# Patient Record
Sex: Female | Born: 1970
Health system: Southern US, Community
[De-identification: ages and names within clinical notes are randomized; demographics above are authoritative.]

## PROBLEM LIST (undated history)

## (undated) DIAGNOSIS — I2111 ST elevation (STEMI) myocardial infarction involving right coronary artery: Secondary | ICD-10-CM

## (undated) DIAGNOSIS — J302 Other seasonal allergic rhinitis: Secondary | ICD-10-CM

## (undated) DIAGNOSIS — M1990 Unspecified osteoarthritis, unspecified site: Secondary | ICD-10-CM

## (undated) DIAGNOSIS — I2510 Atherosclerotic heart disease of native coronary artery without angina pectoris: Secondary | ICD-10-CM

## (undated) DIAGNOSIS — I519 Heart disease, unspecified: Secondary | ICD-10-CM

## (undated) DIAGNOSIS — M51369 Other intervertebral disc degeneration, lumbar region without mention of lumbar back pain or lower extremity pain: Secondary | ICD-10-CM

## (undated) DIAGNOSIS — I10 Essential (primary) hypertension: Secondary | ICD-10-CM

## (undated) DIAGNOSIS — A4902 Methicillin resistant Staphylococcus aureus infection, unspecified site: Secondary | ICD-10-CM

## (undated) DIAGNOSIS — M5136 Other intervertebral disc degeneration, lumbar region: Secondary | ICD-10-CM

## (undated) DIAGNOSIS — J42 Unspecified chronic bronchitis: Secondary | ICD-10-CM

## (undated) DIAGNOSIS — E119 Type 2 diabetes mellitus without complications: Secondary | ICD-10-CM

## (undated) DIAGNOSIS — M352 Behcet's disease: Secondary | ICD-10-CM

## (undated) DIAGNOSIS — Z22322 Carrier or suspected carrier of Methicillin resistant Staphylococcus aureus: Secondary | ICD-10-CM

## (undated) DIAGNOSIS — K219 Gastro-esophageal reflux disease without esophagitis: Secondary | ICD-10-CM

## (undated) DIAGNOSIS — J189 Pneumonia, unspecified organism: Secondary | ICD-10-CM

## (undated) DIAGNOSIS — G43909 Migraine, unspecified, not intractable, without status migrainosus: Secondary | ICD-10-CM

## (undated) DIAGNOSIS — G8929 Other chronic pain: Secondary | ICD-10-CM

## (undated) DIAGNOSIS — I219 Acute myocardial infarction, unspecified: Secondary | ICD-10-CM

## (undated) DIAGNOSIS — E785 Hyperlipidemia, unspecified: Secondary | ICD-10-CM

## (undated) DIAGNOSIS — Z9981 Dependence on supplemental oxygen: Secondary | ICD-10-CM

## (undated) DIAGNOSIS — L0390 Cellulitis, unspecified: Secondary | ICD-10-CM

## (undated) DIAGNOSIS — M549 Dorsalgia, unspecified: Secondary | ICD-10-CM

## (undated) DIAGNOSIS — L88 Pyoderma gangrenosum: Secondary | ICD-10-CM

## (undated) HISTORY — PX: JOINT REPLACEMENT: SHX530

## (undated) HISTORY — PX: TONSILLECTOMY: SUR1361

## (undated) HISTORY — DX: Essential (primary) hypertension: I10

## (undated) HISTORY — PX: ECTOPIC PREGNANCY SURGERY: SHX613

## (undated) HISTORY — PX: TOTAL KNEE ARTHROPLASTY: SHX125

## (undated) HISTORY — DX: Methicillin resistant Staphylococcus aureus infection, unspecified site: A49.02

## (undated) HISTORY — DX: ST elevation (STEMI) myocardial infarction involving right coronary artery: I21.11

## (undated) HISTORY — DX: Hyperlipidemia, unspecified: E78.5

## (undated) HISTORY — DX: Atherosclerotic heart disease of native coronary artery without angina pectoris: I25.10

## (undated) HISTORY — DX: Carrier or suspected carrier of methicillin resistant Staphylococcus aureus: Z22.322

## (undated) HISTORY — DX: Heart disease, unspecified: I51.9

## (undated) HISTORY — PX: KNEE ARTHROSCOPY: SHX127

## (undated) HISTORY — PX: INCISION AND DRAINAGE: SHX5863

---

## 1999-11-12 ENCOUNTER — Encounter: Payer: Self-pay | Admitting: Pain Medicine

## 1999-11-12 ENCOUNTER — Ambulatory Visit (HOSPITAL_COMMUNITY): Admission: RE | Admit: 1999-11-12 | Discharge: 1999-11-12 | Payer: Self-pay | Admitting: Pain Medicine

## 2004-01-27 ENCOUNTER — Ambulatory Visit: Payer: Self-pay | Admitting: Pain Medicine

## 2004-02-24 ENCOUNTER — Ambulatory Visit: Payer: Self-pay | Admitting: Pain Medicine

## 2004-03-25 ENCOUNTER — Ambulatory Visit: Payer: Self-pay | Admitting: Pain Medicine

## 2004-04-27 ENCOUNTER — Ambulatory Visit: Payer: Self-pay | Admitting: Pain Medicine

## 2004-05-25 ENCOUNTER — Ambulatory Visit: Payer: Self-pay | Admitting: Pain Medicine

## 2004-06-24 ENCOUNTER — Ambulatory Visit: Payer: Self-pay | Admitting: Pain Medicine

## 2004-07-22 ENCOUNTER — Ambulatory Visit: Payer: Self-pay | Admitting: Pain Medicine

## 2004-08-24 ENCOUNTER — Ambulatory Visit: Payer: Self-pay | Admitting: Pain Medicine

## 2004-09-16 ENCOUNTER — Ambulatory Visit: Payer: Self-pay | Admitting: Pain Medicine

## 2004-10-21 ENCOUNTER — Ambulatory Visit: Payer: Self-pay | Admitting: Pain Medicine

## 2004-11-16 ENCOUNTER — Ambulatory Visit: Payer: Self-pay | Admitting: Pain Medicine

## 2004-12-07 ENCOUNTER — Ambulatory Visit: Payer: Self-pay | Admitting: Pain Medicine

## 2005-01-11 ENCOUNTER — Ambulatory Visit: Payer: Self-pay | Admitting: Pain Medicine

## 2005-02-08 ENCOUNTER — Ambulatory Visit: Payer: Self-pay | Admitting: Pain Medicine

## 2005-03-08 ENCOUNTER — Ambulatory Visit: Payer: Self-pay | Admitting: Pain Medicine

## 2005-03-16 ENCOUNTER — Ambulatory Visit: Payer: Self-pay | Admitting: Pain Medicine

## 2005-04-05 ENCOUNTER — Ambulatory Visit: Payer: Self-pay | Admitting: Pain Medicine

## 2005-04-18 HISTORY — PX: BILATERAL CARPAL TUNNEL RELEASE: SHX6508

## 2005-04-20 ENCOUNTER — Ambulatory Visit: Payer: Self-pay | Admitting: Pain Medicine

## 2005-05-26 ENCOUNTER — Ambulatory Visit: Payer: Self-pay | Admitting: Pain Medicine

## 2005-06-06 ENCOUNTER — Inpatient Hospital Stay: Payer: Self-pay | Admitting: Obstetrics and Gynecology

## 2005-06-28 ENCOUNTER — Ambulatory Visit: Payer: Self-pay | Admitting: Pain Medicine

## 2005-07-06 ENCOUNTER — Ambulatory Visit: Payer: Self-pay | Admitting: Pain Medicine

## 2005-08-02 ENCOUNTER — Ambulatory Visit: Payer: Self-pay | Admitting: Pain Medicine

## 2005-08-08 ENCOUNTER — Ambulatory Visit: Payer: Self-pay | Admitting: Pain Medicine

## 2005-08-30 ENCOUNTER — Ambulatory Visit: Payer: Self-pay | Admitting: Pain Medicine

## 2005-09-28 ENCOUNTER — Ambulatory Visit: Payer: Self-pay | Admitting: Neurosurgery

## 2005-09-29 ENCOUNTER — Ambulatory Visit: Payer: Self-pay | Admitting: Pain Medicine

## 2005-10-27 ENCOUNTER — Ambulatory Visit: Payer: Self-pay | Admitting: Pain Medicine

## 2005-11-11 ENCOUNTER — Inpatient Hospital Stay (HOSPITAL_COMMUNITY): Admission: RE | Admit: 2005-11-11 | Discharge: 2005-11-12 | Payer: Self-pay | Admitting: Neurosurgery

## 2005-12-07 ENCOUNTER — Encounter: Admission: RE | Admit: 2005-12-07 | Discharge: 2005-12-07 | Payer: Self-pay | Admitting: Neurosurgery

## 2006-01-31 ENCOUNTER — Ambulatory Visit: Payer: Self-pay | Admitting: Pain Medicine

## 2006-03-07 ENCOUNTER — Ambulatory Visit: Payer: Self-pay | Admitting: Pain Medicine

## 2006-03-28 ENCOUNTER — Ambulatory Visit: Payer: Self-pay | Admitting: Pain Medicine

## 2006-04-18 DIAGNOSIS — A4902 Methicillin resistant Staphylococcus aureus infection, unspecified site: Secondary | ICD-10-CM

## 2006-04-18 DIAGNOSIS — Z22322 Carrier or suspected carrier of Methicillin resistant Staphylococcus aureus: Secondary | ICD-10-CM

## 2006-04-18 HISTORY — PX: ANTERIOR CERVICAL DECOMP/DISCECTOMY FUSION: SHX1161

## 2006-04-18 HISTORY — DX: Methicillin resistant Staphylococcus aureus infection, unspecified site: A49.02

## 2006-04-18 HISTORY — DX: Carrier or suspected carrier of methicillin resistant Staphylococcus aureus: Z22.322

## 2006-05-01 ENCOUNTER — Ambulatory Visit: Payer: Self-pay | Admitting: Pain Medicine

## 2006-05-30 ENCOUNTER — Ambulatory Visit: Payer: Self-pay | Admitting: Pain Medicine

## 2006-06-29 ENCOUNTER — Ambulatory Visit: Payer: Self-pay | Admitting: Pain Medicine

## 2006-07-25 ENCOUNTER — Ambulatory Visit: Payer: Self-pay | Admitting: Pain Medicine

## 2006-08-04 IMAGING — RF DG CERVICAL SPINE 1V
1 series · 1 of 1 positions shown · non-contrast
Comparison: None

CLINICAL DATA: C5-C6, C6-C7 plate

CERVICAL SPINE - 1 VIEW

[Series 1: run · 1 of 1 slices shown]
[im 1/1]
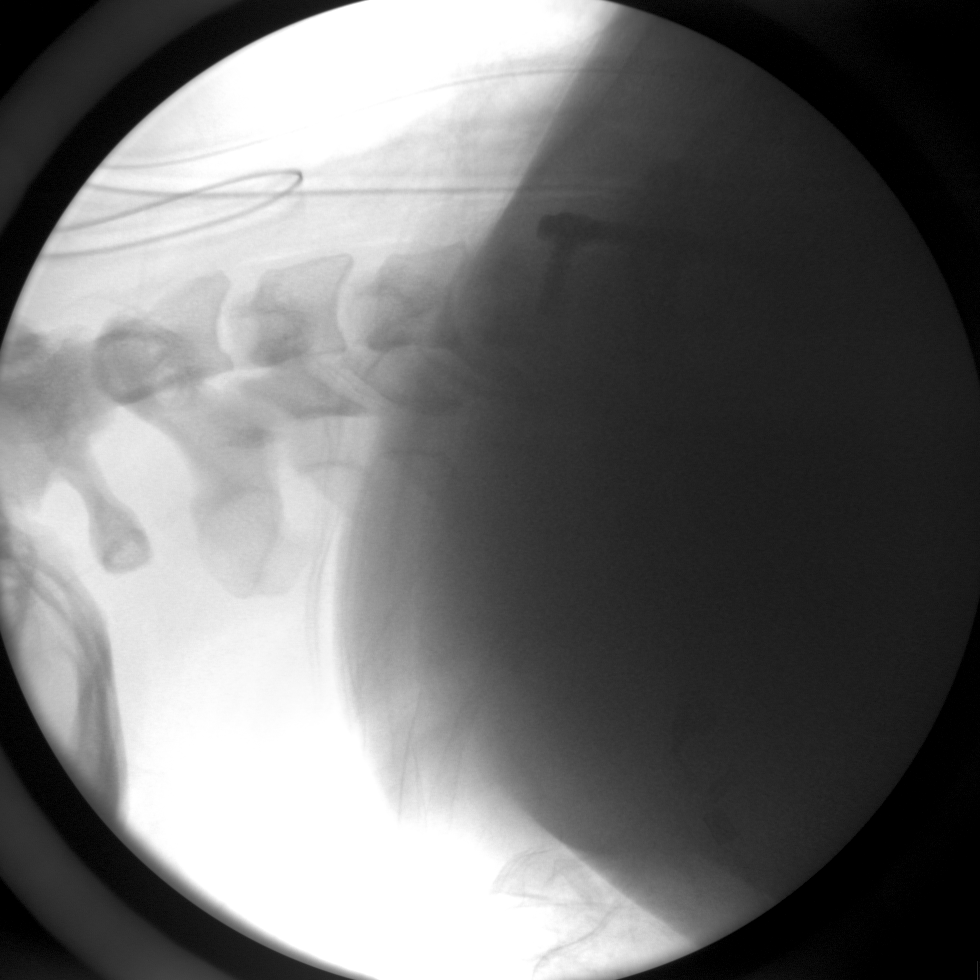

[1 of 1 positions shown; findings below may reference images not displayed]

FINDINGS: Patient status post anterior fusion. Plate is seen at C5 level. The
C6 and C7 level are obscured.

IMPRESSION

ACDF, presumably C5-C7. Below the C5 level is obscured by overlying shoulders.

## 2006-08-22 ENCOUNTER — Ambulatory Visit: Payer: Self-pay | Admitting: Pain Medicine

## 2006-08-29 ENCOUNTER — Ambulatory Visit (HOSPITAL_COMMUNITY): Admission: RE | Admit: 2006-08-29 | Discharge: 2006-08-29 | Payer: Self-pay | Admitting: Neurosurgery

## 2006-09-28 ENCOUNTER — Ambulatory Visit: Payer: Self-pay | Admitting: Pain Medicine

## 2006-10-31 ENCOUNTER — Ambulatory Visit: Payer: Self-pay | Admitting: Pain Medicine

## 2006-11-30 ENCOUNTER — Ambulatory Visit: Payer: Self-pay | Admitting: Pain Medicine

## 2006-12-19 ENCOUNTER — Ambulatory Visit: Payer: Self-pay | Admitting: Pain Medicine

## 2007-01-30 ENCOUNTER — Ambulatory Visit: Payer: Self-pay | Admitting: Pain Medicine

## 2007-03-01 ENCOUNTER — Ambulatory Visit: Payer: Self-pay | Admitting: Pain Medicine

## 2007-04-30 ENCOUNTER — Ambulatory Visit: Payer: Self-pay | Admitting: Pain Medicine

## 2007-05-29 ENCOUNTER — Ambulatory Visit: Payer: Self-pay | Admitting: Pain Medicine

## 2007-06-26 ENCOUNTER — Ambulatory Visit: Payer: Self-pay | Admitting: Pain Medicine

## 2007-07-24 ENCOUNTER — Ambulatory Visit: Payer: Self-pay | Admitting: Pain Medicine

## 2007-08-28 ENCOUNTER — Ambulatory Visit: Payer: Self-pay | Admitting: Pain Medicine

## 2007-09-27 ENCOUNTER — Ambulatory Visit: Payer: Self-pay | Admitting: Pain Medicine

## 2007-10-25 ENCOUNTER — Ambulatory Visit: Payer: Self-pay | Admitting: Pain Medicine

## 2007-11-27 ENCOUNTER — Ambulatory Visit: Payer: Self-pay | Admitting: Pain Medicine

## 2007-12-25 ENCOUNTER — Ambulatory Visit: Payer: Self-pay | Admitting: Pain Medicine

## 2008-01-22 ENCOUNTER — Ambulatory Visit: Payer: Self-pay | Admitting: Pain Medicine

## 2008-02-21 ENCOUNTER — Ambulatory Visit: Payer: Self-pay | Admitting: Pain Medicine

## 2008-03-20 ENCOUNTER — Ambulatory Visit: Payer: Self-pay | Admitting: Pain Medicine

## 2008-04-22 ENCOUNTER — Ambulatory Visit: Payer: Self-pay | Admitting: Pain Medicine

## 2008-05-22 ENCOUNTER — Ambulatory Visit: Payer: Self-pay | Admitting: Pain Medicine

## 2008-06-17 ENCOUNTER — Ambulatory Visit: Payer: Self-pay | Admitting: Pain Medicine

## 2008-07-17 ENCOUNTER — Ambulatory Visit: Payer: Self-pay | Admitting: Pain Medicine

## 2008-08-14 ENCOUNTER — Ambulatory Visit: Payer: Self-pay | Admitting: Pain Medicine

## 2008-09-11 ENCOUNTER — Ambulatory Visit: Payer: Self-pay | Admitting: Pain Medicine

## 2008-11-11 ENCOUNTER — Ambulatory Visit: Payer: Self-pay | Admitting: Pain Medicine

## 2008-12-11 ENCOUNTER — Ambulatory Visit: Payer: Self-pay | Admitting: Pain Medicine

## 2009-01-13 ENCOUNTER — Ambulatory Visit: Payer: Self-pay | Admitting: Pain Medicine

## 2009-02-12 ENCOUNTER — Ambulatory Visit: Payer: Self-pay | Admitting: Pain Medicine

## 2009-04-06 ENCOUNTER — Ambulatory Visit: Payer: Self-pay | Admitting: Pain Medicine

## 2009-06-04 ENCOUNTER — Ambulatory Visit: Payer: Self-pay | Admitting: Pain Medicine

## 2009-09-01 ENCOUNTER — Ambulatory Visit: Payer: Self-pay | Admitting: Pain Medicine

## 2009-10-01 ENCOUNTER — Ambulatory Visit: Payer: Self-pay | Admitting: Pain Medicine

## 2009-10-29 ENCOUNTER — Ambulatory Visit: Payer: Self-pay | Admitting: Pain Medicine

## 2009-12-28 ENCOUNTER — Ambulatory Visit: Payer: Self-pay | Admitting: Pain Medicine

## 2010-01-28 ENCOUNTER — Ambulatory Visit: Payer: Self-pay | Admitting: Pain Medicine

## 2010-04-27 ENCOUNTER — Ambulatory Visit: Payer: Self-pay | Admitting: Pain Medicine

## 2010-05-27 ENCOUNTER — Ambulatory Visit: Payer: Self-pay | Admitting: Pain Medicine

## 2010-06-24 ENCOUNTER — Ambulatory Visit: Payer: Self-pay | Admitting: Pain Medicine

## 2010-07-21 ENCOUNTER — Ambulatory Visit: Payer: Self-pay | Admitting: Pain Medicine

## 2010-09-21 ENCOUNTER — Ambulatory Visit: Payer: Self-pay | Admitting: Pain Medicine

## 2010-10-19 ENCOUNTER — Ambulatory Visit: Payer: Self-pay | Admitting: Pain Medicine

## 2010-11-18 ENCOUNTER — Ambulatory Visit: Payer: Self-pay | Admitting: Pain Medicine

## 2010-12-21 ENCOUNTER — Ambulatory Visit: Payer: Self-pay | Admitting: Pain Medicine

## 2011-01-18 ENCOUNTER — Ambulatory Visit: Payer: Self-pay | Admitting: Pain Medicine

## 2011-02-17 ENCOUNTER — Ambulatory Visit: Payer: Self-pay | Admitting: Pain Medicine

## 2011-03-17 ENCOUNTER — Ambulatory Visit: Payer: Self-pay | Admitting: Pain Medicine

## 2011-04-21 ENCOUNTER — Ambulatory Visit: Payer: Self-pay | Admitting: Pain Medicine

## 2011-05-19 ENCOUNTER — Ambulatory Visit: Payer: Self-pay | Admitting: Pain Medicine

## 2011-06-16 ENCOUNTER — Ambulatory Visit: Payer: Self-pay | Admitting: Pain Medicine

## 2011-07-14 ENCOUNTER — Ambulatory Visit: Payer: Self-pay | Admitting: Pain Medicine

## 2011-08-16 ENCOUNTER — Ambulatory Visit: Payer: Self-pay | Admitting: Pain Medicine

## 2011-09-08 ENCOUNTER — Ambulatory Visit: Payer: Self-pay | Admitting: Pain Medicine

## 2011-10-13 ENCOUNTER — Ambulatory Visit: Payer: Self-pay | Admitting: Pain Medicine

## 2011-11-10 ENCOUNTER — Ambulatory Visit: Payer: Self-pay | Admitting: Pain Medicine

## 2011-12-15 ENCOUNTER — Ambulatory Visit: Payer: Self-pay | Admitting: Pain Medicine

## 2012-01-17 ENCOUNTER — Ambulatory Visit: Payer: Self-pay | Admitting: Pain Medicine

## 2012-02-14 ENCOUNTER — Ambulatory Visit: Payer: Self-pay | Admitting: Pain Medicine

## 2012-03-19 ENCOUNTER — Ambulatory Visit: Payer: Self-pay | Admitting: Pain Medicine

## 2012-04-16 ENCOUNTER — Ambulatory Visit: Payer: Self-pay | Admitting: Pain Medicine

## 2012-05-17 ENCOUNTER — Ambulatory Visit: Payer: Self-pay | Admitting: Pain Medicine

## 2012-06-14 ENCOUNTER — Ambulatory Visit: Payer: Self-pay | Admitting: Pain Medicine

## 2012-07-12 ENCOUNTER — Ambulatory Visit: Payer: Self-pay | Admitting: Pain Medicine

## 2012-08-09 ENCOUNTER — Ambulatory Visit: Payer: Self-pay | Admitting: Pain Medicine

## 2012-09-11 ENCOUNTER — Ambulatory Visit: Payer: Self-pay | Admitting: Pain Medicine

## 2012-10-11 ENCOUNTER — Ambulatory Visit: Payer: Self-pay | Admitting: Pain Medicine

## 2012-11-06 ENCOUNTER — Ambulatory Visit: Payer: Self-pay | Admitting: Pain Medicine

## 2013-01-08 ENCOUNTER — Ambulatory Visit: Payer: Self-pay | Admitting: Pain Medicine

## 2013-02-07 ENCOUNTER — Ambulatory Visit: Payer: Self-pay | Admitting: Pain Medicine

## 2013-03-07 ENCOUNTER — Ambulatory Visit: Payer: Self-pay | Admitting: Pain Medicine

## 2013-04-04 ENCOUNTER — Ambulatory Visit: Payer: Self-pay | Admitting: Pain Medicine

## 2013-05-06 ENCOUNTER — Ambulatory Visit: Payer: Self-pay | Admitting: Pain Medicine

## 2013-06-06 ENCOUNTER — Ambulatory Visit: Payer: Self-pay | Admitting: Pain Medicine

## 2013-07-04 ENCOUNTER — Ambulatory Visit: Payer: Self-pay | Admitting: Pain Medicine

## 2013-08-06 ENCOUNTER — Ambulatory Visit: Payer: Self-pay | Admitting: Pain Medicine

## 2013-09-05 ENCOUNTER — Ambulatory Visit: Payer: Self-pay | Admitting: Pain Medicine

## 2013-10-03 ENCOUNTER — Ambulatory Visit: Payer: Self-pay | Admitting: Pain Medicine

## 2013-10-31 ENCOUNTER — Ambulatory Visit: Payer: Self-pay | Admitting: Pain Medicine

## 2013-12-05 ENCOUNTER — Ambulatory Visit: Payer: Self-pay | Admitting: Pain Medicine

## 2014-01-01 ENCOUNTER — Ambulatory Visit: Payer: Self-pay | Admitting: Pain Medicine

## 2014-02-03 ENCOUNTER — Ambulatory Visit: Payer: Self-pay | Admitting: Pain Medicine

## 2014-03-06 ENCOUNTER — Ambulatory Visit: Payer: Self-pay | Admitting: Pain Medicine

## 2014-04-01 ENCOUNTER — Ambulatory Visit: Payer: Self-pay | Admitting: Pain Medicine

## 2014-05-06 ENCOUNTER — Ambulatory Visit: Payer: Self-pay | Admitting: Pain Medicine

## 2014-05-06 DIAGNOSIS — M47812 Spondylosis without myelopathy or radiculopathy, cervical region: Secondary | ICD-10-CM | POA: Diagnosis not present

## 2014-05-06 DIAGNOSIS — M5416 Radiculopathy, lumbar region: Secondary | ICD-10-CM | POA: Diagnosis not present

## 2014-05-06 DIAGNOSIS — M47817 Spondylosis without myelopathy or radiculopathy, lumbosacral region: Secondary | ICD-10-CM | POA: Diagnosis not present

## 2014-05-06 DIAGNOSIS — M533 Sacrococcygeal disorders, not elsewhere classified: Secondary | ICD-10-CM | POA: Diagnosis not present

## 2014-07-03 ENCOUNTER — Ambulatory Visit: Payer: Self-pay | Admitting: Pain Medicine

## 2014-07-03 DIAGNOSIS — M47812 Spondylosis without myelopathy or radiculopathy, cervical region: Secondary | ICD-10-CM | POA: Diagnosis not present

## 2014-07-03 DIAGNOSIS — M47817 Spondylosis without myelopathy or radiculopathy, lumbosacral region: Secondary | ICD-10-CM | POA: Diagnosis not present

## 2014-07-03 DIAGNOSIS — M533 Sacrococcygeal disorders, not elsewhere classified: Secondary | ICD-10-CM | POA: Diagnosis not present

## 2014-07-03 DIAGNOSIS — M5416 Radiculopathy, lumbar region: Secondary | ICD-10-CM | POA: Diagnosis not present

## 2014-08-31 ENCOUNTER — Encounter: Payer: Self-pay | Admitting: Pain Medicine

## 2014-08-31 DIAGNOSIS — M5134 Other intervertebral disc degeneration, thoracic region: Secondary | ICD-10-CM | POA: Insufficient documentation

## 2014-08-31 DIAGNOSIS — M48062 Spinal stenosis, lumbar region with neurogenic claudication: Secondary | ICD-10-CM

## 2014-08-31 DIAGNOSIS — M961 Postlaminectomy syndrome, not elsewhere classified: Secondary | ICD-10-CM

## 2014-08-31 DIAGNOSIS — M5030 Other cervical disc degeneration, unspecified cervical region: Secondary | ICD-10-CM

## 2014-08-31 DIAGNOSIS — M51369 Other intervertebral disc degeneration, lumbar region without mention of lumbar back pain or lower extremity pain: Secondary | ICD-10-CM

## 2014-08-31 DIAGNOSIS — M5136 Other intervertebral disc degeneration, lumbar region: Secondary | ICD-10-CM | POA: Insufficient documentation

## 2014-09-01 ENCOUNTER — Ambulatory Visit: Payer: Medicaid Other | Attending: Pain Medicine | Admitting: Pain Medicine

## 2014-09-01 ENCOUNTER — Encounter: Payer: Self-pay | Admitting: Pain Medicine

## 2014-09-01 VITALS — HR 96 | Temp 98.7°F | Resp 17 | Ht 61.0 in | Wt 215.0 lb

## 2014-09-01 DIAGNOSIS — M5022 Other cervical disc displacement, mid-cervical region: Secondary | ICD-10-CM | POA: Diagnosis not present

## 2014-09-01 DIAGNOSIS — Z8614 Personal history of Methicillin resistant Staphylococcus aureus infection: Secondary | ICD-10-CM | POA: Insufficient documentation

## 2014-09-01 DIAGNOSIS — M5136 Other intervertebral disc degeneration, lumbar region: Secondary | ICD-10-CM | POA: Diagnosis not present

## 2014-09-01 DIAGNOSIS — M961 Postlaminectomy syndrome, not elsewhere classified: Secondary | ICD-10-CM

## 2014-09-01 DIAGNOSIS — M47817 Spondylosis without myelopathy or radiculopathy, lumbosacral region: Secondary | ICD-10-CM | POA: Diagnosis not present

## 2014-09-01 DIAGNOSIS — R52 Pain, unspecified: Secondary | ICD-10-CM | POA: Diagnosis present

## 2014-09-01 DIAGNOSIS — M48061 Spinal stenosis, lumbar region without neurogenic claudication: Secondary | ICD-10-CM

## 2014-09-01 DIAGNOSIS — M5030 Other cervical disc degeneration, unspecified cervical region: Secondary | ICD-10-CM

## 2014-09-01 DIAGNOSIS — M533 Sacrococcygeal disorders, not elsewhere classified: Secondary | ICD-10-CM | POA: Diagnosis not present

## 2014-09-01 DIAGNOSIS — A4902 Methicillin resistant Staphylococcus aureus infection, unspecified site: Secondary | ICD-10-CM | POA: Insufficient documentation

## 2014-09-01 DIAGNOSIS — M47812 Spondylosis without myelopathy or radiculopathy, cervical region: Secondary | ICD-10-CM | POA: Diagnosis not present

## 2014-09-01 DIAGNOSIS — M47816 Spondylosis without myelopathy or radiculopathy, lumbar region: Secondary | ICD-10-CM

## 2014-09-01 DIAGNOSIS — M5416 Radiculopathy, lumbar region: Secondary | ICD-10-CM | POA: Diagnosis not present

## 2014-09-01 MED ORDER — MORPHINE SULFATE ER 60 MG PO TBCR: EXTENDED_RELEASE_TABLET | ORAL | Status: DC

## 2014-09-01 MED ORDER — OXYCODONE HCL 20 MG PO TABS: ORAL_TABLET | ORAL | Status: DC

## 2014-09-01 NOTE — Progress Notes (Signed)
   Subjective:    Patient ID: Brittney Banks, female    DOB: 1970/04/25, 44 y.o.   MRN: [id number]  HPI  Patient is 44 year old female [facility] and treatment of pain involving multiple regions with recent MRSA infection. Patient with history of MRSA infection and recent exacerbation of her MRSA. We advised patient follow-up with Dr. Manson Passey at this time and to reconsider infectious disease consultation as well the patient was understanding and will follow-up Dr. Manson Passey this week as discussed. We will avoid interventional treatment and patient also will is unable to undergo surgery of the lumbar region with Dr. Dutch Quint due to her MRSA infection. Patient with prior surgery of the cervical region. Patient will follow up with Dr. Manson Passey as discussed and will call [facility] should there be any change in plan. Understanding and in agreement to treatment plan  Review of Systems     Objective:   Physical Exam   There was severe tenderness of the paraspinal musculature region cervical region with tenderness over the trapezius levator scapula and rhomboid musculature regions and there was limited range of motion of the cervical spine. A solution of the acromioclavicular glenohumeral joint regions with tenderness to palpation with decreased grip strength noted of the upper extremities palpation of the thoracic facet thoracic paraspinal musculature region reproduced pain of moderate degree with moderate tenderness over the lumbar paraspinal musculature region lumbar facet region as well as the gluteal and piriformis musculature regions. Straight leg raising limited to approximately 20 evidence of lesions of the extremities noted EHL strength decreased with tenderness of the greater trochanteric region and iliotibial band region noted as well. Abdomen tends to palpation rebound tenderness noted and no costovertebral angle tenderness noted     Assessment &  Plan:    Degenerative disc disease cervical spine C5-6 C6-7 degenerative changes with annular disc bulging and asymmetric bulging toward the right and associated endplate bar formation resulting in narrowing of the neural foramen at several levels   Degenerative changes lumbar spine Facet hypertrophy, ligamentum flavum hypertrophy spinal stenosis  MRSA    Plan  Continue present medications oxycodone OxyContin.  F/U PCP for evaliation of  BP and general medical  condition.. Patient to see Dr. Manson Passey this week for evaluation of recent MRSA infection and consider infectious disease consultation   F/U surgical evaluation.  F/U nrurological evaluation.  May consider radiofrequency rhizolysis or intraspinal procedures pending response to present treatment and F/U evaluation.  [facility] to call [facility] should patient have concerns prior to scheduled return appointment.

## 2014-09-01 NOTE — Patient Instructions (Addendum)
Continue present medications.  F/U PCP for evaliation of  BP and general medical  condition and MRSA. Please see Dr. Manson Passey this week  F/U surgical evaluation.  F/U nrurological evaluation.  May consider radiofrequency rhizolysis or intraspinal procedures pending response to present treatment and F/U evaluation.  [facility] to call [facility] should patient have concerns prior to scheduled return appointment.

## 2014-09-01 NOTE — Progress Notes (Signed)
   Subjective:    Patient ID: Brittney Banks, female    DOB: [birth year], 44 y.o.   MRN: [id number]  HPI    Review of Systems     Objective:   Physical Exam        Assessment & Plan:

## 2014-09-01 NOTE — Progress Notes (Signed)
DISCHARGE HOME AT 1506 HRS PT AMBULATORY TEACH BACK 3 DONE SCRIPTS OF MS CONTIN 60MG  AND OXYCODONE 20MG  GIVEN RETURN IN ONE MONTH FOR EVAL

## 2014-09-09 ENCOUNTER — Other Ambulatory Visit: Payer: Self-pay | Admitting: Pain Medicine

## 2014-10-02 ENCOUNTER — Encounter: Payer: Medicare Other | Admitting: Pain Medicine

## 2014-10-02 ENCOUNTER — Other Ambulatory Visit: Payer: Self-pay | Admitting: Pain Medicine

## 2014-10-02 ENCOUNTER — Telehealth: Payer: Self-pay | Admitting: Pain Medicine

## 2014-10-02 MED ORDER — MORPHINE SULFATE ER 60 MG PO TBCR: EXTENDED_RELEASE_TABLET | ORAL | Status: DC

## 2014-10-02 MED ORDER — OXYCODONE HCL 20 MG PO TABS: ORAL_TABLET | ORAL | Status: DC

## 2014-10-02 NOTE — Telephone Encounter (Signed)
Pt has been notified.

## 2014-10-02 NOTE — Telephone Encounter (Signed)
Tea is running a fever and has new breakouts do you still want her to come in or can her mother pick up scripts this afternoon ?

## 2014-10-02 NOTE — Telephone Encounter (Signed)
Brittney Banks, and Nurses  Patient may have a responsible adult, and pick up her prescriptions today. Also instructed patient to see her primary care physician today for evaluation of her medical condition if patient has not already seen her primary care physician.

## 2014-10-09 ENCOUNTER — Other Ambulatory Visit: Payer: Self-pay | Admitting: Pain Medicine

## 2014-10-29 ENCOUNTER — Encounter: Payer: Self-pay | Admitting: Pain Medicine

## 2014-10-29 ENCOUNTER — Ambulatory Visit: Payer: Medicaid Other | Attending: Pain Medicine | Admitting: Pain Medicine

## 2014-10-29 VITALS — BP 156/91 | HR 91 | Temp 98.3°F | Resp 16 | Ht 69.0 in | Wt 212.0 lb

## 2014-10-29 DIAGNOSIS — M5481 Occipital neuralgia: Secondary | ICD-10-CM | POA: Insufficient documentation

## 2014-10-29 DIAGNOSIS — M79602 Pain in left arm: Secondary | ICD-10-CM | POA: Diagnosis present

## 2014-10-29 DIAGNOSIS — Z9889 Other specified postprocedural states: Secondary | ICD-10-CM | POA: Diagnosis not present

## 2014-10-29 DIAGNOSIS — M47812 Spondylosis without myelopathy or radiculopathy, cervical region: Secondary | ICD-10-CM | POA: Diagnosis not present

## 2014-10-29 DIAGNOSIS — M533 Sacrococcygeal disorders, not elsewhere classified: Secondary | ICD-10-CM | POA: Diagnosis not present

## 2014-10-29 DIAGNOSIS — M79601 Pain in right arm: Secondary | ICD-10-CM | POA: Diagnosis present

## 2014-10-29 DIAGNOSIS — M5134 Other intervertebral disc degeneration, thoracic region: Secondary | ICD-10-CM

## 2014-10-29 DIAGNOSIS — M47817 Spondylosis without myelopathy or radiculopathy, lumbosacral region: Secondary | ICD-10-CM | POA: Diagnosis not present

## 2014-10-29 DIAGNOSIS — M5416 Radiculopathy, lumbar region: Secondary | ICD-10-CM | POA: Diagnosis not present

## 2014-10-29 DIAGNOSIS — M961 Postlaminectomy syndrome, not elsewhere classified: Secondary | ICD-10-CM

## 2014-10-29 DIAGNOSIS — M542 Cervicalgia: Secondary | ICD-10-CM | POA: Diagnosis present

## 2014-10-29 DIAGNOSIS — A4902 Methicillin resistant Staphylococcus aureus infection, unspecified site: Secondary | ICD-10-CM

## 2014-10-29 DIAGNOSIS — M4806 Spinal stenosis, lumbar region: Secondary | ICD-10-CM | POA: Diagnosis not present

## 2014-10-29 DIAGNOSIS — M5136 Other intervertebral disc degeneration, lumbar region: Secondary | ICD-10-CM | POA: Diagnosis not present

## 2014-10-29 DIAGNOSIS — M51369 Other intervertebral disc degeneration, lumbar region without mention of lumbar back pain or lower extremity pain: Secondary | ICD-10-CM

## 2014-10-29 DIAGNOSIS — M48062 Spinal stenosis, lumbar region with neurogenic claudication: Secondary | ICD-10-CM

## 2014-10-29 DIAGNOSIS — M5030 Other cervical disc degeneration, unspecified cervical region: Secondary | ICD-10-CM | POA: Diagnosis not present

## 2014-10-29 MED ORDER — MORPHINE SULFATE ER 60 MG PO TBCR: EXTENDED_RELEASE_TABLET | ORAL | Status: DC

## 2014-10-29 MED ORDER — OXYCODONE HCL 20 MG PO TABS: ORAL_TABLET | ORAL | Status: DC

## 2014-10-29 NOTE — Progress Notes (Signed)
   Subjective:    Patient ID: Brittney Banks, female    DOB: 1971/03/05, 44 y.o.   MRN: [id number]  HPI Patient is a 44 year old female [facility] further evaluation and treatment of pain involving the neck upper extremity regions mid back lower back and lower extremity regions. Patient denies any recent trauma. Patient states that she has had reoccurrence of MRSA outbreak. Patient is being evaluated and treated by Dr. Manson Passey and is being seen by home healthcare nurse at her residence. Patient will continue antibiotic treatment and other treatment at this time. We will also discussed patient proceeding with infectious disease consult. Patient will address this with Dr. Manson Passey and consider infectious disease consult as we discussed. Patient denies any other changes in condition. We will continue MS Contin and oxycodone at the present time. Patient states that the present medication regimen is adequate for controlling her pain. Patient is understanding and agreement status treatment plan.      Review of Systems     Objective:   Physical Exam  There was tenderness of the splenius capitis and occipitalis musculature region of mild to moderate degree. Patient appeared to be with unremarkable Spurling's maneuver. There was well-healed surgical scar of the cervical region without increased warmth or erythema in the region of the scar. Patient was with unremarkable Spurling's maneuver. Palpation over the thoracic facet thoracic paraspinal musculature region was a tends to palpation. No crepitus of the thoracic region was noted. Tinel and Phalen's maneuver were without increase of pain of significant degree. No definite sensory deficit of dermatomal distribution of the upper extremities noted. Palpation over the lumbar paraspinal muscle lumbar facet region associated with moderate moderately severe discomfort. There was tenderness over the greater trochanteric region iliotibial band  region. Straight leg raising tolerated to  approximately 10.  Negative clonus negative Homans. Patient with evidence of lesions of the extremities and trunk on today's visit. Abdomen with without excessive tends to palpation. No costovertebral angle tenderness noted.      Assessment & Plan:   Degenerative disc disease cervical spine Status post surgery cervical region  Degenerative disc disease lumbar spine Lumbar facet syndrome Lumbar stenosis  Sacroiliac joint dysfunction  Bilateral occipital neuralgia  MRSA   Plan   Continue present medications MS Contin and oxycodone  F/U PCP Dr. Manson Passey for evaliation of  BP MRSA and general medical  condition and to discuss infectious disease consultation  F/U surgical evaluation Dr. Dutch Quint for neurosurgical reevaluation as needed  F/U neurological evaluation  May consider radiofrequency rhizolysis or intraspinal procedures pending response to present treatment and F/U evaluation.  [facility] to [facility] should patient have concerns prior to scheduled return appointment.

## 2014-10-29 NOTE — Patient Instructions (Addendum)
Continue present medications MS Contin and oxycodone   F/U PCP Dr. Manson Passey for evaliation of  BP, MRSA, and general medical  condition as discussed and consider infectious disease consultation as discussed. Continue home health care nursing for treatment of MRSA as presently doing  F/U surgical evaluation  F/U neurological evaluation  May consider radiofrequency rhizolysis or intraspinal procedures pending response to present treatment and F/U evaluation.  [facility] to call [facility] should patient have concerns prior to scheduled return appointment.

## 2014-10-29 NOTE — Progress Notes (Signed)
Safety precautions to be maintained throughout the outpatient stay will include: orient to surroundings, keep bed in low position, maintain call bell within reach at all times, provide assistance with transfer out of bed and ambulation.  

## 2014-10-29 NOTE — Progress Notes (Signed)
Discharged to home ambulatory with script in hand for oxycodone and MS Contin.  Follow up in 1 month.

## 2014-11-13 ENCOUNTER — Other Ambulatory Visit: Payer: Self-pay | Admitting: Pain Medicine

## 2014-12-02 ENCOUNTER — Encounter: Payer: Medicare Other | Admitting: Pain Medicine

## 2014-12-02 ENCOUNTER — Other Ambulatory Visit: Payer: Self-pay | Admitting: Pain Medicine

## 2014-12-02 ENCOUNTER — Telehealth: Payer: Self-pay | Admitting: Pain Medicine

## 2014-12-02 MED ORDER — MORPHINE SULFATE ER 60 MG PO TBCR: EXTENDED_RELEASE_TABLET | ORAL | Status: DC

## 2014-12-02 MED ORDER — OXYCODONE HCL 20 MG PO TABS: ORAL_TABLET | ORAL | Status: DC

## 2014-12-02 NOTE — Telephone Encounter (Signed)
patient states that she is having some issues with the new antibiotic she has been prescribed. It has caused nausea and some vommittng. She has already contacted her primary care and they have prescribed her some phenergan. She states there are no problems with the medications Dr. Metta Clines prescribes. Can patient's family member pick up scripts today?

## 2014-12-02 NOTE — Telephone Encounter (Signed)
Pt has an apt today , but has a bad outbreak out now and having medication complications. Would like to know if her mother can pick up her scripts today.

## 2014-12-02 NOTE — Telephone Encounter (Signed)
Nurses and [name] Please call patient and explained to me what patient means by " medication complications" before we prescribed medications today. Yes the patient's mother may pick up the morphine sulfate extended-release and oxycodone prescriptions if there are no medication complications related to these medications. Patient should see Dr. Manson Passey her primary care physician immediately for evaluation of her condition of medication complications

## 2014-12-02 NOTE — Telephone Encounter (Signed)
Patient called and informed that scripts are ready to be picked up.

## 2015-01-01 ENCOUNTER — Encounter: Payer: Self-pay | Admitting: Pain Medicine

## 2015-01-01 ENCOUNTER — Ambulatory Visit: Payer: Medicaid Other | Attending: Pain Medicine | Admitting: Pain Medicine

## 2015-01-01 VITALS — BP 141/88 | HR 94 | Temp 96.6°F | Resp 16 | Ht 69.0 in | Wt 212.0 lb

## 2015-01-01 DIAGNOSIS — M79602 Pain in left arm: Secondary | ICD-10-CM | POA: Diagnosis present

## 2015-01-01 DIAGNOSIS — M542 Cervicalgia: Secondary | ICD-10-CM | POA: Diagnosis present

## 2015-01-01 DIAGNOSIS — M47812 Spondylosis without myelopathy or radiculopathy, cervical region: Secondary | ICD-10-CM | POA: Diagnosis not present

## 2015-01-01 DIAGNOSIS — M5416 Radiculopathy, lumbar region: Secondary | ICD-10-CM | POA: Diagnosis not present

## 2015-01-01 DIAGNOSIS — M5481 Occipital neuralgia: Secondary | ICD-10-CM | POA: Insufficient documentation

## 2015-01-01 DIAGNOSIS — A4902 Methicillin resistant Staphylococcus aureus infection, unspecified site: Secondary | ICD-10-CM

## 2015-01-01 DIAGNOSIS — M5136 Other intervertebral disc degeneration, lumbar region: Secondary | ICD-10-CM | POA: Diagnosis not present

## 2015-01-01 DIAGNOSIS — M48062 Spinal stenosis, lumbar region with neurogenic claudication: Secondary | ICD-10-CM

## 2015-01-01 DIAGNOSIS — Z8614 Personal history of Methicillin resistant Staphylococcus aureus infection: Secondary | ICD-10-CM | POA: Diagnosis not present

## 2015-01-01 DIAGNOSIS — M533 Sacrococcygeal disorders, not elsewhere classified: Secondary | ICD-10-CM | POA: Diagnosis not present

## 2015-01-01 DIAGNOSIS — M5030 Other cervical disc degeneration, unspecified cervical region: Secondary | ICD-10-CM | POA: Diagnosis not present

## 2015-01-01 DIAGNOSIS — Z9889 Other specified postprocedural states: Secondary | ICD-10-CM | POA: Insufficient documentation

## 2015-01-01 DIAGNOSIS — M961 Postlaminectomy syndrome, not elsewhere classified: Secondary | ICD-10-CM

## 2015-01-01 DIAGNOSIS — M48061 Spinal stenosis, lumbar region without neurogenic claudication: Secondary | ICD-10-CM

## 2015-01-01 DIAGNOSIS — M47817 Spondylosis without myelopathy or radiculopathy, lumbosacral region: Secondary | ICD-10-CM | POA: Diagnosis not present

## 2015-01-01 DIAGNOSIS — M4806 Spinal stenosis, lumbar region: Secondary | ICD-10-CM | POA: Insufficient documentation

## 2015-01-01 DIAGNOSIS — M79601 Pain in right arm: Secondary | ICD-10-CM | POA: Diagnosis present

## 2015-01-01 DIAGNOSIS — M5134 Other intervertebral disc degeneration, thoracic region: Secondary | ICD-10-CM

## 2015-01-01 DIAGNOSIS — M47816 Spondylosis without myelopathy or radiculopathy, lumbar region: Secondary | ICD-10-CM

## 2015-01-01 DIAGNOSIS — M51369 Other intervertebral disc degeneration, lumbar region without mention of lumbar back pain or lower extremity pain: Secondary | ICD-10-CM

## 2015-01-01 MED ORDER — OXYCODONE HCL 20 MG PO TABS: ORAL_TABLET | ORAL | Status: DC

## 2015-01-01 MED ORDER — MORPHINE SULFATE ER 60 MG PO TBCR: EXTENDED_RELEASE_TABLET | ORAL | Status: DC

## 2015-01-01 NOTE — Patient Instructions (Signed)
PLAN   Continue present medication oxycodone and MS Contin   F/U PCP Dr. Manson Passey  for evaliation of MRSA, infectious disease consultation, BP, and general medical  condition  F/U surgical evaluation. Follow-up with Dr. Dutch Quint as discussed  F/U neurological evaluation. May consider pending follow-up evaluations  May consider radiofrequency rhizolysis or intraspinal procedures pending response to present treatment and F/U evaluation   [facility] to call [facility] should patient have concerns prior to scheduled return appointment.

## 2015-01-01 NOTE — Progress Notes (Signed)
Safety precautions to be maintained throughout the outpatient stay will include: orient to surroundings, keep bed in low position, maintain call bell within reach at all times, provide assistance with transfer out of bed and ambulation.  

## 2015-01-01 NOTE — Progress Notes (Signed)
   Subjective:    Patient ID: Brittney Banks, female    DOB: 01-06-1971, 44 y.o.   MRN: [id number]  HPI  Patient 44 year old female [facility] further evaluation and treatment of pain involving neck upper extremity regions lower back and lower extremity regions. Patient is with prior surgical intervention of the cervical region. Patient is unable to undergo surgery of the lumbar region due to reoccurring MRSA infections. Patient will follow-up with Dr. Dutch Quint as discussed and will follow-up with Dr. Manson Passey primary care physician to consider infectious disease consultation as discussed with patient and patient's mother on today's visit. We will continue oxycodone and MS Contin as prescribed at this time. Patient was understanding and agreed suggested treatment plan.     Review of Systems     Objective:   Physical Exam  There was tends to palpation of the splenius capitis and occipitalis musculature region with well-healed surgical scar the cervical region without increased warmth or erythema the scar. There was decreased grip strength with unremarkable Spurling's maneuver. Palpation of the cervical facet cervical paraspinal musculature and thoracic facet thoracic paraspinal musculature region reproduced moderate discomfort. There was limited range of motion of the cervical spine noted. Palpation of the acromial clavicular and glenohumeral joint regions reproduced mild discomfort. Palpation over the thoracic facet thoracic paraspinal musculature region was without crepitus of the thoracic region noted. Palpation over the lumbar paraspinal muscle lumbar facet region associated with moderate to marked severe discomfort. There was increased pain with lateral bending rotation and extension and palpation of the lumbar facets. Straight leg raising was tolerates approximately 20 without increased pain with dorsiflexion noted. There was negative clonus negative Homans. No definite  sensory deficit of dermatomal distribution was detected. EHL strength was decreased. There was tenderness to palpation of the lower extremities. With lesion of the lower extremity on the right noted. There was negative clonus negative Homans. There was tends on the greater trochanteric region iliotibial band region of moderate degree as well as the PSIS and PII S region right side greater than the left side. There was negative clonus negative Homans abdomen was without significant tends to palpation and no costovertebral tenderness was noted.     Assessment & Plan:    Degenerative disc disease cervical spine Status post surgery cervical region  Degenerative disc disease lumbar spine Lumbar facet syndrome Lumbar stenosis  Sacroiliac joint dysfunction  Bilateral occipital neuralgia  MRSA    PLAN   Continue present medication oxycodone and MS Contin Dr. Manson Passey  F/U PCP for evaliation of MRSA, BP, general medical  Condition, and to discuss infectious disease consultation  Infectious disease consultation. Patient will address with Dr. Manson Passey as we discussed on today's visit  F/U surgical evaluation.. Patient will follow-up with Dr. Dutch Quint and this regard as needed  F/U neurological evaluation. May consider pending follow-up evaluations  May consider radiofrequency rhizolysis or intraspinal procedures pending response to present treatment and F/U evaluation   [facility] to call [facility] should patient have concerns prior to scheduled return appointment.

## 2015-01-29 ENCOUNTER — Ambulatory Visit: Payer: Medicaid Other | Attending: Pain Medicine | Admitting: Pain Medicine

## 2015-01-29 ENCOUNTER — Encounter: Payer: Self-pay | Admitting: Pain Medicine

## 2015-01-29 VITALS — BP 151/104 | HR 105 | Temp 98.8°F | Resp 16 | Ht 69.5 in | Wt 210.0 lb

## 2015-01-29 DIAGNOSIS — M4806 Spinal stenosis, lumbar region: Secondary | ICD-10-CM | POA: Insufficient documentation

## 2015-01-29 DIAGNOSIS — M533 Sacrococcygeal disorders, not elsewhere classified: Secondary | ICD-10-CM | POA: Insufficient documentation

## 2015-01-29 DIAGNOSIS — B9562 Methicillin resistant Staphylococcus aureus infection as the cause of diseases classified elsewhere: Secondary | ICD-10-CM | POA: Diagnosis not present

## 2015-01-29 DIAGNOSIS — Z9889 Other specified postprocedural states: Secondary | ICD-10-CM | POA: Insufficient documentation

## 2015-01-29 DIAGNOSIS — M5416 Radiculopathy, lumbar region: Secondary | ICD-10-CM | POA: Diagnosis not present

## 2015-01-29 DIAGNOSIS — M5481 Occipital neuralgia: Secondary | ICD-10-CM | POA: Insufficient documentation

## 2015-01-29 DIAGNOSIS — M5136 Other intervertebral disc degeneration, lumbar region: Secondary | ICD-10-CM | POA: Diagnosis not present

## 2015-01-29 DIAGNOSIS — M545 Low back pain: Secondary | ICD-10-CM | POA: Diagnosis present

## 2015-01-29 DIAGNOSIS — M51369 Other intervertebral disc degeneration, lumbar region without mention of lumbar back pain or lower extremity pain: Secondary | ICD-10-CM

## 2015-01-29 DIAGNOSIS — A4902 Methicillin resistant Staphylococcus aureus infection, unspecified site: Secondary | ICD-10-CM

## 2015-01-29 DIAGNOSIS — M48062 Spinal stenosis, lumbar region with neurogenic claudication: Secondary | ICD-10-CM

## 2015-01-29 DIAGNOSIS — M5030 Other cervical disc degeneration, unspecified cervical region: Secondary | ICD-10-CM

## 2015-01-29 DIAGNOSIS — M47816 Spondylosis without myelopathy or radiculopathy, lumbar region: Secondary | ICD-10-CM

## 2015-01-29 DIAGNOSIS — M961 Postlaminectomy syndrome, not elsewhere classified: Secondary | ICD-10-CM

## 2015-01-29 DIAGNOSIS — M542 Cervicalgia: Secondary | ICD-10-CM | POA: Diagnosis present

## 2015-01-29 DIAGNOSIS — M48061 Spinal stenosis, lumbar region without neurogenic claudication: Secondary | ICD-10-CM

## 2015-01-29 DIAGNOSIS — M47817 Spondylosis without myelopathy or radiculopathy, lumbosacral region: Secondary | ICD-10-CM | POA: Diagnosis not present

## 2015-01-29 DIAGNOSIS — M5134 Other intervertebral disc degeneration, thoracic region: Secondary | ICD-10-CM

## 2015-01-29 DIAGNOSIS — M47812 Spondylosis without myelopathy or radiculopathy, cervical region: Secondary | ICD-10-CM | POA: Diagnosis not present

## 2015-01-29 MED ORDER — MORPHINE SULFATE ER 60 MG PO TBCR: EXTENDED_RELEASE_TABLET | ORAL | Status: DC

## 2015-01-29 MED ORDER — OXYCODONE HCL 20 MG PO TABS: ORAL_TABLET | ORAL | Status: DC

## 2015-01-29 NOTE — Patient Instructions (Addendum)
PLAN   Continue present medication oxycodone and MS Contin  F/U PCP Dr. Manson Passey for evaliation of  BP and general medical  condition. Blood pressure is elevated please follow-up with Dr. Manson Passey as discussed  F/U surgical evaluation. May consider pending follow-up evaluations  F/U neurological evaluation. May consider pending follow-up evaluations  Infectious disease evaluation as discussed  May consider radiofrequency rhizolysis or intraspinal procedures pending response to present treatment and F/U evaluation   Patient to call [facility] should patient have concerns prior to scheduled return appointment.  A prescription for OXYCODONE, MS CONTIN was given to you today.

## 2015-01-29 NOTE — Progress Notes (Signed)
Safety precautions to be maintained throughout the outpatient stay will include: orient to surroundings, keep bed in low position, maintain call bell within reach at all times, provide assistance with transfer out of bed and ambulation.  

## 2015-01-29 NOTE — Progress Notes (Signed)
Subjective:    Patient ID: Brittney Banks, female    DOB: 03-21-1971, 44 y.o.   MRN: [id number]  HPI  Patient is 44 year old female [facility] further evaluation and treatment of pain involving the neck upper extremity regions lower back and lower extremity regions. The patient is status post surgical intervention of the cervical region and was undergo surgical intervention of the lumbar region by Dr. Dutch Quint. The patient has been unable to undergo surgery of the lumbar region due to recurring MRSA infections. We discussed patient's condition on today's visit and decision was made to have patient follow-up with Dr. Manson Passey and to proceed with infectious disease evaluation. The patient will undergo follow-up evaluation with infectious disease physician in [place] as planned pending consent of Dr. Manson Passey. We will continue present medications of oxycodone and MS Contin which patient states controls pain rather well. We will remain available to consider modification of treatment pending follow-up evaluation. The patient is with another outbreak of MRSA at this time involving the lower extremity on the left predominantly. All understanding and in agreement status treatment plan     Review of Systems     Objective:   Physical Exam  There was tends to palpation of the splenius capitis Cipro talus musculature region of mild degree. There was well-healed surgical scar the cervical region without increased warmth or erythema in the region of the scar. There was limited range of motion of the cervical spine with unremarkable Spurling's maneuver. Palpation of the acromioclavicular and glenohumeral joint regions reproduced mild discomfort. Patient was with decreased grip strength noted. Tinel and Phalen's maneuver without increased pain of significant degree. Palpation over the thoracic facet thoracic paraspinal musculature associated with moderate discomfort. No crepitus of the thoracic  region was noted. Palpation the lumbar paraspinal muscles lumbar facet region associated with moderate moderate severe discomfort. There was tenderness of the PSIS PII S region greater trochanteric region and iliotibial band region palpation of the gluteal and piriformis musculature regions reproduced moderate discomfort as well. Leg raising limited to 20 without increased pain with dorsiflexion noted. Region was with evidence of lesions of the left lower extremity. Lesions were noted below the level of the knee. There was decreased EHL strength. No definite sensory deficit of dermatomal description was detected. There was negative clonus negative Homans. Abdomen was with mild tends to palpation and no costovertebral angle tenderness was noted.      Assessment & Plan:     Degenerative disc disease cervical spine Status post surgery cervical region  Degenerative disc disease lumbar spine Lumbar facet syndrome Lumbar stenosis  Sacroiliac joint dysfunction  Bilateral occipital neuralgia  MRSA (Patient with reoccurrence of MRSA infection at this time)    PLAN   Continue present medication oxycodone and MS Contin  F/U PCP Dr. Manson Passey to discuss referral to infectious disease physician in [facility] which we located today and provided name and telephone number of the infectious disease physician. The patient will follow-up with Dr. Manson Passey for evaluation of for the patient for evaliation of  BP and general medical  condition as discussed. Patient's blood pressure was noted to be elevated. Patient was aware of elevated blood pressure and will follow-up with Dr. Manson Passey as discussed   F/U surgical evaluation. Patient will follow-up with Dr. Dutch Quint as discussed  F/U neurological evaluation. May consider pending follow-up evaluations  Infectious disease evaluation as discussed. Patient will discussed the name of the infectious disease physician which we  located in [facility] and we'll proceed with evaluation pending discussion with Dr. Manson Passey  May consider radiofrequency rhizolysis or intraspinal procedures pending response to present treatment and F/U evaluation . We will avoid considering interventional treatment and patient at this time due to recurring MRSA infection  [facility] to call [facility] should patient have concerns prior to scheduled return appointment.

## 2015-03-02 ENCOUNTER — Telehealth: Payer: Self-pay | Admitting: *Deleted

## 2015-03-02 ENCOUNTER — Ambulatory Visit: Payer: Medicare Other | Admitting: Pain Medicine

## 2015-03-02 ENCOUNTER — Other Ambulatory Visit: Payer: Self-pay | Admitting: Pain Medicine

## 2015-03-02 ENCOUNTER — Telehealth: Payer: Self-pay | Admitting: Pain Medicine

## 2015-03-02 MED ORDER — MORPHINE SULFATE ER 60 MG PO TBCR: EXTENDED_RELEASE_TABLET | ORAL | Status: DC

## 2015-03-02 MED ORDER — OXYCODONE HCL 20 MG PO TABS: ORAL_TABLET | ORAL | Status: DC

## 2015-03-02 NOTE — Telephone Encounter (Signed)
Brittney Banks Patient may have someone (mother) pickup medications due to her severe MRSA infection

## 2015-03-02 NOTE — Telephone Encounter (Signed)
meds due 03-04-15. Dr.

## 2015-03-02 NOTE — Telephone Encounter (Signed)
Pt called and informed scripts are up front and ready for pickup by Mother.

## 2015-03-02 NOTE — Telephone Encounter (Signed)
Has severe break out of mrsa / needs meds but cannot come in / could her mom pick up scripts until till she gets this under control please

## 2015-03-02 NOTE — Telephone Encounter (Signed)
Dr. Metta Clines              Please advise if you will write script for patient to pick up. Thank you.

## 2015-03-30 ENCOUNTER — Encounter: Payer: Medicare Other | Admitting: Pain Medicine

## 2015-04-01 ENCOUNTER — Encounter: Payer: Medicare Other | Admitting: Pain Medicine

## 2015-04-01 ENCOUNTER — Ambulatory Visit: Payer: Medicaid Other | Attending: Pain Medicine | Admitting: Pain Medicine

## 2015-04-01 ENCOUNTER — Encounter: Payer: Self-pay | Admitting: Pain Medicine

## 2015-04-01 VITALS — BP 148/86 | HR 100 | Temp 98.3°F | Resp 15 | Ht 69.0 in | Wt 210.0 lb

## 2015-04-01 DIAGNOSIS — M5030 Other cervical disc degeneration, unspecified cervical region: Secondary | ICD-10-CM

## 2015-04-01 DIAGNOSIS — M5136 Other intervertebral disc degeneration, lumbar region: Secondary | ICD-10-CM | POA: Diagnosis not present

## 2015-04-01 DIAGNOSIS — M5416 Radiculopathy, lumbar region: Secondary | ICD-10-CM | POA: Diagnosis not present

## 2015-04-01 DIAGNOSIS — M51369 Other intervertebral disc degeneration, lumbar region without mention of lumbar back pain or lower extremity pain: Secondary | ICD-10-CM

## 2015-04-01 DIAGNOSIS — M542 Cervicalgia: Secondary | ICD-10-CM | POA: Diagnosis present

## 2015-04-01 DIAGNOSIS — M47817 Spondylosis without myelopathy or radiculopathy, lumbosacral region: Secondary | ICD-10-CM | POA: Diagnosis not present

## 2015-04-01 DIAGNOSIS — M47812 Spondylosis without myelopathy or radiculopathy, cervical region: Secondary | ICD-10-CM | POA: Diagnosis not present

## 2015-04-01 DIAGNOSIS — M533 Sacrococcygeal disorders, not elsewhere classified: Secondary | ICD-10-CM | POA: Insufficient documentation

## 2015-04-01 DIAGNOSIS — M48062 Spinal stenosis, lumbar region with neurogenic claudication: Secondary | ICD-10-CM

## 2015-04-01 DIAGNOSIS — M4806 Spinal stenosis, lumbar region: Secondary | ICD-10-CM | POA: Insufficient documentation

## 2015-04-01 DIAGNOSIS — M48061 Spinal stenosis, lumbar region without neurogenic claudication: Secondary | ICD-10-CM

## 2015-04-01 DIAGNOSIS — M47816 Spondylosis without myelopathy or radiculopathy, lumbar region: Secondary | ICD-10-CM

## 2015-04-01 DIAGNOSIS — Z9889 Other specified postprocedural states: Secondary | ICD-10-CM | POA: Insufficient documentation

## 2015-04-01 DIAGNOSIS — M5481 Occipital neuralgia: Secondary | ICD-10-CM | POA: Diagnosis not present

## 2015-04-01 DIAGNOSIS — A4902 Methicillin resistant Staphylococcus aureus infection, unspecified site: Secondary | ICD-10-CM

## 2015-04-01 DIAGNOSIS — M961 Postlaminectomy syndrome, not elsewhere classified: Secondary | ICD-10-CM

## 2015-04-01 DIAGNOSIS — M546 Pain in thoracic spine: Secondary | ICD-10-CM | POA: Diagnosis present

## 2015-04-01 DIAGNOSIS — M5134 Other intervertebral disc degeneration, thoracic region: Secondary | ICD-10-CM

## 2015-04-01 MED ORDER — MORPHINE SULFATE ER 60 MG PO TBCR: EXTENDED_RELEASE_TABLET | ORAL | Status: DC

## 2015-04-01 MED ORDER — OXYCODONE HCL 20 MG PO TABS: ORAL_TABLET | ORAL | Status: DC

## 2015-04-01 NOTE — Patient Instructions (Addendum)
PLAN   Continue present medication oxycodone and MS Contin  F/U PCP Dr. Manson Passey for evaliation of  BP MRSA and general medical  condition.   F/U surgical evaluation. May consider pending follow-up evaluations  F/U neurological evaluation. May consider pending follow-up evaluations  Infectious disease evaluation as discussed. Ask receptionist date of your infectious disease consultation  May consider radiofrequency rhizolysis or intraspinal procedures pending response to present treatment and F/U evaluation   [facility] to call [facility] should patient have concerns prior to scheduled return appointment.

## 2015-04-01 NOTE — Progress Notes (Signed)
Safety precautions to be maintained throughout the outpatient stay will include: orient to surroundings, keep bed in low position, maintain call bell within reach at all times, provide assistance with transfer out of bed and ambulation.  

## 2015-04-01 NOTE — Progress Notes (Signed)
   Subjective:    Patient ID: Brittney Banks, female    DOB: 06-May-1970, 44 y.o.   MRN: [id number]  HPI  The patient is a 44 year old female who returns to pain management Center for further evaluation and treatment of pain involving the region of the neck entire back upper and lower extremity regions. The patient is status post surgical intervention of the cervical region and was planning to undergo surgical intervention of the lumbar region which was counseled due to reoccurring MRSA infections. At the present time patient continues under care of her primary care physician Dr. Manson Passey. We discuss further evaluation including infectious disease consultation. Patient states that Dr. Manson Passey wishes for [place] to refer patient to infectious disease follow-up with Dr. Manson Passey continues to treat patient's MRSA and continues to treat patient's general medical conditions. We will continue medications as prescribed. Patient states that medications are covering her pain adequately. The patient will also follow Dr. Dutch Quint neurosurgeon as discussed and we will remain available to consider this moderate patient treatment pending follow-up evaluation. The patient was understanding and agreed to suggested treatment plan     Review of Systems     Objective:   Physical Exam There was tennis to palpation over the cervical facet cervical paraspinal musculature region with patient being with decreased grip strength. Tinel and Phalen's maneuver were without increased pain of significant degree. There appeared to be unremarkable Spurling's maneuver. Palpation over the thoracic facet thoracic paraspinal musculature region reproduced pain of moderate degree. There was moderate muscle spasms noted in the thoracic region without crepitus of the thoracic region noted. Palpation over the lumbar paraspinal musculature region lumbar facet region was with moderate to severe discomfort. Lateral bending rotation extension and palpation of the  lumbar facets reproduced moderately severe discomfort. Straight leg leg raising was limited to approximately 20. EHL strength appeared to be decreased there was negative clonus negative Homans. There was moderate to moderately severe tends to palpation of the PSIS and PII S region as well as the greater trochanteric region. The knees were with moderate tenderness to palpation with negative anterior and posterior drawer signs without ballottement of the patella. There was question decreased sensation along the L5 dermatomal distribution there was negative clonus negative Homans. Abdomen was nontender and no costovertebral maintenance noted       Assessment & Plan:    Degenerative disc disease cervical spine Status post surgery cervical region  Degenerative disc disease lumbar spine Lumbar facet syndrome Lumbar stenosis  Sacroiliac joint dysfunction  Bilateral occipital neuralgia  MRSA     PLAN   Continue present medication oxycodone and MS Contin  F/U PCP Dr. Manson Passey for evaliation of  BP MRSA and general medical  condition.   F/U surgical evaluation. The patient will follow-up with Dr. Dutch Quint in this regard  F/U neurological evaluation. May consider pending follow-up evaluations  Infectious disease evaluation as discussed. Ask receptionist date of your infectious disease consultation  May consider radiofrequency rhizolysis or intraspinal procedures pending response to present treatment and F/U evaluation   Patient to call Pain Management Center should patient have concerns prior to scheduled return appointment

## 2015-04-30 ENCOUNTER — Telehealth: Payer: Self-pay | Admitting: Pain Medicine

## 2015-04-30 ENCOUNTER — Ambulatory Visit: Payer: Medicare Other | Admitting: Pain Medicine

## 2015-04-30 MED ORDER — MORPHINE SULFATE ER 60 MG PO TBCR: EXTENDED_RELEASE_TABLET | ORAL | Status: DC

## 2015-04-30 MED ORDER — OXYCODONE HCL 20 MG PO TABS: ORAL_TABLET | ORAL | Status: DC

## 2015-04-30 NOTE — Telephone Encounter (Signed)
error 

## 2015-04-30 NOTE — Telephone Encounter (Signed)
Left voicemail that Rx are ready.

## 2015-05-01 ENCOUNTER — Other Ambulatory Visit: Payer: Self-pay | Admitting: Pain Medicine

## 2015-05-07 NOTE — Progress Notes (Signed)
Patient did not come for appointment due to reoccurring MRSA infection. Prescriptions were provided for patient and were picked up by patient's family There was no charge for the patient since patient was not seen and was with reoccurring MRSA infection

## 2015-06-01 ENCOUNTER — Ambulatory Visit: Payer: Medicaid Other | Attending: Pain Medicine | Admitting: Pain Medicine

## 2015-06-01 ENCOUNTER — Encounter: Payer: Self-pay | Admitting: Pain Medicine

## 2015-06-01 VITALS — BP 161/98 | HR 106 | Temp 98.5°F | Resp 16 | Wt 215.0 lb

## 2015-06-01 DIAGNOSIS — M48062 Spinal stenosis, lumbar region with neurogenic claudication: Secondary | ICD-10-CM

## 2015-06-01 DIAGNOSIS — M5416 Radiculopathy, lumbar region: Secondary | ICD-10-CM | POA: Diagnosis not present

## 2015-06-01 DIAGNOSIS — Z9889 Other specified postprocedural states: Secondary | ICD-10-CM | POA: Diagnosis not present

## 2015-06-01 DIAGNOSIS — M542 Cervicalgia: Secondary | ICD-10-CM | POA: Diagnosis present

## 2015-06-01 DIAGNOSIS — M5136 Other intervertebral disc degeneration, lumbar region: Secondary | ICD-10-CM

## 2015-06-01 DIAGNOSIS — M79606 Pain in leg, unspecified: Secondary | ICD-10-CM | POA: Diagnosis present

## 2015-06-01 DIAGNOSIS — M5481 Occipital neuralgia: Secondary | ICD-10-CM | POA: Diagnosis not present

## 2015-06-01 DIAGNOSIS — M4806 Spinal stenosis, lumbar region: Secondary | ICD-10-CM | POA: Diagnosis not present

## 2015-06-01 DIAGNOSIS — M5030 Other cervical disc degeneration, unspecified cervical region: Secondary | ICD-10-CM | POA: Diagnosis not present

## 2015-06-01 DIAGNOSIS — M48061 Spinal stenosis, lumbar region without neurogenic claudication: Secondary | ICD-10-CM

## 2015-06-01 DIAGNOSIS — M47812 Spondylosis without myelopathy or radiculopathy, cervical region: Secondary | ICD-10-CM | POA: Diagnosis not present

## 2015-06-01 DIAGNOSIS — Z8614 Personal history of Methicillin resistant Staphylococcus aureus infection: Secondary | ICD-10-CM | POA: Diagnosis not present

## 2015-06-01 DIAGNOSIS — M961 Postlaminectomy syndrome, not elsewhere classified: Secondary | ICD-10-CM

## 2015-06-01 DIAGNOSIS — A4902 Methicillin resistant Staphylococcus aureus infection, unspecified site: Secondary | ICD-10-CM

## 2015-06-01 DIAGNOSIS — M47816 Spondylosis without myelopathy or radiculopathy, lumbar region: Secondary | ICD-10-CM

## 2015-06-01 DIAGNOSIS — M533 Sacrococcygeal disorders, not elsewhere classified: Secondary | ICD-10-CM | POA: Insufficient documentation

## 2015-06-01 DIAGNOSIS — M47817 Spondylosis without myelopathy or radiculopathy, lumbosacral region: Secondary | ICD-10-CM | POA: Diagnosis not present

## 2015-06-01 DIAGNOSIS — M546 Pain in thoracic spine: Secondary | ICD-10-CM | POA: Diagnosis present

## 2015-06-01 DIAGNOSIS — M5134 Other intervertebral disc degeneration, thoracic region: Secondary | ICD-10-CM

## 2015-06-01 MED ORDER — MORPHINE SULFATE ER 60 MG PO TBCR: EXTENDED_RELEASE_TABLET | ORAL | Status: DC

## 2015-06-01 MED ORDER — OXYCODONE HCL 20 MG PO TABS: ORAL_TABLET | ORAL | Status: DC

## 2015-06-01 NOTE — Progress Notes (Signed)
Safety precautions to be maintained throughout the outpatient stay will include: orient to surroundings, keep bed in low position, maintain call bell within reach at all times, provide assistance with transfer out of bed and ambulation.  

## 2015-06-01 NOTE — Progress Notes (Signed)
   Subjective:    Patient ID: Brittney Banks, female    DOB: 10/09/1970, 45 y.o.   MRN: [id number]  HPI  Patient is a 45 year old female who returns to pain management for further evaluation and treatment of pain involving the neck entire back upper and lower extremity regions. The patient is status post surgical intervention of the cervical region and is with out plans for surgery of the lumbar region due to recurring MRSA infections. We discussed patient's condition and have advised patient to proceed with infectious disease consultation as has been recommended and has been scheduled for patient previously. The patient will continue care up Dr. Manson Passey at this time. We will continue presently prescribed medications. Patient states that medications are sufficient for controlling pain. We will continue presently prescribed medications and will have patient return to Dr. Manson Passey to discuss referral to infectious disease physician as has previously been discussed on several occasions. The patient is with understanding and in agreement with suggested treatment plan the patient continues home health care assistance with treatment of her lesions. The patient will call pain management should they be change in condition prior to scheduled return appointment  Review of Systems     Objective:   Physical Exam  There was tenderness to palpation of paraspinal misreading cervical region cervical facet region a moderate degree severe degree with well-healed surgical scar of the cervical region without increased warmth and erythema in the region of the scar. The patient was with decreased grip strength and Tinel and Phalen's maneuver were without increase of pain of significant degree. The patient was with unremarkable Spurling's maneuver. There was tenderness over the thoracic facet thoracic paraspinal musculature region a moderate degree with moderate muscle spasms noted in the thoracic region palpation over the lumbar  paraspinal must reason lumbar facet region was with moderately severe discomfort with lateral bending rotation extension and palpation of the lumbar facets reproducing moderately severe discomfort. There was tenderness over the PSIS and PII S region a moderate degree with mild tenderness to moderate tenderness of the gluteal and piriformis musculature region a moderate tenderness of the greater trochanteric region and iliotibial band region. Straight leg raising was limited to 20 without increased pain with dorsiflexion noted. EHL strength appeared to be decreased without a definite sensory deficit of dermatomal distribution detected. There was negative clonus negative Homans Abdomen nontender with no costovertebral angle tenderness noted            Assessment & Plan:      Degenerative disc disease cervical spine Status post surgery cervical region  Degenerative disc disease lumbar spine Lumbar facet syndrome Lumbar stenosis  Sacroiliac joint dysfunction  Bilateral occipital neuralgia  MRSA     PLAN   Continue present medication oxycodone and MS Contin  F/U PCP Dr. Manson Passey for evaliation of  BP MRSA and general medical  condition.   F/U surgical evaluation. May consider pending follow-up evaluations  F/U neurological evaluation. May consider pending follow-up evaluations  Infectious disease evaluation as discussed.   May consider radiofrequency rhizolysis or intraspinal procedures pending response to present treatment and F/U evaluation . We will avoid such procedures at this time  Patient to call Pain Management Center should patient have concerns prior to scheduled return appointment.

## 2015-06-01 NOTE — Patient Instructions (Signed)
PLAN   Continue present medication oxycodone and MS Contin  F/U PCP Dr. Manson Passey for evaliation of  BP MRSA and general medical  condition.   F/U surgical evaluation. May consider pending follow-up evaluations  F/U neurological evaluation. May consider pending follow-up evaluations  Infectious disease evaluation as discussed.   May consider radiofrequency rhizolysis or intraspinal procedures pending response to present treatment and F/U evaluation . We will avoid such procedures at this time  Patient to call Pain Management Center should patient have concerns prior to scheduled return appointment.

## 2015-06-29 ENCOUNTER — Telehealth: Payer: Self-pay | Admitting: Pain Medicine

## 2015-06-29 ENCOUNTER — Other Ambulatory Visit: Payer: Self-pay | Admitting: Pain Medicine

## 2015-06-29 ENCOUNTER — Ambulatory Visit: Payer: Medicare Other | Admitting: Pain Medicine

## 2015-06-29 MED ORDER — MORPHINE SULFATE ER 60 MG PO TBCR: EXTENDED_RELEASE_TABLET | ORAL | Status: DC

## 2015-06-29 MED ORDER — OXYCODONE HCL 20 MG PO TABS: ORAL_TABLET | ORAL | Status: DC

## 2015-06-30 NOTE — Telephone Encounter (Signed)
error 

## 2015-07-29 ENCOUNTER — Encounter: Payer: Self-pay | Admitting: Pain Medicine

## 2015-07-29 ENCOUNTER — Ambulatory Visit: Payer: Medicaid Other | Attending: Pain Medicine | Admitting: Pain Medicine

## 2015-07-29 VITALS — BP 158/80 | HR 108 | Temp 98.6°F | Resp 18 | Ht 69.5 in | Wt 210.0 lb

## 2015-07-29 DIAGNOSIS — M4806 Spinal stenosis, lumbar region: Secondary | ICD-10-CM | POA: Insufficient documentation

## 2015-07-29 DIAGNOSIS — M5416 Radiculopathy, lumbar region: Secondary | ICD-10-CM | POA: Diagnosis not present

## 2015-07-29 DIAGNOSIS — M48062 Spinal stenosis, lumbar region with neurogenic claudication: Secondary | ICD-10-CM

## 2015-07-29 DIAGNOSIS — M542 Cervicalgia: Secondary | ICD-10-CM | POA: Diagnosis present

## 2015-07-29 DIAGNOSIS — M533 Sacrococcygeal disorders, not elsewhere classified: Secondary | ICD-10-CM | POA: Insufficient documentation

## 2015-07-29 DIAGNOSIS — Z8614 Personal history of Methicillin resistant Staphylococcus aureus infection: Secondary | ICD-10-CM | POA: Insufficient documentation

## 2015-07-29 DIAGNOSIS — M51369 Other intervertebral disc degeneration, lumbar region without mention of lumbar back pain or lower extremity pain: Secondary | ICD-10-CM

## 2015-07-29 DIAGNOSIS — Z9889 Other specified postprocedural states: Secondary | ICD-10-CM | POA: Diagnosis not present

## 2015-07-29 DIAGNOSIS — M545 Low back pain: Secondary | ICD-10-CM | POA: Diagnosis not present

## 2015-07-29 DIAGNOSIS — M961 Postlaminectomy syndrome, not elsewhere classified: Secondary | ICD-10-CM | POA: Diagnosis not present

## 2015-07-29 DIAGNOSIS — M5481 Occipital neuralgia: Secondary | ICD-10-CM | POA: Diagnosis not present

## 2015-07-29 DIAGNOSIS — M47812 Spondylosis without myelopathy or radiculopathy, cervical region: Secondary | ICD-10-CM | POA: Diagnosis not present

## 2015-07-29 DIAGNOSIS — A4902 Methicillin resistant Staphylococcus aureus infection, unspecified site: Secondary | ICD-10-CM | POA: Diagnosis not present

## 2015-07-29 DIAGNOSIS — M79606 Pain in leg, unspecified: Secondary | ICD-10-CM | POA: Diagnosis present

## 2015-07-29 DIAGNOSIS — M5030 Other cervical disc degeneration, unspecified cervical region: Secondary | ICD-10-CM | POA: Diagnosis not present

## 2015-07-29 DIAGNOSIS — M5136 Other intervertebral disc degeneration, lumbar region: Secondary | ICD-10-CM | POA: Diagnosis not present

## 2015-07-29 DIAGNOSIS — M5134 Other intervertebral disc degeneration, thoracic region: Secondary | ICD-10-CM | POA: Diagnosis not present

## 2015-07-29 DIAGNOSIS — M47816 Spondylosis without myelopathy or radiculopathy, lumbar region: Secondary | ICD-10-CM

## 2015-07-29 DIAGNOSIS — M47817 Spondylosis without myelopathy or radiculopathy, lumbosacral region: Secondary | ICD-10-CM | POA: Diagnosis not present

## 2015-07-29 DIAGNOSIS — M546 Pain in thoracic spine: Secondary | ICD-10-CM | POA: Diagnosis present

## 2015-07-29 MED ORDER — OXYCODONE HCL 20 MG PO TABS: ORAL_TABLET | ORAL | Status: DC

## 2015-07-29 MED ORDER — MORPHINE SULFATE ER 60 MG PO TBCR: EXTENDED_RELEASE_TABLET | ORAL | Status: DC

## 2015-07-29 NOTE — Patient Instructions (Addendum)
[facility] Discharge Instructions  General Discharge Instructions :  If you need to reach your doctor call: Monday-Friday 8:00 am - 4:00 pm at [phone] or toll free [phone].  After clinic hours [phone] to have operator reach doctor.  Bring all of your medication bottles to all your appointments in the pain clinic.  To cancel or reschedule your appointment with Pain Management please remember to call 24 hours in advance to avoid a fee.  Refer to the educational materials which you have been given on: General Risks, I had my Procedure. Discharge Instructions, Post Sedation.  Post Procedure Instructions:  The drugs you were given will stay in your system until tomorrow, so for the next 24 hours you should not drive, make any legal decisions or drink any alcoholic beverages.  You may eat anything you prefer, but it is better to start with liquids then soups and crackers, and gradually work up to solid foods.  Please notify your doctor immediately if you have any unusual bleeding, trouble breathing or pain that is not related to your normal pain.  Depending on the type of procedure that was done, some parts of your body may feel week and/or numb.  This usually clears up by tonight or the next day.  Walk with the use of an assistive device or accompanied by an adult for the 24 hours.  You may use ice on the affected area for the first 24 hours.  Put ice in a Ziploc bag and cover with a towel and place against area 15 minutes on 15 minutes off.  You may switch to heat after 24 hours. 

## 2015-07-29 NOTE — Progress Notes (Signed)
Subjective:    Patient ID: Brittney Banks, female    DOB: 1970-11-28, 45 y.o.   MRN: [id number]  HPI  The patient is a 45 year old female who returns to pain management for further evaluation and treatment of pain involving the neck entire back upper and lower extremity region. The patient is with reoccurring MRSA infections and is with pain at the present time. Well controlled with MS Contin and oxycodone. The patient is status post surgical intervention cervical region performed by Dr. Dutch Quint with surgery of the lumbar region counseled due to reoccurring MRSA infections. The patient states that she is with pain fairly well-controlled at the present time and is without reoccurrence of MRSA infection at the present time. The patient states that pain involving the neck and upper extremity regions is associated with weakness of the right upper extremity compared to the left upper extremity. The patient is without significant progression of weakness of the upper extremity are pain involving the cervical region. The patient is with pain of the lumbar lower extremity region and is with weakness of the lower extremities as well without significant progression of weakness. We discussed patient's condition on today's visit and patient will follow-up with Dr. Victoriano Lain discussed exercise program and modifications of activities to avoid progression of patient's symptoms, minimize severity of patient's symptoms, and we will remain available to consider modifications of treatment regimen pending follow-up evaluations the patient pending recommendations of Dr. as well. The patient was in agreement with suggested treatment plan continue MS Contin and oxycodone as prescribed at this time.  Review of Systems     Objective:   Physical Exam  There was tenderness to palpation of the splenius capitis and occipitalis musculature regions palpation which be produced moderate discomfort with well-healed surgical scar of the  cervical region without increased warmth or erythema in the region of the scar. There was decreased grip strength on the right compared to the left. Tinel and Phalen's maneuver were without increase of pain of significant degree. Palpation over the thoracic region thoracic facet region was attends to palpation with moderate muscle spasms noted in thoracic paraspinal musculature region and upper mid lower thoracic paraspinal musculature region. No crepitus of the thoracic region was noted. There was tenderness of the acromioclavicular and glenohumeral joint regions of mild to moderate degree. The patient was with tenderness to palpation over the lumbar paraspinal must reason lumbar facet region a moderate degree with lateral bending rotation extension and palpation the lumbar facets reproducing moderate discomfort. Straight leg raising was tolerates approximately 20 without a definite increase of pain with dorsiflexion noted. There was tends to palpation of the knees with negative anterior and posterior drawer signs without ballottement of the patella. There was tenderness along the PSIS and PII S region as well as the greater trochanteric region and iliotibial band region a moderate degree. There was negative clonus negative Homans. Abdomen nontender and no costovertebral angle tenderness noted.          Assessment & Plan:     Degenerative disc disease cervical spine Status post surgery cervical region  Degenerative disc disease lumbar spine Lumbar facet syndrome Lumbar stenosis  Sacroiliac joint dysfunction  Bilateral occipital neuralgia  MRSA      PLAN   Continue present medications MS Contin and oxycodone  F/U PCP for evaliation of  BP and general medical condition and to address infectious disease consultation as discussed.  F/U surgical evaluation. The patient will follow-up with Dr. Dutch Quint as discussed.  We will also discuss patient undergoing evaluation with Dr. Dutch Quint for  recommendations regarding body mechanics and exercise program to help improve patient's condition and retard progression of patient's condition   F/U neurological evaluation. We will avoid further neurological evaluation at this time  May consider radiofrequency rhizolysis or intraspinal procedures pending response to present treatment and F/U evaluation.. We will avoid considering interventional treatment for this patient  Patient to call Pain Management Center should patient have concerns prior to scheduled return appointment

## 2015-08-04 LAB — TOXASSURE SELECT 13 (MW), URINE: PDF: 0

## 2015-08-04 NOTE — Progress Notes (Signed)
Quick Note:  Reviewed. ______ 

## 2015-08-04 NOTE — Progress Notes (Signed)
Quick Note:  Please File. ______

## 2015-08-27 ENCOUNTER — Encounter: Payer: Self-pay | Admitting: Pain Medicine

## 2015-08-27 ENCOUNTER — Ambulatory Visit: Payer: Medicare Other | Attending: Pain Medicine | Admitting: Pain Medicine

## 2015-08-27 VITALS — BP 142/88 | HR 116 | Temp 98.7°F | Resp 18 | Ht 69.0 in | Wt 215.0 lb

## 2015-08-27 DIAGNOSIS — M47817 Spondylosis without myelopathy or radiculopathy, lumbosacral region: Secondary | ICD-10-CM | POA: Diagnosis not present

## 2015-08-27 DIAGNOSIS — M5030 Other cervical disc degeneration, unspecified cervical region: Secondary | ICD-10-CM

## 2015-08-27 DIAGNOSIS — M48062 Spinal stenosis, lumbar region with neurogenic claudication: Secondary | ICD-10-CM

## 2015-08-27 DIAGNOSIS — M48061 Spinal stenosis, lumbar region without neurogenic claudication: Secondary | ICD-10-CM

## 2015-08-27 DIAGNOSIS — M5136 Other intervertebral disc degeneration, lumbar region: Secondary | ICD-10-CM

## 2015-08-27 DIAGNOSIS — M961 Postlaminectomy syndrome, not elsewhere classified: Secondary | ICD-10-CM

## 2015-08-27 DIAGNOSIS — M533 Sacrococcygeal disorders, not elsewhere classified: Secondary | ICD-10-CM | POA: Diagnosis not present

## 2015-08-27 DIAGNOSIS — M5134 Other intervertebral disc degeneration, thoracic region: Secondary | ICD-10-CM

## 2015-08-27 DIAGNOSIS — A4902 Methicillin resistant Staphylococcus aureus infection, unspecified site: Secondary | ICD-10-CM

## 2015-08-27 DIAGNOSIS — M5416 Radiculopathy, lumbar region: Secondary | ICD-10-CM | POA: Diagnosis not present

## 2015-08-27 DIAGNOSIS — M47812 Spondylosis without myelopathy or radiculopathy, cervical region: Secondary | ICD-10-CM | POA: Diagnosis not present

## 2015-08-27 DIAGNOSIS — M47816 Spondylosis without myelopathy or radiculopathy, lumbar region: Secondary | ICD-10-CM

## 2015-08-27 MED ORDER — MORPHINE SULFATE ER 60 MG PO TBCR: EXTENDED_RELEASE_TABLET | ORAL | Status: DC

## 2015-08-27 MED ORDER — OXYCODONE HCL 20 MG PO TABS: ORAL_TABLET | ORAL | Status: DC

## 2015-08-27 NOTE — Progress Notes (Signed)
Safety precautions to be maintained throughout the outpatient stay will include: orient to surroundings, keep bed in low position, maintain call bell within reach at all times, provide assistance with transfer out of bed and ambulation.  

## 2015-08-27 NOTE — Patient Instructions (Addendum)
PLAN   Continue present medication oxycodone and MS Contin  F/U PCP Dr. Manson Passey for evaliation of  BP MRSA and general medical  condition.   F/U surgical evaluation. May consider pending follow-up evaluations. Patient is without plans for surgical intervention due to reoccurring MRSA infection  F/U neurological evaluation. May consider pending follow-up evaluations  Infectious disease evaluation as discussed. Please ask the nurses and secretary the date of your infectious disease evaluation and inform secretary that you would like to see Dr. Judyann Munson  May consider radiofrequency rhizolysis or intraspinal procedures pending response to present treatment and F/U evaluation . We will avoid such procedures at this time  Patient to call Pain Management Center should patient have concerns prior to scheduled return appointment.

## 2015-08-27 NOTE — Progress Notes (Signed)
   Subjective:    Patient ID: Brittney Banks, female    DOB: 01-Feb-1971, 45 y.o.   MRN: [id number]  HPI  The patient is a 45 year old female who returns to pain management for further evaluation and treatment of pain involving the neck entire back upper and lower extremity region with occasional headaches as well. The patient is status post surgical intervention of the cervical region without plans for surgical intervention of the lumbar region cancel due to reoccurring MRSA infections. On today's visit we discussed patient's condition with patient and patient's mother present and decision was made to have patient undergo infectious disease consultation with Dr. Judyann Munson. The patient continues general medical care of Dr. Manson Passey. We discussed patient's condition and will continue medications consisting of MS Contin and oxycodone for breakthrough pain. The patient is recovering from recent MRSA exacerbation. We will continue medications as prescribed and proceed with infectious disease consultation as discussed. All were in agreement suggested treatment plan The patient denied any trauma change in events of daily living call significant change in symptomatology.   Review of Systems     Objective:   Physical Exam  There was tends to palpation of the paraspinal misreading cervical and cervical facet region of moderate degree with moderate tenderness of the splenius capitis and occipitalis musculature regions. There was well-healed surgical scar of the cervical region without increased warmth and erythema in the region scar. The patient was with decreased grip strength on the left compared to the right. Palpation of the acromioclavicular and glenohumeral joint regions reproduces moderate discomfort. The patient appeared to be with unremarkable Spurling's maneuver. Palpation over the thoracic region thoracic region was with moderate to moderately severe tenderness to palpation of muscle spasm of the  thoracic region. No crepitus of the thoracic region noted. Palpation of the lumbar paraspinal must reason lumbar facet region associated with moderate to moderate severe discomfort with lateral bending rotation extension and palpation of lumbar facets reproducing moderate severe discomfort. Straight leg raise was tolerates walking 20 with questionably increased pain with dorsiflexion noted. EHL strength appeared to be decreased. There was question decreased sensation along the L5 dermatomal distribution with negative clonus negative Homans. DTRs appeared to be trace at the knees.  The abdomen was nontender with no costovertebral length is noted    Assessment & Plan:     Degenerative disc disease cervical spine Status post surgery cervical region  Degenerative disc disease lumbar spine Lumbar facet syndrome Lumbar stenosis  Sacroiliac joint dysfunction  Bilateral occipital neuralgia      PLAN   Continue present medication oxycodone and MS Contin  F/U PCP Dr. Manson Passey for evaliation of  BP MRSA and general medical  condition.   F/U surgical evaluation. May consider pending follow-up evaluations. Patient is without plans for surgical intervention due to reoccurring MRSA infection  F/U neurological evaluation. May consider pending follow-up evaluations  Infectious disease evaluation as discussed. Please ask the nurses and secretary the date of your infectious disease evaluation and inform secretary that you would like to see Dr. Judyann Munson  May consider radiofrequency rhizolysis or intraspinal procedures pending response to present treatment and F/U evaluation . We will avoid such procedures at this time  Patient to call Pain Management Center should patient have concerns prior to scheduled return appointment.

## 2015-09-24 ENCOUNTER — Encounter: Payer: Self-pay | Admitting: Pain Medicine

## 2015-09-24 ENCOUNTER — Ambulatory Visit: Payer: Medicaid Other | Attending: Pain Medicine | Admitting: Pain Medicine

## 2015-09-24 VITALS — BP 144/79 | HR 112 | Temp 98.6°F | Resp 16 | Ht 69.0 in | Wt 215.0 lb

## 2015-09-24 DIAGNOSIS — M4806 Spinal stenosis, lumbar region: Secondary | ICD-10-CM | POA: Insufficient documentation

## 2015-09-24 DIAGNOSIS — Z9889 Other specified postprocedural states: Secondary | ICD-10-CM | POA: Diagnosis not present

## 2015-09-24 DIAGNOSIS — M533 Sacrococcygeal disorders, not elsewhere classified: Secondary | ICD-10-CM | POA: Insufficient documentation

## 2015-09-24 DIAGNOSIS — M546 Pain in thoracic spine: Secondary | ICD-10-CM | POA: Diagnosis present

## 2015-09-24 DIAGNOSIS — M62830 Muscle spasm of back: Secondary | ICD-10-CM | POA: Insufficient documentation

## 2015-09-24 DIAGNOSIS — M5030 Other cervical disc degeneration, unspecified cervical region: Secondary | ICD-10-CM

## 2015-09-24 DIAGNOSIS — M51369 Other intervertebral disc degeneration, lumbar region without mention of lumbar back pain or lower extremity pain: Secondary | ICD-10-CM

## 2015-09-24 DIAGNOSIS — M47816 Spondylosis without myelopathy or radiculopathy, lumbar region: Secondary | ICD-10-CM

## 2015-09-24 DIAGNOSIS — M47817 Spondylosis without myelopathy or radiculopathy, lumbosacral region: Secondary | ICD-10-CM | POA: Diagnosis not present

## 2015-09-24 DIAGNOSIS — M5136 Other intervertebral disc degeneration, lumbar region: Secondary | ICD-10-CM | POA: Insufficient documentation

## 2015-09-24 DIAGNOSIS — M48061 Spinal stenosis, lumbar region without neurogenic claudication: Secondary | ICD-10-CM

## 2015-09-24 DIAGNOSIS — M5481 Occipital neuralgia: Secondary | ICD-10-CM | POA: Diagnosis not present

## 2015-09-24 DIAGNOSIS — M961 Postlaminectomy syndrome, not elsewhere classified: Secondary | ICD-10-CM

## 2015-09-24 DIAGNOSIS — A4902 Methicillin resistant Staphylococcus aureus infection, unspecified site: Secondary | ICD-10-CM

## 2015-09-24 DIAGNOSIS — M48062 Spinal stenosis, lumbar region with neurogenic claudication: Secondary | ICD-10-CM

## 2015-09-24 DIAGNOSIS — M5416 Radiculopathy, lumbar region: Secondary | ICD-10-CM | POA: Diagnosis not present

## 2015-09-24 DIAGNOSIS — M5134 Other intervertebral disc degeneration, thoracic region: Secondary | ICD-10-CM

## 2015-09-24 DIAGNOSIS — Z8614 Personal history of Methicillin resistant Staphylococcus aureus infection: Secondary | ICD-10-CM | POA: Diagnosis not present

## 2015-09-24 DIAGNOSIS — M542 Cervicalgia: Secondary | ICD-10-CM | POA: Diagnosis present

## 2015-09-24 DIAGNOSIS — M47812 Spondylosis without myelopathy or radiculopathy, cervical region: Secondary | ICD-10-CM | POA: Diagnosis not present

## 2015-09-24 MED ORDER — OXYCODONE HCL 20 MG PO TABS: ORAL_TABLET | ORAL | Status: DC

## 2015-09-24 MED ORDER — MORPHINE SULFATE ER 60 MG PO TBCR: EXTENDED_RELEASE_TABLET | ORAL | Status: DC

## 2015-09-24 NOTE — Progress Notes (Signed)
   Subjective:    Patient ID: Brittney Banks, female    DOB: 1971/02/29, 45 y.o.   MRN: [id number]  HPI  The patient is a 45 year old female who returns to pain management for further evaluation and treatment of pain involving the neck upper extremity regions mid back lower back and lower extremity regions. The patient is status post surgical intervention of the cervical region performed by Dr. Dutch Quint with need for surgery of the lumbar region which has been canceled due to recurring MRSA infections. On today's visit we discussed patient undergoing evaluation by infectious disease physician Dr. Judyann Munson. The patient has had recent infection involving the inguinal region of the left lower extremity and is presently with pain fairly well-controlled with the present treatment regimen. We will proceed with scheduling infectious disease consultation and we'll continue present medications as prescribed at this time. The patient denies any other significant changes in her condition. All agreed to suggested treatment plan. We will continue MS Contin with oxycodone for breakthrough pain.  Review of Systems     Objective:   Physical Exam  There was tenderness to palpation of the paraspinal muscular treat the cervical region cervical facet region with limited range of motion of the cervical spine with well-healed surgical scar of the cervical region without increased warmth and erythema in the region of the scar. The patient was with decreased grip strength on the left compared to the right. The patient appeared to be with unremarkable Spurling's maneuver. There was mild to moderate tenderness of the acromioclavicular and glenohumeral joint regions. Palpation over the thoracic region was attends to palpation and evidence of muscle spasms with no crepitus of the thoracic region noted. Tinel and Phalen's maneuver were without increased pain of significant degree. Tenderness to palpation was noted in the lumbar  paraspinal musculature region of moderate degree with lateral bending rotation extension and palpation of the lumbar facets reproducing moderate discomfort. Straight leg raising was tolerates approximately 20 without a definite increase of pain with dorsiflexion noted. There was tenderness to palpation of the knees and DTRs were difficult to elicit no definite sensory deficit or dermatomal distribution was detected. There was significant scarring noted of the lower extremities due to previous MRSA infections. EHL strength was decreased. There was tends to palpation of the knees with negative anterior and posterior drawer signs. Palpation of the PSIS and PII S regions reproduce moderate severe discomfort as well. There appeared to be negative clonus negative Homans and abdomen was nontender with no costovertebral angle tenderness noted      Assessment & Plan:        Degenerative disc disease cervical spine Status post surgery cervical region  Degenerative disc disease lumbar spine Lumbar facet syndrome Lumbar stenosis  Sacroiliac joint dysfunction  Bilateral occipital neuralgia       PLAN   Continue present medication oxycodone and MS Contin  F/U PCP Dr. Manson Passey for evaliation of  BP, MRSA,and general medical  condition.   F/U surgical evaluation. May consider pending follow-up evaluations. Patient is without plans for surgical intervention due to reoccurring MRSA infection  F/U neurological evaluation. May consider pending follow-up evaluations  Infectious disease evaluation with Dr. Judyann Munson as discussed  May consider radiofrequency rhizolysis or intraspinal procedures pending response to present treatment and F/U evaluation . We will avoid such procedures at this time  Patient to call Pain Management Center should patient have concerns prior to scheduled return appointment.

## 2015-09-24 NOTE — Patient Instructions (Addendum)
PLAN   Continue present medication oxycodone and MS Contin  F/U PCP Dr. Manson Passey for evaliation of  BP, MRSA,and general medical  condition.   F/U surgical evaluation. May consider pending follow-up evaluations. Patient is without plans for surgical intervention due to reoccurring MRSA infection  F/U neurological evaluation. May consider pending follow-up evaluations  Infectious disease evaluation with Dr. Judyann Munson as discussed  May consider radiofrequency rhizolysis or intraspinal procedures pending response to present treatment and F/U evaluation . We will avoid such procedures at this time  Patient to call Pain Management Center should patient have concerns prior to scheduled return appointment.

## 2015-10-21 ENCOUNTER — Ambulatory Visit: Payer: Medicare Other | Admitting: Internal Medicine

## 2015-10-26 ENCOUNTER — Other Ambulatory Visit: Payer: Self-pay | Admitting: Pain Medicine

## 2015-10-26 ENCOUNTER — Telehealth: Payer: Self-pay | Admitting: *Deleted

## 2015-10-26 MED ORDER — MORPHINE SULFATE ER 60 MG PO TBCR: EXTENDED_RELEASE_TABLET | ORAL | Status: DC

## 2015-10-26 MED ORDER — OXYCODONE HCL 20 MG PO TABS: ORAL_TABLET | ORAL | Status: DC

## 2015-10-26 NOTE — Telephone Encounter (Signed)
pt has mrsa out break and is running a higher fever and have cancelled her appt for tomorrow. Pt is requesting for her mom to come and pick up her prescription and wants to know if this will be okay? Please give the pt a call..TD

## 2015-10-26 NOTE — Telephone Encounter (Signed)
Nurses and Secretaries Please inform patient that her mother may pick up her prescriptions tomorrow as requested

## 2015-10-27 ENCOUNTER — Ambulatory Visit: Payer: Medicare Other | Admitting: Pain Medicine

## 2015-11-11 ENCOUNTER — Encounter: Payer: Self-pay | Admitting: Internal Medicine

## 2015-11-11 ENCOUNTER — Ambulatory Visit (INDEPENDENT_AMBULATORY_CARE_PROVIDER_SITE_OTHER): Payer: Medicare Other | Admitting: Internal Medicine

## 2015-11-11 VITALS — BP 138/94 | HR 106 | Temp 98.7°F | Ht 69.0 in | Wt 292.0 lb

## 2015-11-11 DIAGNOSIS — L03116 Cellulitis of left lower limb: Secondary | ICD-10-CM | POA: Diagnosis not present

## 2015-11-11 DIAGNOSIS — A4902 Methicillin resistant Staphylococcus aureus infection, unspecified site: Secondary | ICD-10-CM | POA: Diagnosis not present

## 2015-11-11 LAB — CBC WITH DIFFERENTIAL/PLATELET
BASOS PCT: 0 %
Basophils Absolute: 0 cells/uL (ref 0–200)
EOS ABS: 202 {cells}/uL (ref 15–500)
Eosinophils Relative: 2 %
HEMATOCRIT: 52.5 % — AB (ref 35.0–45.0)
HEMOGLOBIN: 18.1 g/dL — AB (ref 11.7–15.5)
LYMPHS ABS: 2828 {cells}/uL (ref 850–3900)
LYMPHS PCT: 28 %
MCH: 31.3 pg (ref 27.0–33.0)
MCHC: 34.5 g/dL (ref 32.0–36.0)
MCV: 90.8 fL (ref 80.0–100.0)
MONO ABS: 505 {cells}/uL (ref 200–950)
MPV: 10.6 fL (ref 7.5–12.5)
Monocytes Relative: 5 %
NEUTROS PCT: 65 %
Neutro Abs: 6565 cells/uL (ref 1500–7800)
Platelets: 209 10*3/uL (ref 140–400)
RBC: 5.78 MIL/uL — ABNORMAL HIGH (ref 3.80–5.10)
RDW: 13.1 % (ref 11.0–15.0)
WBC: 10.1 10*3/uL (ref 3.8–10.8)

## 2015-11-11 NOTE — Progress Notes (Signed)
RFV: recurrent MRSA skin infection Subjective:    Patient ID: Brittney Banks, female    DOB: 14-Oct-1970, 45 y.o.   MRN: [id number]  HPI Since 2008, when she had cervical fusion c/b wound dehiscence, post op infection, she has had recurrent mrsa and mssa skin infection, often to legs. Previously seen by dr. Leavy Cella. She now feels in the last 18 months being oral abtx more than not due to back t oback infections. Currently on linezolid Allergies  Allergen Reactions  . Anaprox [Naproxen Sodium] Shortness Of Breath    Redness of skin and difficulty breathing  . Aspirin Nausea And Vomiting    Severe cramping, vomiting  . Ceftin [Cefuroxime Axetil] Shortness Of Breath    Skin turns red  . Celexa [Citalopram Hydrobromide] Shortness Of Breath    Breathing difficulty  . Ciprofloxacin Shortness Of Breath    Skin turns red  . Contrast Media [Iodinated Diagnostic Agents] Shortness Of Breath    Breathing difficulty  . Cortisol [Hydrocortisone] Shortness Of Breath    Breathing difficulty and redness of skin  . Elavil [Amitriptyline] Shortness Of Breath    Redness of skin and blisters of skin Difficulty breathing  . Hctz [Hydrochlorothiazide] Shortness Of Breath  . Imitrex [Sumatriptan] Shortness Of Breath    Redness and difficulty breathing  . Lexapro [Escitalopram Oxalate] Shortness Of Breath    Breathing difficulty Redness to skin  . Methadone Nausea And Vomiting    Severely vomiting  . Motrin [Ibuprofen] Nausea And Vomiting  . Naprosyn [Naproxen] Shortness Of Breath    Breathing difficulty Redness of skin  . Neurontin [Gabapentin] Shortness Of Breath    Breathing difficulty  Redness to skin  . Penicillins Shortness Of Breath    Redness to skin  . Sulfa Antibiotics Shortness Of Breath    Severe breathing difficulty And redness of skin  . Tagamet [Cimetidine] Nausea And Vomiting    Severe vomiting  . Toradol [Ketorolac Tromethamine] Shortness Of Breath    Redness to skin  .  Vancomycin Shortness Of Breath  . Zoloft [Sertraline Hcl] Shortness Of Breath    Breathing difficulty Redness to skin  . Zyvox [Linezolid] Rash    Blistery rash  . Iodine Swelling    Redness to skin   Current Outpatient Prescriptions on File Prior to Visit  Medication Sig Dispense Refill  . amLODipine (NORVASC) 10 MG tablet Take 20 mg by mouth daily.    Marland Kitchen linezolid (ZYVOX) 100 MG/5ML suspension Inject 1,500 mg into the muscle 2 (two) times daily.    . Loratadine (CLARITIN PO) Take by mouth.    . metFORMIN (GLUCOPHAGE) 850 MG tablet Take 850 mg by mouth 3 (three) times daily.    Marland Kitchen morphine (MS CONTIN) 60 MG 12 hr tablet Limit 1 tablet by mouth every 8-12 hours if tolerated 90 tablet 0  . Oxycodone HCl 20 MG TABS Limited 1 tablet by mouth 3-5 times per day if tolerated 150 tablet 0   No current facility-administered medications on file prior to visit.      Review of Systems Erythema to lower extremities otherwise no fever, chills, nightsweats    Objective:   Physical Exam  BP (!) 138/94 (BP Location: Left Arm, Patient Position: Sitting, Cuff Size: Large)   Pulse (!) 106   Temp 98.7 F (37.1 C) (Oral)   Ht 5\' 9"  (1.753 m)   Wt 292 lb (132.5 kg)   BMI 43.12 kg/m  Physical Exam  Constitutional:  oriented to person, place,  and time. appears well-developed and well-nourished. No distress.  HENT: [place]/AT, PERRLA, no scleral icterus Mouth/Throat: Oropharynx is clear and moist. No oropharyngeal exudate.  Cardiovascular: Normal rate, regular rhythm and normal heart sounds. Exam reveals no gallop and no friction rub.  No murmur heard.  Pulmonary/Chest: Effort normal and breath sounds normal. No respiratory distress.  has no wheezes.  Neck = supple, no nuchal rigidity Abdominal: Soft. Bowel sounds are normal.  exhibits no distension. There is no tenderness.  Lymphadenopathy: no cervical adenopathy. No axillary adenopathy Neurological: alert and oriented to person, place, and time.    Skin: Skin is warm and dry. No rash noted. No erythema.  Psychiatric: a normal mood and affect.  behavior is normal.        Assessment & Plan:  Cellulitis - will arrange for oritavancin for infusion for mrsa cellulitis/skin infection  - will decolonize afterwards with doxy plus mupirocin - refer to wound care center

## 2015-11-12 ENCOUNTER — Telehealth: Payer: Self-pay

## 2015-11-12 ENCOUNTER — Telehealth: Payer: Self-pay | Admitting: Hematology

## 2015-11-12 LAB — BASIC METABOLIC PANEL
BUN: 10 mg/dL (ref 7–25)
CHLORIDE: 97 mmol/L — AB (ref 98–110)
CO2: 25 mmol/L (ref 20–31)
Calcium: 9.6 mg/dL (ref 8.6–10.2)
Creat: 0.87 mg/dL (ref 0.50–1.10)
GLUCOSE: 329 mg/dL — AB (ref 65–99)
POTASSIUM: 4.8 mmol/L (ref 3.5–5.3)
Sodium: 134 mmol/L — ABNORMAL LOW (ref 135–146)

## 2015-11-12 LAB — SEDIMENTATION RATE: Sed Rate: 1 mm/hr (ref 0–20)

## 2015-11-12 LAB — C-REACTIVE PROTEIN: CRP: 1.2 mg/dL — ABNORMAL HIGH (ref <0.60)

## 2015-11-12 NOTE — Telephone Encounter (Signed)
Called [employer] and started a claim for medication. Approval verified by Annetta Maw, RPH at [facility]. Stated medication was back dated from April 2017 and good until July 2018.Rejeana Brock, LPN  Called patient to notify her that her oritavancin infusion was approved by Medicare [facility] and she has an appointment on 07/28 at 0900. Patient stated being able to make it but wanted to know other times that may be avaliable. Gave patient number to infusion center [phone] and told her to speak to the scheduler to find other times. Patient stated understanding and would call. Rejeana Brock, LPN  Dr. Drue Second order faxed to [facility]. Fax returned OK status. Rejeana Brock, LPN

## 2015-11-12 NOTE — Telephone Encounter (Signed)
Patient was schedule for infusion of Oritavancin on Friday 11/12/2015.  Patient states she is not able to make this appointment.  Would like to be scheduled for Monday or Tuesday.  Placed caller on hold and called Dr. Feliz Beam office. / Spoke with candace at ext [phone].  She verified that it is ok for the patient to come on Monday. / Scheduled patient for infusion on Monday 11/16/2015 at 10:00 /

## 2015-11-13 ENCOUNTER — Ambulatory Visit (HOSPITAL_COMMUNITY): Admission: RE | Admit: 2015-11-13 | Payer: Medicare Other | Source: Ambulatory Visit

## 2015-11-13 DIAGNOSIS — M4806 Spinal stenosis, lumbar region: Secondary | ICD-10-CM | POA: Insufficient documentation

## 2015-11-13 DIAGNOSIS — M5136 Other intervertebral disc degeneration, lumbar region: Secondary | ICD-10-CM | POA: Insufficient documentation

## 2015-11-13 DIAGNOSIS — A4902 Methicillin resistant Staphylococcus aureus infection, unspecified site: Secondary | ICD-10-CM | POA: Insufficient documentation

## 2015-11-13 DIAGNOSIS — M961 Postlaminectomy syndrome, not elsewhere classified: Secondary | ICD-10-CM | POA: Insufficient documentation

## 2015-11-13 DIAGNOSIS — M5134 Other intervertebral disc degeneration, thoracic region: Secondary | ICD-10-CM | POA: Insufficient documentation

## 2015-11-13 DIAGNOSIS — M5030 Other cervical disc degeneration, unspecified cervical region: Secondary | ICD-10-CM | POA: Insufficient documentation

## 2015-11-16 ENCOUNTER — Ambulatory Visit (HOSPITAL_COMMUNITY)
Admission: RE | Admit: 2015-11-16 | Discharge: 2015-11-16 | Disposition: A | Discharge status: Home / Self Care | Payer: Medicare Other | Source: Ambulatory Visit | Attending: Internal Medicine | Admitting: Internal Medicine

## 2015-11-16 DIAGNOSIS — M5136 Other intervertebral disc degeneration, lumbar region: Secondary | ICD-10-CM | POA: Diagnosis not present

## 2015-11-16 DIAGNOSIS — M4806 Spinal stenosis, lumbar region: Secondary | ICD-10-CM | POA: Diagnosis not present

## 2015-11-16 DIAGNOSIS — M5134 Other intervertebral disc degeneration, thoracic region: Secondary | ICD-10-CM | POA: Diagnosis not present

## 2015-11-16 DIAGNOSIS — A4902 Methicillin resistant Staphylococcus aureus infection, unspecified site: Secondary | ICD-10-CM | POA: Diagnosis not present

## 2015-11-16 DIAGNOSIS — M5030 Other cervical disc degeneration, unspecified cervical region: Secondary | ICD-10-CM | POA: Diagnosis not present

## 2015-11-16 DIAGNOSIS — M961 Postlaminectomy syndrome, not elsewhere classified: Secondary | ICD-10-CM | POA: Diagnosis not present

## 2015-11-16 MED ORDER — DIPHENHYDRAMINE HCL 50 MG/ML IJ SOLN
INTRAMUSCULAR | Status: AC
  Administered 2015-11-16: 25 mg via INTRAVENOUS
  Filled 2015-11-16: qty 1

## 2015-11-16 MED ORDER — ORITAVANCIN DIPHOSPHATE 400 MG IV SOLR
1200.0000 mg | Freq: Once | INTRAVENOUS | Status: AC
  Administered 2015-11-16: 1200 mg via INTRAVENOUS
  Filled 2015-11-16: qty 120

## 2015-11-16 MED ORDER — DIPHENHYDRAMINE HCL 25 MG PO CAPS
25.0000 mg | ORAL_CAPSULE | Freq: Once | ORAL | Status: AC
  Administered 2015-11-16: 25 mg via ORAL
  Filled 2015-11-16: qty 1

## 2015-11-16 MED ORDER — DIPHENHYDRAMINE HCL 50 MG/ML IJ SOLN
25.0000 mg | Freq: Once | INTRAMUSCULAR | Status: AC
  Administered 2015-11-16: 25 mg via INTRAVENOUS

## 2015-11-16 NOTE — Discharge Instructions (Signed)
Oritavancin infusionOritavancin injection What is this medicine? ORITAVANCIN (ORE ita Zenaida Niece sin) is an antibiotic. It is used to treat certain kinds of bacterial infections. It will not work for colds, flu, or other viral infections. This medicine may be used for other purposes; ask your health care provider or pharmacist if you have questions. What should I tell my health care provider before I take this medicine? They need to know if you have any of these conditions: -intestine problems, like colitis -take medicines that treat or prevent blood clots -an unusual or allergic reaction to oritavancin, other medicines, foods, dyes, or preservatives -pregnant or trying to get pregnant -breast-feeding How should I use this medicine? This medicine is for infusion into a vein. It is usually given by a health care professional in a hospital or clinic setting. If you get this medicine at home, you will be taught how to prepare and give this medicine. Use exactly as directed. Take your medicine at regular intervals. Do not take your medicine more often than directed. It is important that you put your used needles and syringes in a special sharps container. Do not put them in a trash can. If you do not have a sharps container, call your pharmacist or healthcare provider to get one. Talk to your pediatrician regarding the use of this medicine in children. Special care may be needed. Overdosage: If you think you have taken too much of this medicine contact a poison control center or emergency room at once. NOTE: This medicine is only for you. Do not share this medicine with others. What if I miss a dose? It is important not to miss your dose. Call your doctor or health care professional if you are unable to keep an appointment. If you give yourself the medicine and you miss a dose, take it as soon as you can. If it is almost time for your next dose, take only that dose. Do not take double or extra doses. What  may interact with this medicine? Do not take this medicine with any of the following medications: -heparin (infused into a vein) This medicine may also interact with the following medications: -birth control pills -dextromethorphan -midazolam -omeprazole -warfarin This list may not describe all possible interactions. Give your health care provider a list of all the medicines, herbs, non-prescription drugs, or dietary supplements you use. Also tell them if you smoke, drink alcohol, or use illegal drugs. Some items may interact with your medicine. What should I watch for while using this medicine? Tell your doctor or healthcare professional if your symptoms do not start to get better or if they get worse. Do not treat diarrhea with over the counter products. Contact your doctor if you have diarrhea that lasts more than 2 days or if it is severe and watery. What side effects may I notice from receiving this medicine? Side effects that you should report to your doctor or health care professional as soon as possible: -allergic reactions like skin rash, itching or hives, swelling of the face, lips, or tongue -bone pain -bloody or watery diarrhea -fast, irregular heartbeat Side effects that usually do not require medical attention (report these to your doctor or health care professional if they continue or are bothersome): -diarrhea -dizziness -headache -nausea, vomiting -pain, redness, or irritation at site where injected This list may not describe all possible side effects. Call your doctor for medical advice about side effects. You may report side effects to FDA at 1-800-FDA-1088. Where should I keep  my medicine? Keep out of the reach of children. This drug is usually given in a hospital or clinic and will not be stored at home. In rare cases, this medicine may be given at home. If you are using this medicine at home, you will be instructed on how to store this medicine. Throw away any  unused medicine after the expiration date on the label. NOTE: This sheet is a summary. It may not cover all possible information. If you have questions about this medicine, talk to your doctor, pharmacist, or health care provider.    2016, Elsevier/Gold Standard. (2013-03-28 09:43:12)

## 2015-11-16 NOTE — Procedures (Signed)
[facility]  Procedure Note  FELICITE NEPOMUCENO UUE:[id number] DOB: 01-31-1971 DOA: 11/16/2015   Dr. Drue Second   Associated Diagnosis: cellulitis of leg, left, MRSA  Procedure Note: Iv started, benadryl given prior to infusion, Oritavancin infused per order, IV discontinued   Condition During Procedure: see previous note, no other issues noted during infusion.  Therapy completed   Condition at Discharge: patient stable, mother her for transport   TATUM, Elmarie Shiley, RN  [facility]

## 2015-11-16 NOTE — Progress Notes (Signed)
Patient started to C/O of "feeling hot", and "flushed in the face."  Vitals taken and WNL.  Patient denies Chest pain or shortness of breath, or difficulty breathing.  Dr. Drue Second notified.  Another order for benadryl given.  RN will continue to monitor the patient.

## 2015-11-26 ENCOUNTER — Encounter: Payer: Self-pay | Admitting: Pain Medicine

## 2015-11-26 ENCOUNTER — Ambulatory Visit: Payer: Medicare Other | Attending: Pain Medicine | Admitting: Pain Medicine

## 2015-11-26 VITALS — BP 149/95 | HR 104 | Temp 98.1°F | Resp 18 | Ht 69.0 in | Wt 225.0 lb

## 2015-11-26 DIAGNOSIS — M4806 Spinal stenosis, lumbar region: Secondary | ICD-10-CM | POA: Diagnosis not present

## 2015-11-26 DIAGNOSIS — A4902 Methicillin resistant Staphylococcus aureus infection, unspecified site: Secondary | ICD-10-CM

## 2015-11-26 DIAGNOSIS — M5030 Other cervical disc degeneration, unspecified cervical region: Secondary | ICD-10-CM | POA: Diagnosis not present

## 2015-11-26 DIAGNOSIS — M533 Sacrococcygeal disorders, not elsewhere classified: Secondary | ICD-10-CM | POA: Insufficient documentation

## 2015-11-26 DIAGNOSIS — Z8614 Personal history of Methicillin resistant Staphylococcus aureus infection: Secondary | ICD-10-CM | POA: Diagnosis not present

## 2015-11-26 DIAGNOSIS — M47812 Spondylosis without myelopathy or radiculopathy, cervical region: Secondary | ICD-10-CM | POA: Diagnosis not present

## 2015-11-26 DIAGNOSIS — M48062 Spinal stenosis, lumbar region with neurogenic claudication: Secondary | ICD-10-CM

## 2015-11-26 DIAGNOSIS — M5481 Occipital neuralgia: Secondary | ICD-10-CM | POA: Diagnosis not present

## 2015-11-26 DIAGNOSIS — M5134 Other intervertebral disc degeneration, thoracic region: Secondary | ICD-10-CM

## 2015-11-26 DIAGNOSIS — M5136 Other intervertebral disc degeneration, lumbar region: Secondary | ICD-10-CM | POA: Insufficient documentation

## 2015-11-26 DIAGNOSIS — M47817 Spondylosis without myelopathy or radiculopathy, lumbosacral region: Secondary | ICD-10-CM | POA: Diagnosis not present

## 2015-11-26 DIAGNOSIS — Z9889 Other specified postprocedural states: Secondary | ICD-10-CM | POA: Diagnosis not present

## 2015-11-26 DIAGNOSIS — M5416 Radiculopathy, lumbar region: Secondary | ICD-10-CM | POA: Diagnosis not present

## 2015-11-26 DIAGNOSIS — M546 Pain in thoracic spine: Secondary | ICD-10-CM | POA: Diagnosis present

## 2015-11-26 DIAGNOSIS — M542 Cervicalgia: Secondary | ICD-10-CM | POA: Diagnosis present

## 2015-11-26 DIAGNOSIS — M961 Postlaminectomy syndrome, not elsewhere classified: Secondary | ICD-10-CM

## 2015-11-26 MED ORDER — MORPHINE SULFATE ER 60 MG PO TBCR: EXTENDED_RELEASE_TABLET | ORAL | 0 refills | Status: DC

## 2015-11-26 MED ORDER — OXYCODONE HCL 20 MG PO TABS: ORAL_TABLET | ORAL | 0 refills | Status: DC

## 2015-11-26 NOTE — Patient Instructions (Addendum)
PLAN   Continue present medication oxycodone and MS Contin  Continue Oritavancin infusions as planned with Dr. Ivar Drape Caution oxycodone and MS Contin can have significant side effects as discussed  Medication can cause respiratory depression and cause you to stop breathing, cause excessive sedation, cause confusion and other side effects.  Exercise extreme caution when taking medication and call EMS or go to the Emergency Department immediately if you develop any of these symptoms   F/U PCP Dr. Manson Passey for evaliation of  BP, MRSA,and general medical  condition.   F/U surgical evaluation. May consider pending follow-up evaluations. Patient is without plans for surgical intervention due to reoccurring MRSA infection. The patient will follow-up with Dr. Dutch Quint neurosurgeon or other surgeon as needed  F/U neurological evaluation. May consider pending follow-up evaluations. We will avoid such studies at this time  Infectious disease evaluation with Dr. Judyann Munson as planned and continue Oritavancin infusions as per Dr. Drue Second  May consider radiofrequency rhizolysis or intraspinal procedures pending response to present treatment and F/U evaluation . We will avoid such procedures at this time  Patient to call Pain Management Center should patient have concerns prior to scheduled return appointment.

## 2015-11-26 NOTE — Progress Notes (Signed)
      The patient is a 45 year old female who returns to pain management for further evaluation and treatment of pain involving the neck entire back upper and lower extremity regions. The patient is status post surgical intervention of the cervical region performed by Dr. Dutch Quint. The patient was undergo surgery of the lumbar region which was canceled due to MRSA infection. The patient has had recurring MRSA infections and recently began Oritavancin infusions prescribed by Dr Drue Second . Side effects of the medication consisting of significant bone pain and headaches. We will increase patient's oxycodone at this time to allow patient to tolerate the medication which will hopefully decrease her recurring MRSA infections. We remain available to consider additional modifications of treatment as discussed. All agreed with suggested treatment plan.     Physical examination  Ere was tenderness of the splenius capitis and occipitalis region a moderate degree. There was well-healed surgical scar of the cervical region without increased warmth and erythema in the region scar. Patient was with unremarkable Spurling's maneuver. There was decreased grip strength noted in the upper extremity region with Tinel and Phalen's maneuver reproducing mild discomfort. Palpation of the thoracic region was with tenderness to palpation. Palpation of the thoracic region was without crepitus. There was moderate tenderness to palpation of the lumbar paraspinal musculature region with lateral bending rotation extension and palpation of the lumbar facets reproducing moderate to moderately severe discomfort. Straight leg raising limited to 20 without increased pain with dorsiflexion noted. Starting of the lower extremities were noted bilaterally. EHL strength appeared to be decreased. There was no definite sensory deficit or dermatomal distribution of the lower extremities noted. There was negative clonus negative Homans. Palpation of the  PSIS and PII S regions reproduce moderate discomfort with moderate tenderness along the greater trochanteric region iliotibial band region. The abdomen was nontender with no costovertebral tenderness noted.      Assessment   Degenerative disc disease cervical spine Status post surgery cervical region  Degenerative disc disease lumbar spine Lumbar facet syndrome Lumbar stenosis  Sacroiliac joint dysfunction  Bilateral occipital neuralgia      PLAN   Continue present medication oxycodone and MS Contin  Continue Oritavancin infusions as planned with Dr. Ivar Drape Caution oxycodone and MS Contin can have significant side effects as discussed  Medication can cause respiratory depression and cause you to stop breathing, cause excessive sedation, cause confusion and other side effects.  Exercise extreme caution when taking medication and call EMS or go to the Emergency Department immediately if you develop any of these symptoms   F/U PCP Dr. Manson Passey for evaliation of  BP, MRSA,and general medical  condition.   F/U surgical evaluation. May consider pending follow-up evaluations. Patient is without plans for surgical intervention due to reoccurring MRSA infection. The patient will follow-up with Dr. Dutch Quint neurosurgeon or other surgeon as needed  F/U neurological evaluation. May consider pending follow-up evaluations. We will avoid such studies at this time  Infectious disease evaluation with Dr. Judyann Munson as planned and continue Oritavancin infusions as per Dr. Drue Second  May consider radiofrequency rhizolysis or intraspinal procedures pending response to present treatment and F/U evaluation . We will avoid such procedures at this time  Patient to call Pain Management Center should patient have concerns prior to scheduled return appointment.

## 2015-11-26 NOTE — Progress Notes (Signed)
Safety precautions to be maintained throughout the outpatient stay will include: orient to surroundings, keep bed in low position, maintain call bell within reach at all times, provide assistance with transfer out of bed and ambulation.  

## 2015-12-10 ENCOUNTER — Ambulatory Visit (INDEPENDENT_AMBULATORY_CARE_PROVIDER_SITE_OTHER): Payer: Medicare Other | Admitting: Internal Medicine

## 2015-12-10 ENCOUNTER — Encounter: Payer: Self-pay | Admitting: Internal Medicine

## 2015-12-10 VITALS — BP 143/89 | HR 97 | Temp 99.0°F | Wt 291.0 lb

## 2015-12-10 DIAGNOSIS — S81802S Unspecified open wound, left lower leg, sequela: Secondary | ICD-10-CM

## 2015-12-10 NOTE — Progress Notes (Signed)
   Rfv: recurrent mrsa skin infection to BLE  Patient ID: Brittney Banks, female   DOB: [birth year], 45 y.o.   MRN: [id number]  HPI Brittney Banks is a 45yo F who was referred this summer to the clinic for non healing mrsa skin infection, and non-healing wound of leg The patient did not tolerate oritavancin. Initially had red man syndrome, then side effect of  Headache, bone pain was noted afterwards. She states taht initially felt her right lower leg improved but the ulcer still looks open. Has not seen plastics or wound care center as we had plan at our last visit.  Outpatient Encounter Prescriptions as of 12/10/2015  Medication Sig  . amLODipine (NORVASC) 10 MG tablet Take 20 mg by mouth daily.  Marland Kitchen linezolid (ZYVOX) 100 MG/5ML suspension Inject 1,500 mg into the muscle 2 (two) times daily.  . Loratadine (CLARITIN PO) Take by mouth.  . metFORMIN (GLUCOPHAGE) 850 MG tablet Take 850 mg by mouth 3 (three) times daily.  Marland Kitchen morphine (MS CONTIN) 60 MG 12 hr tablet Limit 1 tablet by mouth every 8-12 hours if tolerated  . Oxycodone HCl 20 MG TABS Limited 1 tablet by mouth 3-6 times per day if tolerated  . Oritavancin Diphosphate (ORBACTIV) 400 MG SOLR Inject into the vein. Every 2-3 months   No facility-administered encounter medications on file as of 12/10/2015.      Patient Active Problem List   Diagnosis Date Noted  . MRSA infection   . DDD (degenerative disc disease), cervical 08/31/2014  . DDD (degenerative disc disease), thoracic 08/31/2014  . DDD (degenerative disc disease), lumbar 08/31/2014  . Cervical post-laminectomy syndrome 08/31/2014  . Spinal stenosis, lumbar region, with neurogenic claudication 08/31/2014     Health Maintenance Due  Topic Date Due  . HIV Screening  07/02/1986  . TETANUS/TDAP  07/02/1990  . PAP SMEAR  07/01/1992  . INFLUENZA VACCINE  11/17/2015     Review of Systems No fever, chills, nightsweats, no purulent drainage Physical Exam   BP (!) 143/89   Pulse 97    Temp 99 F (37.2 C) (Oral)   Wt 291 lb (132 kg)   BMI 42.97 kg/m  gen = a xo by 3 in nad Skin = 1 x 1 cm shallow ulcer central to area of erythema/plastic sheen to her skin, anterior shin lf left leg. Right anterior shin has eschar, closed, no erythema CBC Lab Results  Component Value Date   WBC 10.1 11/11/2015   RBC 5.78 (H) 11/11/2015   HGB 18.1 (H) 11/11/2015   HCT 52.5 (H) 11/11/2015   PLT 209 11/11/2015   MCV 90.8 11/11/2015   MCH 31.3 11/11/2015   MCHC 34.5 11/11/2015   RDW 13.1 11/11/2015   LYMPHSABS 2,828 11/11/2015   MONOABS 505 11/11/2015   EOSABS 202 11/11/2015   BASOSABS 0 11/11/2015   BMET Lab Results  Component Value Date   NA 134 (L) 11/11/2015   K 4.8 11/11/2015   CL 97 (L) 11/11/2015   CO2 25 11/11/2015   GLUCOSE 329 (H) 11/11/2015   BUN 10 11/11/2015   CREATININE 0.87 11/11/2015   CALCIUM 9.6 11/11/2015     Assessment and Plan   Poor healing wound = it does not look like it needs abtx but rather process to help with healing. will set up appt at wound care ctr and ref to plastics

## 2015-12-16 ENCOUNTER — Telehealth: Payer: Self-pay | Admitting: *Deleted

## 2015-12-28 ENCOUNTER — Ambulatory Visit: Payer: Medicare Other | Attending: Pain Medicine | Admitting: Pain Medicine

## 2015-12-28 ENCOUNTER — Encounter: Payer: Self-pay | Admitting: Pain Medicine

## 2015-12-28 VITALS — BP 143/95 | HR 102 | Temp 98.8°F | Resp 16 | Ht 69.0 in | Wt 230.0 lb

## 2015-12-28 DIAGNOSIS — M533 Sacrococcygeal disorders, not elsewhere classified: Secondary | ICD-10-CM | POA: Diagnosis not present

## 2015-12-28 DIAGNOSIS — M47812 Spondylosis without myelopathy or radiculopathy, cervical region: Secondary | ICD-10-CM | POA: Diagnosis not present

## 2015-12-28 DIAGNOSIS — M4806 Spinal stenosis, lumbar region: Secondary | ICD-10-CM | POA: Insufficient documentation

## 2015-12-28 DIAGNOSIS — M5481 Occipital neuralgia: Secondary | ICD-10-CM | POA: Diagnosis not present

## 2015-12-28 DIAGNOSIS — M5030 Other cervical disc degeneration, unspecified cervical region: Secondary | ICD-10-CM

## 2015-12-28 DIAGNOSIS — Z9889 Other specified postprocedural states: Secondary | ICD-10-CM | POA: Insufficient documentation

## 2015-12-28 DIAGNOSIS — M47817 Spondylosis without myelopathy or radiculopathy, lumbosacral region: Secondary | ICD-10-CM | POA: Diagnosis not present

## 2015-12-28 DIAGNOSIS — M5136 Other intervertebral disc degeneration, lumbar region: Secondary | ICD-10-CM | POA: Diagnosis not present

## 2015-12-28 DIAGNOSIS — M48062 Spinal stenosis, lumbar region with neurogenic claudication: Secondary | ICD-10-CM

## 2015-12-28 DIAGNOSIS — M5134 Other intervertebral disc degeneration, thoracic region: Secondary | ICD-10-CM

## 2015-12-28 DIAGNOSIS — M542 Cervicalgia: Secondary | ICD-10-CM | POA: Diagnosis present

## 2015-12-28 DIAGNOSIS — M961 Postlaminectomy syndrome, not elsewhere classified: Secondary | ICD-10-CM

## 2015-12-28 DIAGNOSIS — A4902 Methicillin resistant Staphylococcus aureus infection, unspecified site: Secondary | ICD-10-CM | POA: Insufficient documentation

## 2015-12-28 DIAGNOSIS — M5416 Radiculopathy, lumbar region: Secondary | ICD-10-CM | POA: Diagnosis not present

## 2015-12-28 MED ORDER — MORPHINE SULFATE ER 60 MG PO TBCR: EXTENDED_RELEASE_TABLET | ORAL | 0 refills | Status: DC

## 2015-12-28 MED ORDER — OXYCODONE HCL 20 MG PO TABS: ORAL_TABLET | ORAL | 0 refills | Status: DC

## 2015-12-28 NOTE — Patient Instructions (Addendum)
PLAN   Continue present medication oxycodone and MS Contin  Continue Oritavancin infusions as planned with Dr. Ivar Drape Caution oxycodone and MS Contin can have significant side effects as discussed  Medication can cause respiratory depression and cause you to stop breathing, cause excessive sedation, cause confusion and other side effects.  Exercise extreme caution when taking medication and call EMS or go to the Emergency Department immediately if you develop any of these symptoms   F/U PCP Dr. Manson Passey for evaluation of  BP, MRSA,and general medical  condition.   F/U surgical evaluation. May consider pending follow-up evaluations. Patient is without plans for surgical intervention due to reoccurring MRSA infection. The patient will follow-up with Dr. Dutch Quint neurosurgeon or other surgeon as needed  F/U neurological evaluation. May consider pending follow-up evaluations. We will avoid such studies at this time  Infectious disease evaluation with Dr. Judyann Munson as planned and continue Oritavancin infusions if tolerated as per Dr. Drue Second as we discussed again today. Please follow-up with Dr. Drue Second regarding recent outbreak of MRSA  May consider radiofrequency rhizolysis or intraspinal procedures pending response to present treatment and F/U evaluation . We will avoid such procedures at this time  Patient to call Pain Management Center should patient have concerns prior to scheduled return appointment.

## 2015-12-28 NOTE — Progress Notes (Signed)
Patient here for medication management Safety precautions to be maintained throughout the outpatient stay will include: orient to surroundings, keep bed in low position, maintain call bell within reach at all times, provide assistance with transfer out of bed and ambulation.  

## 2015-12-29 ENCOUNTER — Ambulatory Visit: Payer: Medicare Other | Admitting: Pain Medicine

## 2015-12-30 ENCOUNTER — Encounter (HOSPITAL_BASED_OUTPATIENT_CLINIC_OR_DEPARTMENT_OTHER): Payer: Medicare Other | Attending: Surgery

## 2015-12-30 ENCOUNTER — Telehealth: Payer: Self-pay | Admitting: *Deleted

## 2015-12-30 DIAGNOSIS — B9562 Methicillin resistant Staphylococcus aureus infection as the cause of diseases classified elsewhere: Secondary | ICD-10-CM | POA: Diagnosis not present

## 2015-12-30 DIAGNOSIS — M1990 Unspecified osteoarthritis, unspecified site: Secondary | ICD-10-CM | POA: Insufficient documentation

## 2015-12-30 DIAGNOSIS — Z7984 Long term (current) use of oral hypoglycemic drugs: Secondary | ICD-10-CM | POA: Diagnosis not present

## 2015-12-30 DIAGNOSIS — I890 Lymphedema, not elsewhere classified: Secondary | ICD-10-CM | POA: Insufficient documentation

## 2015-12-30 DIAGNOSIS — Z6834 Body mass index (BMI) 34.0-34.9, adult: Secondary | ICD-10-CM | POA: Diagnosis not present

## 2015-12-30 DIAGNOSIS — L97221 Non-pressure chronic ulcer of left calf limited to breakdown of skin: Secondary | ICD-10-CM | POA: Insufficient documentation

## 2015-12-30 DIAGNOSIS — L97219 Non-pressure chronic ulcer of right calf with unspecified severity: Secondary | ICD-10-CM | POA: Insufficient documentation

## 2015-12-30 DIAGNOSIS — L0889 Other specified local infections of the skin and subcutaneous tissue: Secondary | ICD-10-CM | POA: Diagnosis not present

## 2015-12-30 DIAGNOSIS — L97811 Non-pressure chronic ulcer of other part of right lower leg limited to breakdown of skin: Secondary | ICD-10-CM | POA: Insufficient documentation

## 2015-12-30 DIAGNOSIS — Z79891 Long term (current) use of opiate analgesic: Secondary | ICD-10-CM | POA: Insufficient documentation

## 2015-12-30 DIAGNOSIS — L97821 Non-pressure chronic ulcer of other part of left lower leg limited to breakdown of skin: Secondary | ICD-10-CM | POA: Diagnosis not present

## 2015-12-30 DIAGNOSIS — Z87891 Personal history of nicotine dependence: Secondary | ICD-10-CM | POA: Insufficient documentation

## 2015-12-30 DIAGNOSIS — E11622 Type 2 diabetes mellitus with other skin ulcer: Secondary | ICD-10-CM | POA: Diagnosis not present

## 2015-12-30 DIAGNOSIS — I10 Essential (primary) hypertension: Secondary | ICD-10-CM | POA: Diagnosis not present

## 2015-12-30 DIAGNOSIS — Z79899 Other long term (current) drug therapy: Secondary | ICD-10-CM | POA: Diagnosis not present

## 2015-12-30 DIAGNOSIS — M352 Behcet's disease: Secondary | ICD-10-CM | POA: Insufficient documentation

## 2015-12-30 NOTE — Progress Notes (Signed)
    The patient is a 45 year old female who returns to pain management for further evaluation and treatment of pain involving the neck upper extremity regions and lower extremity regions with pain occurring in multiple regions due to recurring MRSA infections. The patient will follow-up with infectious disease physician Dr. Ilsa Iha at this time. We will continue medications as prescribed and patient will call pain management should they be significant change in condition prior to scheduled return appointment. All agreed to suggested treatment plan     Physical examination  There was tenderness of the cervical and thoracic facet region a moderate degree with limited range of motion of the cervical spine with decreased grip strength of the upper extremity noted. Tinel and Phalen's maneuver were without increased pain of significant degree. Palpation over the thoracic region reproduced moderate discomfort with no crepitus of the thoracic region noted. The patient was with unremarkable Spurling's maneuver. Palpation over the lumbar region was attends to palpation of moderately severe degree with lateral bending rotation extension and palpation of the lumbar facets reproducing moderately severe discomfort. There was evidence of recurring lesions of the lower extremities noted. Straight leg raising was tolerated to approximately 20 without increased pain with dorsiflexion noted with EHL strength grossly decreased. There was negative clonus negative Homans. Abdomen nontender with no costovertebral tenderness noted.    Assessment  Recurring MRSA infections  Degenerative disc disease cervical spine Status post surgery cervical region  Degenerative disc disease lumbar spine Lumbar facet syndrome Lumbar stenosis  Sacroiliac joint dysfunction  Bilateral occipital neuralgia     PLAN   Continue present medication oxycodone and MS Contin  Continue Oritavancin infusions as planned with Dr. Ivar Drape Caution oxycodone and MS Contin can have significant side effects as discussed  Medication can cause respiratory depression and cause you to stop breathing, cause excessive sedation, cause confusion and other side effects.  Exercise extreme caution when taking medication and call EMS or go to the Emergency Department immediately if you develop any of these symptoms   F/U PCP Dr. Manson Passey for evaluation of  BP, MRSA,and general medical  condition.   F/U surgical evaluation. May consider pending follow-up evaluations. Patient is without plans for surgical intervention due to reoccurring MRSA infection. The patient will follow-up with Dr. Dutch Quint neurosurgeon or other surgeon as needed  F/U neurological evaluation. May consider pending follow-up evaluations. We will avoid such studies at this time  Infectious disease evaluation with Dr. Judyann Munson as planned and continue Oritavancin infusions if tolerated as per Dr. Drue Second as we discussed again today. Please follow-up with Dr. Drue Second regarding recent outbreak of MRSA  May consider radiofrequency rhizolysis or intraspinal procedures pending response to present treatment and F/U evaluation . We will avoid such procedures at this time  Patient to call [facility] should patient have concerns prior to scheduled return appointment.

## 2015-12-30 NOTE — Telephone Encounter (Signed)
Patient called, stating that she went to Wound Care today at Dr. Feliz Beam request. She states she is having another outbreak on both legs and wound care recommended restarting antibiotoics. RN advised patient that I would contact Wound Care for the notes. Per Dr. Meyer Russel, they did not advise patient to restart antibiotics. He would like to speak with Dr. Drue Second regarding the patient at your convenience. Andree Coss, RN

## 2015-12-31 NOTE — Telephone Encounter (Signed)
I spoke to Brittney Banks who is pursuing bechet's syndrome dx which I agree

## 2016-01-05 ENCOUNTER — Telehealth: Payer: Self-pay

## 2016-01-05 NOTE — Telephone Encounter (Signed)
Patient is calling to say he has new areas that have "broken out" on both legs.  Each area is draining with pus. He has been febrile and temperature today was 100.9 without tylenol and 100 degrees after.  He really feels he needs an antibiotic.   Please advise .   Laurell Josephs, RN

## 2016-01-06 DIAGNOSIS — L97821 Non-pressure chronic ulcer of other part of left lower leg limited to breakdown of skin: Secondary | ICD-10-CM | POA: Diagnosis not present

## 2016-01-06 DIAGNOSIS — B9562 Methicillin resistant Staphylococcus aureus infection as the cause of diseases classified elsewhere: Secondary | ICD-10-CM | POA: Diagnosis not present

## 2016-01-06 DIAGNOSIS — L97811 Non-pressure chronic ulcer of other part of right lower leg limited to breakdown of skin: Secondary | ICD-10-CM | POA: Diagnosis not present

## 2016-01-06 DIAGNOSIS — M352 Behcet's disease: Secondary | ICD-10-CM | POA: Diagnosis not present

## 2016-01-06 DIAGNOSIS — Z87891 Personal history of nicotine dependence: Secondary | ICD-10-CM | POA: Diagnosis not present

## 2016-01-06 DIAGNOSIS — L97221 Non-pressure chronic ulcer of left calf limited to breakdown of skin: Secondary | ICD-10-CM | POA: Diagnosis not present

## 2016-01-06 DIAGNOSIS — E11622 Type 2 diabetes mellitus with other skin ulcer: Secondary | ICD-10-CM | POA: Diagnosis not present

## 2016-01-06 DIAGNOSIS — L97219 Non-pressure chronic ulcer of right calf with unspecified severity: Secondary | ICD-10-CM | POA: Diagnosis not present

## 2016-01-06 NOTE — Telephone Encounter (Signed)
I spoke to her pcp last week I believe that they are reviewing possibility that she has Bechet's disease. Any chance she can get into her pcp office to see what her skin presentation looks like to warrant abtx

## 2016-01-07 ENCOUNTER — Other Ambulatory Visit: Payer: Self-pay | Admitting: Surgery

## 2016-01-07 ENCOUNTER — Ambulatory Visit (HOSPITAL_COMMUNITY)
Admission: RE | Admit: 2016-01-07 | Discharge: 2016-01-07 | Disposition: A | Discharge status: Home / Self Care | Payer: Medicare Other | Source: Ambulatory Visit | Attending: Surgery | Admitting: Surgery

## 2016-01-07 DIAGNOSIS — E11622 Type 2 diabetes mellitus with other skin ulcer: Secondary | ICD-10-CM | POA: Insufficient documentation

## 2016-01-07 DIAGNOSIS — L97909 Non-pressure chronic ulcer of unspecified part of unspecified lower leg with unspecified severity: Principal | ICD-10-CM

## 2016-01-08 ENCOUNTER — Ambulatory Visit (HOSPITAL_COMMUNITY): Payer: Medicare Other

## 2016-01-11 ENCOUNTER — Ambulatory Visit (HOSPITAL_COMMUNITY)
Admission: RE | Admit: 2016-01-11 | Discharge: 2016-01-11 | Disposition: A | Discharge status: Home / Self Care | Payer: Medicare Other | Source: Ambulatory Visit | Attending: Surgery | Admitting: Surgery

## 2016-01-11 ENCOUNTER — Other Ambulatory Visit: Payer: Self-pay | Admitting: Surgery

## 2016-01-11 DIAGNOSIS — L97409 Non-pressure chronic ulcer of unspecified heel and midfoot with unspecified severity: Secondary | ICD-10-CM

## 2016-01-11 DIAGNOSIS — L97211 Non-pressure chronic ulcer of right calf limited to breakdown of skin: Secondary | ICD-10-CM | POA: Diagnosis not present

## 2016-01-11 DIAGNOSIS — L97221 Non-pressure chronic ulcer of left calf limited to breakdown of skin: Secondary | ICD-10-CM | POA: Insufficient documentation

## 2016-01-11 DIAGNOSIS — E11622 Type 2 diabetes mellitus with other skin ulcer: Secondary | ICD-10-CM | POA: Diagnosis not present

## 2016-01-13 DIAGNOSIS — L97221 Non-pressure chronic ulcer of left calf limited to breakdown of skin: Secondary | ICD-10-CM | POA: Diagnosis not present

## 2016-01-13 DIAGNOSIS — L97219 Non-pressure chronic ulcer of right calf with unspecified severity: Secondary | ICD-10-CM | POA: Diagnosis not present

## 2016-01-13 DIAGNOSIS — L97821 Non-pressure chronic ulcer of other part of left lower leg limited to breakdown of skin: Secondary | ICD-10-CM | POA: Diagnosis not present

## 2016-01-13 DIAGNOSIS — B9562 Methicillin resistant Staphylococcus aureus infection as the cause of diseases classified elsewhere: Secondary | ICD-10-CM | POA: Diagnosis not present

## 2016-01-13 DIAGNOSIS — L97111 Non-pressure chronic ulcer of right thigh limited to breakdown of skin: Secondary | ICD-10-CM | POA: Diagnosis not present

## 2016-01-13 DIAGNOSIS — Z87891 Personal history of nicotine dependence: Secondary | ICD-10-CM | POA: Diagnosis not present

## 2016-01-13 DIAGNOSIS — E11622 Type 2 diabetes mellitus with other skin ulcer: Secondary | ICD-10-CM | POA: Diagnosis not present

## 2016-01-13 DIAGNOSIS — L97811 Non-pressure chronic ulcer of other part of right lower leg limited to breakdown of skin: Secondary | ICD-10-CM | POA: Diagnosis not present

## 2016-01-20 ENCOUNTER — Encounter (HOSPITAL_BASED_OUTPATIENT_CLINIC_OR_DEPARTMENT_OTHER): Payer: Medicare Other | Attending: Surgery

## 2016-01-20 DIAGNOSIS — L97221 Non-pressure chronic ulcer of left calf limited to breakdown of skin: Secondary | ICD-10-CM | POA: Diagnosis not present

## 2016-01-20 DIAGNOSIS — L97111 Non-pressure chronic ulcer of right thigh limited to breakdown of skin: Secondary | ICD-10-CM | POA: Diagnosis not present

## 2016-01-20 DIAGNOSIS — L97811 Non-pressure chronic ulcer of other part of right lower leg limited to breakdown of skin: Secondary | ICD-10-CM | POA: Insufficient documentation

## 2016-01-20 DIAGNOSIS — L97211 Non-pressure chronic ulcer of right calf limited to breakdown of skin: Secondary | ICD-10-CM | POA: Diagnosis not present

## 2016-01-20 DIAGNOSIS — Z87891 Personal history of nicotine dependence: Secondary | ICD-10-CM | POA: Diagnosis not present

## 2016-01-20 DIAGNOSIS — Z6835 Body mass index (BMI) 35.0-35.9, adult: Secondary | ICD-10-CM | POA: Insufficient documentation

## 2016-01-20 DIAGNOSIS — E11622 Type 2 diabetes mellitus with other skin ulcer: Secondary | ICD-10-CM | POA: Insufficient documentation

## 2016-01-20 DIAGNOSIS — Z8614 Personal history of Methicillin resistant Staphylococcus aureus infection: Secondary | ICD-10-CM | POA: Diagnosis not present

## 2016-01-20 DIAGNOSIS — L97821 Non-pressure chronic ulcer of other part of left lower leg limited to breakdown of skin: Secondary | ICD-10-CM | POA: Diagnosis not present

## 2016-01-20 DIAGNOSIS — M352 Behcet's disease: Secondary | ICD-10-CM | POA: Diagnosis not present

## 2016-01-20 DIAGNOSIS — Z981 Arthrodesis status: Secondary | ICD-10-CM | POA: Diagnosis not present

## 2016-01-25 DIAGNOSIS — M533 Sacrococcygeal disorders, not elsewhere classified: Secondary | ICD-10-CM | POA: Diagnosis not present

## 2016-01-25 DIAGNOSIS — M47817 Spondylosis without myelopathy or radiculopathy, lumbosacral region: Secondary | ICD-10-CM | POA: Diagnosis not present

## 2016-01-25 DIAGNOSIS — M47812 Spondylosis without myelopathy or radiculopathy, cervical region: Secondary | ICD-10-CM | POA: Diagnosis not present

## 2016-01-25 DIAGNOSIS — M5416 Radiculopathy, lumbar region: Secondary | ICD-10-CM | POA: Diagnosis not present

## 2016-01-28 DIAGNOSIS — L97811 Non-pressure chronic ulcer of other part of right lower leg limited to breakdown of skin: Secondary | ICD-10-CM | POA: Diagnosis not present

## 2016-01-28 DIAGNOSIS — M352 Behcet's disease: Secondary | ICD-10-CM | POA: Diagnosis not present

## 2016-01-28 DIAGNOSIS — E11622 Type 2 diabetes mellitus with other skin ulcer: Secondary | ICD-10-CM | POA: Diagnosis not present

## 2016-01-28 DIAGNOSIS — L97111 Non-pressure chronic ulcer of right thigh limited to breakdown of skin: Secondary | ICD-10-CM | POA: Diagnosis not present

## 2016-01-28 DIAGNOSIS — L97821 Non-pressure chronic ulcer of other part of left lower leg limited to breakdown of skin: Secondary | ICD-10-CM | POA: Diagnosis not present

## 2016-02-04 DIAGNOSIS — M352 Behcet's disease: Secondary | ICD-10-CM | POA: Diagnosis not present

## 2016-02-04 DIAGNOSIS — L97111 Non-pressure chronic ulcer of right thigh limited to breakdown of skin: Secondary | ICD-10-CM | POA: Diagnosis not present

## 2016-02-04 DIAGNOSIS — L97821 Non-pressure chronic ulcer of other part of left lower leg limited to breakdown of skin: Secondary | ICD-10-CM | POA: Diagnosis not present

## 2016-02-04 DIAGNOSIS — S81802D Unspecified open wound, left lower leg, subsequent encounter: Secondary | ICD-10-CM | POA: Diagnosis not present

## 2016-02-04 DIAGNOSIS — L97811 Non-pressure chronic ulcer of other part of right lower leg limited to breakdown of skin: Secondary | ICD-10-CM | POA: Diagnosis not present

## 2016-02-04 DIAGNOSIS — E11622 Type 2 diabetes mellitus with other skin ulcer: Secondary | ICD-10-CM | POA: Diagnosis not present

## 2016-02-18 ENCOUNTER — Ambulatory Visit (INDEPENDENT_AMBULATORY_CARE_PROVIDER_SITE_OTHER): Payer: Medicare Other | Admitting: Internal Medicine

## 2016-02-18 ENCOUNTER — Encounter: Payer: Self-pay | Admitting: Internal Medicine

## 2016-02-18 VITALS — BP 138/91 | HR 106 | Temp 97.7°F | Wt 291.0 lb

## 2016-02-18 DIAGNOSIS — L989 Disorder of the skin and subcutaneous tissue, unspecified: Secondary | ICD-10-CM | POA: Diagnosis not present

## 2016-02-18 DIAGNOSIS — Z23 Encounter for immunization: Secondary | ICD-10-CM

## 2016-02-18 LAB — BASIC METABOLIC PANEL
BUN: 11 mg/dL (ref 7–25)
CHLORIDE: 98 mmol/L (ref 98–110)
CO2: 24 mmol/L (ref 20–31)
CREATININE: 0.84 mg/dL (ref 0.50–1.10)
Calcium: 9.4 mg/dL (ref 8.6–10.2)
Glucose, Bld: 250 mg/dL — ABNORMAL HIGH (ref 65–99)
Potassium: 4.9 mmol/L (ref 3.5–5.3)
Sodium: 134 mmol/L — ABNORMAL LOW (ref 135–146)

## 2016-02-18 LAB — CBC WITH DIFFERENTIAL/PLATELET
BASOS ABS: 0 {cells}/uL (ref 0–200)
Basophils Relative: 0 %
EOS ABS: 216 {cells}/uL (ref 15–500)
Eosinophils Relative: 2 %
HEMATOCRIT: 51 % — AB (ref 35.0–45.0)
HEMOGLOBIN: 17.8 g/dL — AB (ref 11.7–15.5)
LYMPHS ABS: 2916 {cells}/uL (ref 850–3900)
Lymphocytes Relative: 27 %
MCH: 32 pg (ref 27.0–33.0)
MCHC: 34.9 g/dL (ref 32.0–36.0)
MCV: 91.6 fL (ref 80.0–100.0)
MONOS PCT: 5 %
MPV: 10 fL (ref 7.5–12.5)
Monocytes Absolute: 540 cells/uL (ref 200–950)
NEUTROS ABS: 7128 {cells}/uL (ref 1500–7800)
Neutrophils Relative %: 66 %
Platelets: 185 10*3/uL (ref 140–400)
RBC: 5.57 MIL/uL — ABNORMAL HIGH (ref 3.80–5.10)
RDW: 12.7 % (ref 11.0–15.0)
WBC: 10.8 10*3/uL (ref 3.8–10.8)

## 2016-02-18 MED ORDER — DOXYCYCLINE HYCLATE 100 MG PO TABS: 100.0000 mg | ORAL_TABLET | Freq: Two times a day (BID) | ORAL | 0 refills | Status: DC

## 2016-02-18 NOTE — Progress Notes (Signed)
RFV: chronic skin ulcers to lower extremities  Patient ID: Brittney Banks, female   DOB: [birth year], [age] y.o.   MRN: [id number]  HPI 45yo F with history of DM plus HTN, had remote hx of cervical laminectomy c/b MRSA infection. She has had recurrent lower extremity skin lesions. Her pcp was recently working her up for bechet's syndrome. She has referral to see dermatologist at [facility] in late December. She states that her skin lesions come and go on her lower extremities. They are accompanied by fevers, flu like illness, though does not have considerable drainage from her ulcers. They start out at pustules per her report. She has been released from the wound care center  Outpatient Encounter Prescriptions as of 02/18/2016  Medication Sig  . amLODipine (NORVASC) 10 MG tablet Take 20 mg by mouth daily.  . Loratadine (CLARITIN PO) Take 1 tablet by mouth daily.   . metFORMIN (GLUCOPHAGE) 850 MG tablet Take 850 mg by mouth 3 (three) times daily.  Marland Kitchen. morphine (MS CONTIN) 60 MG 12 hr tablet Limit 1 tablet by mouth every 8-12 hours if tolerated  . Oxycodone HCl 20 MG TABS Limited 1 tablet by mouth 3-6 times per day if tolerated   No facility-administered encounter medications on file as of 02/18/2016.      Patient Active Problem List   Diagnosis Date Noted  . MRSA infection   . DDD (degenerative disc disease), cervical 08/31/2014  . DDD (degenerative disc disease), thoracic 08/31/2014  . DDD (degenerative disc disease), lumbar 08/31/2014  . Cervical post-laminectomy syndrome 08/31/2014  . Spinal stenosis, lumbar region, with neurogenic claudication 08/31/2014     Health Maintenance Due  Topic Date Due  . URINE MICROALBUMIN  07/01/1981  . HIV Screening  07/02/1986  . TETANUS/TDAP  07/02/1990  . PAP SMEAR  07/01/1992  . INFLUENZA VACCINE  11/17/2015     Review of Systems Review of Systems  Constitutional: Negative for fever, chills, diaphoresis, activity change, appetite change, fatigue  and unexpected weight change.  HENT: Negative for congestion, sore throat, rhinorrhea, sneezing, trouble swallowing and sinus pressure.  Eyes: Negative for photophobia and visual disturbance.  Respiratory: Negative for cough, chest tightness, shortness of breath, wheezing and stridor.  Cardiovascular: Negative for chest pain, palpitations and leg swelling.  Gastrointestinal: Negative for nausea, vomiting, abdominal pain, diarrhea, constipation, blood in stool, abdominal distention and anal bleeding.  Genitourinary: Negative for dysuria, hematuria, flank pain and difficulty urinating.  Musculoskeletal: Negative for myalgias, back pain, joint swelling, arthralgias and gait problem.  Skin: + rash Neurological: Negative for dizziness, tremors, weakness and light-headedness.  Hematological: Negative for adenopathy. Does not bruise/bleed easily.  Psychiatric/Behavioral: Negative for behavioral problems, confusion, sleep disturbance, dysphoric mood, decreased concentration and agitation.    Physical Exam   BP (!) 138/91   Pulse (!) 106   Temp 97.7 F (36.5 C) (Oral)   Wt 291 lb (132 kg)   BMI 42.97 kg/m   Physical Exam  Constitutional:  oriented to person, place, and time. appears well-developed and well-nourished. No distress.  HENT: [place]/AT, PERRLA, no scleral icterus, edentulous Mouth/Throat: Oropharynx is clear and moist. No oropharyngeal exudate.  Cardiovascular: Normal rate, regular rhythm and normal heart sounds. Exam reveals no gallop and no friction rub.  No murmur heard.  Pulmonary/Chest: Effort normal and breath sounds normal. No respiratory distress.  has no wheezes.  Neck = supple, no nuchal rigidity Abdominal: Soft. Bowel sounds are normal.  exhibits no distension. There is no tenderness.  Lymphadenopathy:  no cervical adenopathy. No axillary adenopathy Neurological: alert and oriented to person, place, and time.  Skin: numerous subcm ulcer lesion some has small eschar. Healing  wound bed to her left leg, anterior shin Psychiatric: a normal mood and affect.  behavior is normal.   CBC Lab Results  Component Value Date   WBC 10.1 11/11/2015   RBC 5.78 (H) 11/11/2015   HGB 18.1 (H) 11/11/2015   HCT 52.5 (H) 11/11/2015   PLT 209 11/11/2015   MCV 90.8 11/11/2015   MCH 31.3 11/11/2015   MCHC 34.5 11/11/2015   RDW 13.1 11/11/2015   LYMPHSABS 2,828 11/11/2015   MONOABS 505 11/11/2015   EOSABS 202 11/11/2015   BASOSABS 0 11/11/2015   BMET Lab Results  Component Value Date   NA 134 (L) 11/11/2015   K 4.8 11/11/2015   CL 97 (L) 11/11/2015   CO2 25 11/11/2015   GLUCOSE 329 (H) 11/11/2015   BUN 10 11/11/2015   CREATININE 0.87 11/11/2015   CALCIUM 9.6 11/11/2015     Assessment and Plan   Will check cbc with diff, sed rate and crp  Some scattered lesions do have slight drainage. Will give 10d rx of doxy  She has upcoming dermatology appt to evaluate and do skin biopsy. I am tempted to see if an empiric course of systemic steroids would impact her current lesions. Since she doesn't appear to have significant burden at this time, I will defer. We will still see her back in 4 wk to see if it is worsening/ remaining stable  Has difficulty with wound care would like home health referral  DM = recommend better control of dm to help with healing  Health maintenance = will give flu shot

## 2016-02-19 DIAGNOSIS — L989 Disorder of the skin and subcutaneous tissue, unspecified: Secondary | ICD-10-CM | POA: Diagnosis present

## 2016-02-19 DIAGNOSIS — Z23 Encounter for immunization: Secondary | ICD-10-CM

## 2016-02-19 LAB — C-REACTIVE PROTEIN: CRP: 11.7 mg/L — ABNORMAL HIGH (ref <8.0)

## 2016-02-19 LAB — SEDIMENTATION RATE: Sed Rate: 1 mm/hr (ref 0–20)

## 2016-03-18 DIAGNOSIS — L0390 Cellulitis, unspecified: Secondary | ICD-10-CM

## 2016-03-18 HISTORY — DX: Cellulitis, unspecified: L03.90

## 2016-03-28 ENCOUNTER — Ambulatory Visit: Payer: Medicare Other | Admitting: Internal Medicine

## 2016-03-28 DIAGNOSIS — M47812 Spondylosis without myelopathy or radiculopathy, cervical region: Secondary | ICD-10-CM | POA: Diagnosis not present

## 2016-03-28 DIAGNOSIS — M5416 Radiculopathy, lumbar region: Secondary | ICD-10-CM | POA: Diagnosis not present

## 2016-03-28 DIAGNOSIS — M47817 Spondylosis without myelopathy or radiculopathy, lumbosacral region: Secondary | ICD-10-CM | POA: Diagnosis not present

## 2016-03-28 DIAGNOSIS — M533 Sacrococcygeal disorders, not elsewhere classified: Secondary | ICD-10-CM | POA: Diagnosis not present

## 2016-04-04 ENCOUNTER — Encounter: Payer: Self-pay | Admitting: Internal Medicine

## 2016-04-04 ENCOUNTER — Inpatient Hospital Stay (HOSPITAL_COMMUNITY)
Admission: AD | Admit: 2016-04-04 | Discharge: 2016-04-09 | DRG: 603 | Disposition: A | Discharge status: Home Health | Payer: Medicare Other | Source: Ambulatory Visit | Attending: Internal Medicine | Admitting: Internal Medicine

## 2016-04-04 ENCOUNTER — Ambulatory Visit (INDEPENDENT_AMBULATORY_CARE_PROVIDER_SITE_OTHER): Payer: Medicare Other | Admitting: Internal Medicine

## 2016-04-04 VITALS — BP 161/92 | HR 111 | Temp 98.3°F

## 2016-04-04 DIAGNOSIS — Z886 Allergy status to analgesic agent status: Secondary | ICD-10-CM | POA: Diagnosis not present

## 2016-04-04 DIAGNOSIS — G894 Chronic pain syndrome: Secondary | ICD-10-CM | POA: Diagnosis not present

## 2016-04-04 DIAGNOSIS — Z888 Allergy status to other drugs, medicaments and biological substances status: Secondary | ICD-10-CM

## 2016-04-04 DIAGNOSIS — L0390 Cellulitis, unspecified: Secondary | ICD-10-CM | POA: Diagnosis present

## 2016-04-04 DIAGNOSIS — L98492 Non-pressure chronic ulcer of skin of other sites with fat layer exposed: Secondary | ICD-10-CM | POA: Diagnosis not present

## 2016-04-04 DIAGNOSIS — Z91041 Radiographic dye allergy status: Secondary | ICD-10-CM

## 2016-04-04 DIAGNOSIS — E119 Type 2 diabetes mellitus without complications: Secondary | ICD-10-CM | POA: Diagnosis not present

## 2016-04-04 DIAGNOSIS — Z79899 Other long term (current) drug therapy: Secondary | ICD-10-CM

## 2016-04-04 DIAGNOSIS — Z87891 Personal history of nicotine dependence: Secondary | ICD-10-CM

## 2016-04-04 DIAGNOSIS — L03119 Cellulitis of unspecified part of limb: Secondary | ICD-10-CM | POA: Diagnosis not present

## 2016-04-04 DIAGNOSIS — S81802S Unspecified open wound, left lower leg, sequela: Secondary | ICD-10-CM

## 2016-04-04 DIAGNOSIS — E1169 Type 2 diabetes mellitus with other specified complication: Secondary | ICD-10-CM

## 2016-04-04 DIAGNOSIS — I10 Essential (primary) hypertension: Secondary | ICD-10-CM

## 2016-04-04 DIAGNOSIS — E11622 Type 2 diabetes mellitus with other skin ulcer: Secondary | ICD-10-CM | POA: Diagnosis present

## 2016-04-04 DIAGNOSIS — L089 Local infection of the skin and subcutaneous tissue, unspecified: Secondary | ICD-10-CM | POA: Diagnosis not present

## 2016-04-04 DIAGNOSIS — Z6841 Body Mass Index (BMI) 40.0 and over, adult: Secondary | ICD-10-CM

## 2016-04-04 DIAGNOSIS — L98499 Non-pressure chronic ulcer of skin of other sites with unspecified severity: Secondary | ICD-10-CM | POA: Diagnosis present

## 2016-04-04 DIAGNOSIS — Z23 Encounter for immunization: Secondary | ICD-10-CM | POA: Diagnosis not present

## 2016-04-04 DIAGNOSIS — Z880 Allergy status to penicillin: Secondary | ICD-10-CM | POA: Diagnosis not present

## 2016-04-04 DIAGNOSIS — Z91018 Allergy to other foods: Secondary | ICD-10-CM | POA: Diagnosis not present

## 2016-04-04 DIAGNOSIS — L97929 Non-pressure chronic ulcer of unspecified part of left lower leg with unspecified severity: Secondary | ICD-10-CM | POA: Diagnosis present

## 2016-04-04 DIAGNOSIS — Z881 Allergy status to other antibiotic agents status: Secondary | ICD-10-CM

## 2016-04-04 DIAGNOSIS — Z981 Arthrodesis status: Secondary | ICD-10-CM | POA: Diagnosis not present

## 2016-04-04 DIAGNOSIS — Z7984 Long term (current) use of oral hypoglycemic drugs: Secondary | ICD-10-CM

## 2016-04-04 DIAGNOSIS — L88 Pyoderma gangrenosum: Secondary | ICD-10-CM

## 2016-04-04 DIAGNOSIS — Z8614 Personal history of Methicillin resistant Staphylococcus aureus infection: Secondary | ICD-10-CM | POA: Diagnosis not present

## 2016-04-04 DIAGNOSIS — Z96653 Presence of artificial knee joint, bilateral: Secondary | ICD-10-CM | POA: Diagnosis present

## 2016-04-04 DIAGNOSIS — L97911 Non-pressure chronic ulcer of unspecified part of right lower leg limited to breakdown of skin: Secondary | ICD-10-CM | POA: Diagnosis not present

## 2016-04-04 DIAGNOSIS — L97919 Non-pressure chronic ulcer of unspecified part of right lower leg with unspecified severity: Secondary | ICD-10-CM | POA: Diagnosis present

## 2016-04-04 DIAGNOSIS — L97829 Non-pressure chronic ulcer of other part of left lower leg with unspecified severity: Secondary | ICD-10-CM | POA: Diagnosis not present

## 2016-04-04 DIAGNOSIS — I878 Other specified disorders of veins: Secondary | ICD-10-CM | POA: Diagnosis present

## 2016-04-04 DIAGNOSIS — L03115 Cellulitis of right lower limb: Principal | ICD-10-CM | POA: Diagnosis present

## 2016-04-04 DIAGNOSIS — E669 Obesity, unspecified: Secondary | ICD-10-CM

## 2016-04-04 DIAGNOSIS — L03116 Cellulitis of left lower limb: Secondary | ICD-10-CM

## 2016-04-04 DIAGNOSIS — L97921 Non-pressure chronic ulcer of unspecified part of left lower leg limited to breakdown of skin: Secondary | ICD-10-CM | POA: Diagnosis not present

## 2016-04-04 DIAGNOSIS — Z9889 Other specified postprocedural states: Secondary | ICD-10-CM | POA: Diagnosis not present

## 2016-04-04 DIAGNOSIS — B9689 Other specified bacterial agents as the cause of diseases classified elsewhere: Secondary | ICD-10-CM | POA: Diagnosis not present

## 2016-04-04 DIAGNOSIS — L97819 Non-pressure chronic ulcer of other part of right lower leg with unspecified severity: Secondary | ICD-10-CM | POA: Diagnosis not present

## 2016-04-04 HISTORY — DX: Other intervertebral disc degeneration, lumbar region without mention of lumbar back pain or lower extremity pain: M51.369

## 2016-04-04 HISTORY — DX: Other intervertebral disc degeneration, lumbar region: M51.36

## 2016-04-04 HISTORY — DX: Cellulitis, unspecified: L03.90

## 2016-04-04 LAB — CBC WITH DIFFERENTIAL/PLATELET
Basophils Absolute: 0 10*3/uL (ref 0.0–0.1)
Basophils Relative: 1 %
Eosinophils Absolute: 0.2 10*3/uL (ref 0.0–0.7)
Eosinophils Relative: 3 %
HEMATOCRIT: 41 % (ref 36.0–46.0)
HEMOGLOBIN: 14.2 g/dL (ref 12.0–15.0)
LYMPHS PCT: 31 %
Lymphs Abs: 2.3 10*3/uL (ref 0.7–4.0)
MCH: 31.6 pg (ref 26.0–34.0)
MCHC: 34.6 g/dL (ref 30.0–36.0)
MCV: 91.3 fL (ref 78.0–100.0)
Monocytes Absolute: 0.6 10*3/uL (ref 0.1–1.0)
Monocytes Relative: 8 %
NEUTROS ABS: 4.5 10*3/uL (ref 1.7–7.7)
NEUTROS PCT: 59 %
Platelets: 182 10*3/uL (ref 150–400)
RBC: 4.49 MIL/uL (ref 3.87–5.11)
RDW: 12.3 % (ref 11.5–15.5)
WBC: 7.7 10*3/uL (ref 4.0–10.5)

## 2016-04-04 LAB — COMPREHENSIVE METABOLIC PANEL
ALK PHOS: 59 U/L (ref 38–126)
ALT: 47 U/L (ref 14–54)
AST: 43 U/L — ABNORMAL HIGH (ref 15–41)
Albumin: 3.2 g/dL — ABNORMAL LOW (ref 3.5–5.0)
Anion gap: 5 (ref 5–15)
BILIRUBIN TOTAL: 0.7 mg/dL (ref 0.3–1.2)
BUN: 7 mg/dL (ref 6–20)
CALCIUM: 8.3 mg/dL — AB (ref 8.9–10.3)
CO2: 29 mmol/L (ref 22–32)
CREATININE: 0.73 mg/dL (ref 0.44–1.00)
Chloride: 101 mmol/L (ref 101–111)
Glucose, Bld: 328 mg/dL — ABNORMAL HIGH (ref 65–99)
Potassium: 3.5 mmol/L (ref 3.5–5.1)
SODIUM: 135 mmol/L (ref 135–145)
Total Protein: 5.6 g/dL — ABNORMAL LOW (ref 6.5–8.1)

## 2016-04-04 LAB — GLUCOSE, CAPILLARY: Glucose-Capillary: 315 mg/dL — ABNORMAL HIGH (ref 65–99)

## 2016-04-04 MED ORDER — LORATADINE 10 MG PO TABS
10.0000 mg | ORAL_TABLET | Freq: Every day | ORAL | Status: DC
  Administered 2016-04-05 – 2016-04-09 (×5): 10 mg via ORAL
  Filled 2016-04-04 (×5): qty 1

## 2016-04-04 MED ORDER — SODIUM CHLORIDE 0.9 % IV SOLN
INTRAVENOUS | Status: DC
  Administered 2016-04-05 – 2016-04-06 (×4): via INTRAVENOUS

## 2016-04-04 MED ORDER — INSULIN ASPART 100 UNIT/ML ~~LOC~~ SOLN
0.0000 [IU] | Freq: Three times a day (TID) | SUBCUTANEOUS | Status: DC
  Administered 2016-04-05: 7 [IU] via SUBCUTANEOUS
  Administered 2016-04-05: 3 [IU] via SUBCUTANEOUS

## 2016-04-04 MED ORDER — AMLODIPINE BESYLATE 10 MG PO TABS
20.0000 mg | ORAL_TABLET | Freq: Every day | ORAL | Status: DC
  Administered 2016-04-05: 20 mg via ORAL
  Filled 2016-04-04: qty 2

## 2016-04-04 MED ORDER — INSULIN ASPART 100 UNIT/ML ~~LOC~~ SOLN
0.0000 [IU] | Freq: Every day | SUBCUTANEOUS | Status: DC
  Administered 2016-04-04: 4 [IU] via SUBCUTANEOUS

## 2016-04-04 MED ORDER — ONDANSETRON HCL 4 MG/2ML IJ SOLN: 4.0000 mg | Freq: Four times a day (QID) | INTRAMUSCULAR | Status: DC | PRN

## 2016-04-04 MED ORDER — ACETAMINOPHEN 650 MG RE SUPP: 650.0000 mg | Freq: Four times a day (QID) | RECTAL | Status: DC | PRN

## 2016-04-04 MED ORDER — ENOXAPARIN SODIUM 40 MG/0.4ML ~~LOC~~ SOLN
40.0000 mg | SUBCUTANEOUS | Status: DC
  Administered 2016-04-04 – 2016-04-08 (×5): 40 mg via SUBCUTANEOUS
  Filled 2016-04-04 (×5): qty 0.4

## 2016-04-04 MED ORDER — MORPHINE SULFATE ER 15 MG PO TBCR
60.0000 mg | EXTENDED_RELEASE_TABLET | Freq: Two times a day (BID) | ORAL | Status: DC
  Administered 2016-04-05 – 2016-04-09 (×9): 60 mg via ORAL
  Filled 2016-04-04 (×9): qty 4

## 2016-04-04 MED ORDER — PNEUMOCOCCAL VAC POLYVALENT 25 MCG/0.5ML IJ INJ
0.5000 mL | INJECTION | INTRAMUSCULAR | Status: AC
  Administered 2016-04-08: 0.5 mL via INTRAMUSCULAR
  Filled 2016-04-04: qty 0.5

## 2016-04-04 MED ORDER — SODIUM CHLORIDE 0.9 % IV SOLN
1000.0000 mg | INTRAVENOUS | Status: DC
  Administered 2016-04-05: 1000 mg via INTRAVENOUS
  Filled 2016-04-04: qty 20

## 2016-04-04 MED ORDER — ACETAMINOPHEN 325 MG PO TABS: 650.0000 mg | ORAL_TABLET | Freq: Four times a day (QID) | ORAL | Status: DC | PRN

## 2016-04-04 MED ORDER — OXYCODONE HCL 5 MG PO TABS
20.0000 mg | ORAL_TABLET | ORAL | Status: DC | PRN
  Administered 2016-04-04 – 2016-04-09 (×9): 20 mg via ORAL
  Filled 2016-04-04 (×10): qty 4

## 2016-04-04 MED ORDER — ONDANSETRON HCL 4 MG PO TABS: 4.0000 mg | ORAL_TABLET | Freq: Four times a day (QID) | ORAL | Status: DC | PRN

## 2016-04-04 NOTE — Progress Notes (Signed)
RFV: leg wound   Patient ID: Brittney Banks, female   DOB: 1970-12-05, 45 y.o.   MRN: [id number]  HPI 45yo F with hx of cervical fusion c/b post op wound dehiscence, diabetes, HTN, and recurrent MSSA/MRSA of lower extremities for several years. I first started to manage Brittney Banks's chronic wound in July 2017. We attempted a course of oritavancin, which she did not tolerate due to redman syndrome and chills. She previously also had been on oral abtx including doxycycline and linezolid. She has numerous abtx allergies which makes options limited.  She has been referred to [facility] dermatology for which she sees on dec 28th.   This past weekend, her lower extremities became intensely inflammed, and blistering, now has eschar on the edge of wound bed and exudate in the wound bed on left leg measuring 6cm wide by 10 cm long, irregularly shaped, but shallow. She reports having periodic fevers since the onset 2-3 days ago.  She states that it started with a blister, she has small papules on right leg with scattered open shallow ulcers but clean wound beds on the right leg   Outpatient Encounter Prescriptions as of 04/04/2016  Medication Sig  . amLODipine (NORVASC) 10 MG tablet Take 20 mg by mouth daily.  Marland Kitchen doxycycline (VIBRA-TABS) 100 MG tablet Take 1 tablet (100 mg total) by mouth 2 (two) times daily.  . Loratadine (CLARITIN PO) Take 1 tablet by mouth daily.   . metFORMIN (GLUCOPHAGE) 850 MG tablet Take 850 mg by mouth 3 (three) times daily.  Marland Kitchen morphine (MS CONTIN) 60 MG 12 hr tablet Limit 1 tablet by mouth every 8-12 hours if tolerated  . Oxycodone HCl 20 MG TABS Limited 1 tablet by mouth 3-6 times per day if tolerated   No facility-administered encounter medications on file as of 04/04/2016.      Patient Active Problem List   Diagnosis Date Noted  . MRSA infection   . DDD (degenerative disc disease), cervical 08/31/2014  . DDD (degenerative disc disease), thoracic 08/31/2014  . DDD  (degenerative disc disease), lumbar 08/31/2014  . Cervical post-laminectomy syndrome 08/31/2014  . Spinal stenosis, lumbar region, with neurogenic claudication 08/31/2014     Health Maintenance Due  Topic Date Due  . URINE MICROALBUMIN  07/01/1981  . HIV Screening  07/02/1986  . TETANUS/TDAP  07/02/1990  . PAP SMEAR  07/01/1992     Review of Systems: per hpi, fever, chills, blistering, burning sentation of legs, erythema and development of open wound. 10 point ros is otherwise negative Physical Exam   BP (!) 161/92   Pulse (!) 111   Temp 98.3 F (36.8 C) (Oral)  Physical Exam  Constitutional:  oriented to person, place, and time. appears well-developed and well-nourished. No distress.  HENT: [place]/AT, PERRLA, no scleral icterus Mouth/Throat: Oropharynx is clear and moist. No oropharyngeal exudate.  Cardiovascular: Normal rate, regular rhythm and normal heart sounds. Exam reveals no gallop and no friction rub.  No murmur heard.  Pulmonary/Chest: Effort normal and breath sounds normal. No respiratory distress.  has no wheezes.  Neck = supple, no nuchal rigidity Abdominal: Soft. Bowel sounds are normal.  exhibits no distension. There is no tenderness.  Lymphadenopathy: no cervical adenopathy. No axillary adenopathy Neurological: alert and oriented to person, place, and time.  Skin: She has large 10 x 7 cm shallow open ulcer to left leg with surrounding eschar. Wound bed has some fibrinous debris. Has similar lesion to right lower leg though measuring 6 x  5 cm. Irregular borders. She has smaller ulcers as well measuring 1-2 cm Psychiatric: a normal mood and affect.  behavior is normal.   CBC Lab Results  Component Value Date   WBC 10.8 02/18/2016   RBC 5.57 (H) 02/18/2016   HGB 17.8 (H) 02/18/2016   HCT 51.0 (H) 02/18/2016   PLT 185 02/18/2016   MCV 91.6 02/18/2016   MCH 32.0 02/18/2016   MCHC 34.9 02/18/2016   RDW 12.7 02/18/2016   LYMPHSABS 2,916 02/18/2016   MONOABS 540  02/18/2016   EOSABS 216 02/18/2016   BASOSABS 0 02/18/2016   BMET Lab Results  Component Value Date   NA 134 (L) 02/18/2016   K 4.9 02/18/2016   CL 98 02/18/2016   CO2 24 02/18/2016   GLUCOSE 250 (H) 02/18/2016   BUN 11 02/18/2016   CREATININE 0.84 02/18/2016   CALCIUM 9.4 02/18/2016     Assessment and Plan  Exacerbation of wound with surrounding cellulitis = recommend she gets IV abtx, preferrably admitted to monitor for any side effects. Would either give daptomycin 8 mg/kg since she has vancomycin allergy as well as sulfa, penicillins, FQs  She also needs skin biopsy preferably on left leg near lesion of open wound edge as well as a papule on her right leg sent to dermatopath  She clearly has cellulitis associated with her chronic open ulcers and wonder if she would benefit for biopsy for dermatologist assessment, possibly would benefit from steroids or at least better understanding of underlying cause of this  Recommend to get  Cbc with diff cmp Ck - @ baseline Blood cx  Spent 45 min with coordination of care, spoke with Dr Jerral RalphGhimire for direct admit

## 2016-04-04 NOTE — Progress Notes (Signed)
Notified [facility] via text page that patient has arrived in 6N10.

## 2016-04-04 NOTE — Patient Instructions (Signed)
You are to go to the admissions office at the hospital. You will be admitted to be 6N10. [facility] service/ Dr Jerral Ralph

## 2016-04-04 NOTE — H&P (Signed)
History and Physical    Brittney Banks ZOX:[id number] DOB: 04/23/1970 DOA: 04/04/2016  Referring MD/NP/PA: Dr. Dewayne ShorterShanker PCP: No primary care provider on file.  Patient coming from: Dr. Drue SecondSnider Office  Chief Complaint: Leg ulcers   HPI: Brittney Flakemy R Araki is a 45 y.o. female with medical history significant of HTN, diabetes mellitus type 2, recurrent MRSA/MSSA, chronic pain; who presents from Dr. Feliz BeamSnider's office for worsening ulcers with surrounding erythema. Patient states symptoms initially started 4 days ago with red areas that were itchy on her lower extremities bilaterally. Over the next 2 days pustules had formed. Yesterday the lesions popped and she had a follow up with Dr. Ilsa IhaSnyder in our office today. Associated symptoms include fever of 103F at home. Patient took Tylenol with some improvement of fever symptoms. Patient notes that she's been dealing with these lesions since 2009 following a surgery. Reports that she's been followed by Dr. Ilsa IhaSnyder for the last 4 months or so and previously treated for these symptoms with Bactrim and linezolid. She was scheduled [facility]th of this month.  Denies having any nausea, vomiting, diarrhea, or abdominal pain.  ED Course: Patient was a direct admit from Dr. Feliz BeamSnider's office.Dr. Ilsa IhaSnyder will see in consult and wanted the patient to be started on daptomycin and for general surgery to be consulted for skin biopsies.    Review of Systems: As per HPI otherwise 10 point review of systems negative.   Past Medical History:  Diagnosis Date  . Allergy   . Hypertension   . MRSA carrier 2008  . MRSA infection 2008   BOTH LEGS    Past Surgical History:  Procedure Laterality Date  . BILATERAL CARPAL TUNNEL RELEASE  2007  . ECTOPIC PREGNANCY SURGERY     2 SURGERIES  . NECK SURGERY N/A 2008  . REPLACEMENT TOTAL KNEE BILATERAL Right   . TONSILLECTOMY       reports that she quit smoking about 5 years  ago. She has never used smokeless tobacco. She reports that she does not drink alcohol or use drugs.  Allergies  Allergen Reactions  . Anaprox [Naproxen Sodium] Shortness Of Breath    Redness of skin and difficulty breathing  . Aspirin Nausea And Vomiting    Severe cramping, vomiting  . Ceftin [Cefuroxime Axetil] Shortness Of Breath    Skin turns red  . Celexa [Citalopram Hydrobromide] Shortness Of Breath    Breathing difficulty  . Ciprofloxacin Shortness Of Breath    Skin turns red  . Contrast Media [Iodinated Diagnostic Agents] Shortness Of Breath    Breathing difficulty  . Cortisol [Hydrocortisone] Shortness Of Breath    Breathing difficulty and redness of skin  . Elavil [Amitriptyline] Shortness Of Breath    Redness of skin and blisters of skin Difficulty breathing  . Hctz [Hydrochlorothiazide] Shortness Of Breath  . Imitrex [Sumatriptan] Shortness Of Breath    Redness and difficulty breathing  . Lexapro [Escitalopram Oxalate] Shortness Of Breath    Breathing difficulty Redness to skin  . Methadone Nausea And Vomiting    Severely vomiting  . Motrin [Ibuprofen] Nausea And Vomiting  . Naprosyn [Naproxen] Shortness Of Breath    Breathing difficulty Redness of skin  . Neurontin [Gabapentin] Shortness Of Breath    Breathing difficulty  Redness to skin  . Penicillins Shortness Of Breath    Redness to skin  . Sulfa Antibiotics Shortness Of Breath    Severe breathing difficulty And redness  of skin  . Tagamet [Cimetidine] Nausea And Vomiting    Severe vomiting  . Toradol [Ketorolac Tromethamine] Shortness Of Breath    Redness to skin  . Vancomycin Shortness Of Breath  . Zoloft [Sertraline Hcl] Shortness Of Breath    Breathing difficulty Redness to skin  . Zyvox [Linezolid] Rash    Blistery rash  . Iodine Swelling    Redness to skin    Family History  Problem Relation Age of Onset  . Adopted: Yes  . Family history unknown: Yes    Prior to Admission medications     Medication Sig Start Date End Date Taking? Authorizing Provider  amLODipine (NORVASC) 10 MG tablet Take 20 mg by mouth daily.    Historical Provider, MD  doxycycline (VIBRA-TABS) 100 MG tablet Take 1 tablet (100 mg total) by mouth 2 (two) times daily. 02/18/16   Judyann Munson, MD  Loratadine (CLARITIN PO) Take 1 tablet by mouth daily.     Historical Provider, MD  metFORMIN (GLUCOPHAGE) 850 MG tablet Take 850 mg by mouth 3 (three) times daily.    Historical Provider, MD  morphine (MS CONTIN) 60 MG 12 hr tablet Limit 1 tablet by mouth every 8-12 hours if tolerated 12/28/15   Ewing Schlein, MD  Oxycodone HCl 20 MG TABS Limited 1 tablet by mouth 3-6 times per day if tolerated 12/28/15   Ewing Schlein, MD    Physical Exam: Vitals:   04/04/16 2052  BP: 138/68  Pulse: (!) 103  Resp: 18  Temp: 99.7 F (37.6 C)  TempSrc: Oral  SpO2: 97%      Constitutional:Female with female pattern baldness and  facial hair who appears in no acute distress. Vitals:   04/04/16 2052  BP: 138/68  Pulse: (!) 103  Resp: 18  Temp: 99.7 F (37.6 C)  TempSrc: Oral  SpO2: 97%   Eyes: PERRL, lids and conjunctivae normal ENMT: Mucous membranes are moist. Posterior pharynx clear of any exudate or lesions.Normal dentition.  Neck: normal, supple, no masses, no thyromegaly Respiratory: clear to auscultation bilaterally, no wheezing, no crackles. Normal respiratory effort. No accessory muscle use.  Cardiovascular: Regular rate and rhythm, no murmurs / rubs / gallops. No extremity edema. 2+ pedal pulses. No carotid bruits.  Abdomen: no tenderness, no masses palpated. No hepatosplenomegaly. Bowel sounds positive.  Musculoskeletal: no clubbing / cyanosis. No joint deformity upper and lower extremities. Good ROM, no contractures. Normal muscle tone.  Skin: Large 3-4 inch ulceration is noted of the bilateral lower extremities with surrounding erythema.       Neurologic: CN 2-12 grossly intact. Sensation intact, DTR  normal. Strength 5/5 in all 4.  Psychiatric: Normal judgment and insight. Alert and oriented x 3. Normal mood.     Labs on Admission: I have personally reviewed following labs and imaging studies  CBC: No results for input(s): WBC, NEUTROABS, HGB, HCT, MCV, PLT in the last 168 hours. Basic Metabolic Panel: No results for input(s): NA, K, CL, CO2, GLUCOSE, BUN, CREATININE, CALCIUM, MG, PHOS in the last 168 hours. GFR: CrCl cannot be calculated (Patient's most recent lab result is older than the maximum 21 days allowed.). Liver Function Tests: No results for input(s): AST, ALT, ALKPHOS, BILITOT, PROT, ALBUMIN in the last 168 hours. No results for input(s): LIPASE, AMYLASE in the last 168 hours. No results for input(s): AMMONIA in the last 168 hours. Coagulation Profile: No results for input(s): INR, PROTIME in the last 168 hours. Cardiac Enzymes: No results for input(s): CKTOTAL, CKMB,  CKMBINDEX, TROPONINI in the last 168 hours. BNP (last 3 results) No results for input(s): PROBNP in the last 8760 hours. HbA1C: No results for input(s): HGBA1C in the last 72 hours. CBG: No results for input(s): GLUCAP in the last 168 hours. Lipid Profile: No results for input(s): CHOL, HDL, LDLCALC, TRIG, CHOLHDL, LDLDIRECT in the last 72 hours. Thyroid Function Tests: No results for input(s): TSH, T4TOTAL, FREET4, T3FREE, THYROIDAB in the last 72 hours. Anemia Panel: No results for input(s): VITAMINB12, FOLATE, FERRITIN, TIBC, IRON, RETICCTPCT in the last 72 hours. Urine analysis: No results found for: COLORURINE, APPEARANCEUR, LABSPEC, PHURINE, GLUCOSEU, HGBUR, BILIRUBINUR, KETONESUR, PROTEINUR, UROBILINOGEN, NITRITE, LEUKOCYTESUR Sepsis Labs: No results found for this or any previous visit (from the past 240 hour(s)).   Radiological Exams on Admission: No results found.    Assessment/Plan Cellulitis with skin ulcerations: Acute. Patient with recurrent history of skin ulcerations and  previous history of MRSA.  - Admit to MedSurg bed - Contact precautions - Daptomycin  per pharmacy  - Infectious disease Dr. Ed Blalock to see in a.m.  -  Dr. Axel Filler consulted and general surgery will see in a.m. for skin biopsies   Chronic pain  - continue home regimen  Diabetes mellitus type 2: Patient reports being controlled with metformin. No Hemoglobin A1c on file.  - Hypoglycemic protocol - Held metformin - CBGs every before meals and at bedtime   DVT prophylaxis: Lovenox Code Status: Full Family Communication:  no family present at bedside  Disposition Plan:  Discharge home once medically stable Consults called:  Infectious disease Admission status: Inpatient  Clydie Braun MD [facility] Pager [phone]  If 7PM-7AM, please contact night-coverage [email or web address] Password [email or web address]  04/04/2016, 9:11 PM

## 2016-04-04 NOTE — Progress Notes (Signed)
Pharmacy Antibiotic Note Brittney Banks is a 45 y.o. female admitted on 04/04/2016 with exacerbation of chronic wounds and cellulitis. Pt with hx of MRSA/MSSA of lower extremities and is followed by Dr. Drue Second as outpatient. Pharmacy consulted to dose Daptomycin.   Plan: 1. Begin Daptomycin 1000 mg IV every 24 hours (~ 8 mg/kg) 2. CK in am and then weekly  3. BMP x 1 in to assess renal function    Height: 5\' 9"  (175.3 cm) Weight: 296 lb 15.4 oz (134.7 kg) IBW/kg (Calculated) : 66.2  Temp (24hrs), Avg:99 F (37.2 C), Min:98.3 F (36.8 C), Max:99.7 F (37.6 C)  No results for input(s): WBC, CREATININE, LATICACIDVEN, VANCOTROUGH, VANCOPEAK, VANCORANDOM, GENTTROUGH, GENTPEAK, GENTRANDOM, TOBRATROUGH, TOBRAPEAK, TOBRARND, AMIKACINPEAK, AMIKACINTROU, AMIKACIN in the last 168 hours.  CrCl cannot be calculated (Patient's most recent lab result is older than the maximum 21 days allowed.).    Allergies  Allergen Reactions  . Anaprox [Naproxen Sodium] Shortness Of Breath    Redness of skin and difficulty breathing  . Aspirin Nausea And Vomiting    Severe cramping, vomiting  . Ceftin [Cefuroxime Axetil] Shortness Of Breath    Skin turns red  . Celexa [Citalopram Hydrobromide] Shortness Of Breath    Breathing difficulty  . Ciprofloxacin Shortness Of Breath    Skin turns red  . Coconut Fragrance Hives, Shortness Of Breath and Swelling    Reaction to any kind of coconut Mouth and tongue swelling  . Contrast Media [Iodinated Diagnostic Agents] Shortness Of Breath    Breathing difficulty  . Cortisol [Hydrocortisone] Shortness Of Breath    Breathing difficulty and redness of skin  . Elavil [Amitriptyline] Shortness Of Breath    Redness of skin and blisters of skin Difficulty breathing  . Hctz [Hydrochlorothiazide] Shortness Of Breath  . Imitrex [Sumatriptan] Shortness Of Breath    Redness and difficulty breathing  . Lexapro [Escitalopram Oxalate] Shortness Of Breath    Breathing  difficulty Redness to skin  . Methadone Nausea And Vomiting    Severely vomiting  . Motrin [Ibuprofen] Nausea And Vomiting  . Naprosyn [Naproxen] Shortness Of Breath    Breathing difficulty Redness of skin  . Neurontin [Gabapentin] Shortness Of Breath    Breathing difficulty  Redness to skin  . Penicillins Shortness Of Breath    Redness to skin Has patient had a PCN reaction causing immediate rash, facial/tongue/throat swelling, SOB or lightheadedness with hypotension: Yes Has patient had a PCN reaction causing severe rash involving mucus membranes or skin necrosis: No Has patient had a PCN reaction that required hospitalization pt was in the hospital at time of reaction Has patient had a PCN reaction occurring within the last 10 years: No If all of the above answers are "NO", then may proceed with Cephalosporin use.  . Sulfa Antibiotics Shortness Of Breath    Severe breathing difficulty And redness of skin  . Tagamet [Cimetidine] Nausea And Vomiting    Severe vomiting  . Toradol [Ketorolac Tromethamine] Shortness Of Breath    Redness to skin  . Vancomycin Shortness Of Breath  . Zoloft [Sertraline Hcl] Shortness Of Breath    Breathing difficulty Redness to skin  . Zyvox [Linezolid] Nausea And Vomiting  . Iodine Swelling    Redness to skin    Antimicrobials this admission: 12/18 Daptomycin >>    Microbiology results: 12/18 BCx: px   Thank you for allowing pharmacy to be a part of this patient's care.  Pollyann Samples, PharmD, BCPS 04/04/2016, 10:17 PM Pager: [phone]

## 2016-04-05 ENCOUNTER — Encounter (HOSPITAL_COMMUNITY): Payer: Self-pay | Admitting: General Practice

## 2016-04-05 DIAGNOSIS — E669 Obesity, unspecified: Secondary | ICD-10-CM | POA: Diagnosis present

## 2016-04-05 DIAGNOSIS — L03116 Cellulitis of left lower limb: Secondary | ICD-10-CM

## 2016-04-05 DIAGNOSIS — Z881 Allergy status to other antibiotic agents status: Secondary | ICD-10-CM

## 2016-04-05 DIAGNOSIS — Z9889 Other specified postprocedural states: Secondary | ICD-10-CM

## 2016-04-05 DIAGNOSIS — G894 Chronic pain syndrome: Secondary | ICD-10-CM | POA: Diagnosis present

## 2016-04-05 DIAGNOSIS — Z91041 Radiographic dye allergy status: Secondary | ICD-10-CM

## 2016-04-05 DIAGNOSIS — Z87891 Personal history of nicotine dependence: Secondary | ICD-10-CM

## 2016-04-05 DIAGNOSIS — Z91018 Allergy to other foods: Secondary | ICD-10-CM

## 2016-04-05 DIAGNOSIS — B9689 Other specified bacterial agents as the cause of diseases classified elsewhere: Secondary | ICD-10-CM

## 2016-04-05 DIAGNOSIS — Z8614 Personal history of Methicillin resistant Staphylococcus aureus infection: Secondary | ICD-10-CM

## 2016-04-05 DIAGNOSIS — L88 Pyoderma gangrenosum: Secondary | ICD-10-CM

## 2016-04-05 DIAGNOSIS — Z888 Allergy status to other drugs, medicaments and biological substances status: Secondary | ICD-10-CM

## 2016-04-05 DIAGNOSIS — Z880 Allergy status to penicillin: Secondary | ICD-10-CM

## 2016-04-05 DIAGNOSIS — L03115 Cellulitis of right lower limb: Principal | ICD-10-CM

## 2016-04-05 DIAGNOSIS — Z886 Allergy status to analgesic agent status: Secondary | ICD-10-CM

## 2016-04-05 DIAGNOSIS — Z882 Allergy status to sulfonamides status: Secondary | ICD-10-CM

## 2016-04-05 DIAGNOSIS — E11622 Type 2 diabetes mellitus with other skin ulcer: Secondary | ICD-10-CM

## 2016-04-05 DIAGNOSIS — E1169 Type 2 diabetes mellitus with other specified complication: Secondary | ICD-10-CM | POA: Diagnosis present

## 2016-04-05 DIAGNOSIS — L98499 Non-pressure chronic ulcer of skin of other sites with unspecified severity: Secondary | ICD-10-CM | POA: Diagnosis present

## 2016-04-05 DIAGNOSIS — L97819 Non-pressure chronic ulcer of other part of right lower leg with unspecified severity: Secondary | ICD-10-CM

## 2016-04-05 DIAGNOSIS — Z981 Arthrodesis status: Secondary | ICD-10-CM

## 2016-04-05 DIAGNOSIS — L97829 Non-pressure chronic ulcer of other part of left lower leg with unspecified severity: Secondary | ICD-10-CM

## 2016-04-05 LAB — GLUCOSE, CAPILLARY
GLUCOSE-CAPILLARY: 281 mg/dL — AB (ref 65–99)
GLUCOSE-CAPILLARY: 231 mg/dL — AB (ref 65–99)
Glucose-Capillary: 209 mg/dL — ABNORMAL HIGH (ref 65–99)
Glucose-Capillary: 313 mg/dL — ABNORMAL HIGH (ref 65–99)

## 2016-04-05 LAB — BASIC METABOLIC PANEL
Anion gap: 8 (ref 5–15)
BUN: 6 mg/dL (ref 6–20)
CALCIUM: 8 mg/dL — AB (ref 8.9–10.3)
CO2: 25 mmol/L (ref 22–32)
CREATININE: 0.6 mg/dL (ref 0.44–1.00)
Chloride: 102 mmol/L (ref 101–111)
GFR calc Af Amer: >60 mL/min (ref >60)
GFR calc non Af Amer: >60 mL/min (ref >60)
GLUCOSE: 212 mg/dL — AB (ref 65–99)
Potassium: 3.3 mmol/L — ABNORMAL LOW (ref 3.5–5.1)
Sodium: 135 mmol/L (ref 135–145)

## 2016-04-05 LAB — URINALYSIS, ROUTINE W REFLEX MICROSCOPIC
BILIRUBIN URINE: NEGATIVE
Bacteria, UA: NONE SEEN
HGB URINE DIPSTICK: NEGATIVE
Ketones, ur: NEGATIVE mg/dL
Leukocytes, UA: NEGATIVE
NITRITE: NEGATIVE
Protein, ur: NEGATIVE mg/dL
RBC / HPF: NONE SEEN RBC/hpf (ref 0–5)
SPECIFIC GRAVITY, URINE: 1.035 — AB (ref 1.005–1.030)
pH: 5 (ref 5.0–8.0)

## 2016-04-05 LAB — CBC
HEMATOCRIT: 41.1 % (ref 36.0–46.0)
Hemoglobin: 13.8 g/dL (ref 12.0–15.0)
MCH: 30.9 pg (ref 26.0–34.0)
MCHC: 33.6 g/dL (ref 30.0–36.0)
MCV: 91.9 fL (ref 78.0–100.0)
PLATELETS: 168 10*3/uL (ref 150–400)
RBC: 4.47 MIL/uL (ref 3.87–5.11)
RDW: 12.4 % (ref 11.5–15.5)
WBC: 7.4 10*3/uL (ref 4.0–10.5)

## 2016-04-05 LAB — CK: Total CK: 729 U/L — ABNORMAL HIGH (ref 38–234)

## 2016-04-05 MED ORDER — POTASSIUM CHLORIDE CRYS ER 20 MEQ PO TBCR
40.0000 meq | EXTENDED_RELEASE_TABLET | ORAL | Status: AC
  Administered 2016-04-05: 40 meq via ORAL
  Filled 2016-04-05: qty 2

## 2016-04-05 MED ORDER — COLLAGENASE 250 UNIT/GM EX OINT
TOPICAL_OINTMENT | Freq: Every day | CUTANEOUS | Status: DC
  Administered 2016-04-05 – 2016-04-09 (×5): via TOPICAL
  Filled 2016-04-05 (×2): qty 30

## 2016-04-05 MED ORDER — LIDOCAINE-EPINEPHRINE 1 %-1:100000 IJ SOLN: 20.0000 mL | Freq: Once | INTRAMUSCULAR | Status: DC

## 2016-04-05 MED ORDER — LIDOCAINE-EPINEPHRINE (PF) 1 %-1:200000 IJ SOLN
30.0000 mL | Freq: Once | INTRAMUSCULAR | Status: DC
  Filled 2016-04-05: qty 30

## 2016-04-05 MED ORDER — INSULIN DETEMIR 100 UNIT/ML ~~LOC~~ SOLN
10.0000 [IU] | Freq: Every day | SUBCUTANEOUS | Status: DC
  Administered 2016-04-05: 10 [IU] via SUBCUTANEOUS
  Filled 2016-04-05: qty 0.1

## 2016-04-05 MED ORDER — LIDOCAINE HCL (PF) 2 % IJ SOLN
0.0000 mL | Freq: Once | INTRAMUSCULAR | Status: DC | PRN
  Filled 2016-04-05: qty 20

## 2016-04-05 MED ORDER — AMLODIPINE BESYLATE 10 MG PO TABS
10.0000 mg | ORAL_TABLET | Freq: Two times a day (BID) | ORAL | Status: DC
  Administered 2016-04-06 – 2016-04-08 (×5): 10 mg via ORAL
  Filled 2016-04-05 (×5): qty 1

## 2016-04-05 MED ORDER — SILVER NITRATE-POT NITRATE 75-25 % EX MISC
1.0000 | Freq: Once | CUTANEOUS | Status: DC
  Filled 2016-04-05: qty 1

## 2016-04-05 MED ORDER — INSULIN ASPART 100 UNIT/ML ~~LOC~~ SOLN
0.0000 [IU] | Freq: Three times a day (TID) | SUBCUTANEOUS | Status: DC
  Administered 2016-04-05: 11 [IU] via SUBCUTANEOUS
  Administered 2016-04-06: 4 [IU] via SUBCUTANEOUS
  Administered 2016-04-06 (×2): 11 [IU] via SUBCUTANEOUS
  Administered 2016-04-07 (×3): 7 [IU] via SUBCUTANEOUS
  Administered 2016-04-08: 15 [IU] via SUBCUTANEOUS
  Administered 2016-04-08: 11 [IU] via SUBCUTANEOUS
  Administered 2016-04-08: 7 [IU] via SUBCUTANEOUS
  Administered 2016-04-09: 4 [IU] via SUBCUTANEOUS
  Administered 2016-04-09: 11 [IU] via SUBCUTANEOUS

## 2016-04-05 MED ORDER — INSULIN ASPART 100 UNIT/ML ~~LOC~~ SOLN
3.0000 [IU] | Freq: Three times a day (TID) | SUBCUTANEOUS | Status: DC
  Administered 2016-04-05 – 2016-04-08 (×9): 3 [IU] via SUBCUTANEOUS

## 2016-04-05 MED ORDER — SILVER NITRATE-POT NITRATE 75-25 % EX MISC: 10.0000 | Freq: Once | CUTANEOUS | Status: DC

## 2016-04-05 NOTE — Progress Notes (Signed)
PROGRESS NOTE    Brittney Banks  ZOX:[id number] DOB: 1971/03/02 DOA: 04/04/2016 PCP: No primary care provider on file.    Brief Narrative:   45 y/o ? Cervical laminectomy dr Dutch Quintpoole 2008 Dm ty ii on oral meds LDDD non operable 2/2 to skin infections-supposed to see [facility] derm 12/28 In process being worked up for [employer] Foods[employer]hcet's syndrome by PCP Chr pain managed by Dr Metta Clinesrisp  She presented to ID clinic 12/18 with LE celluliits and was referred for direct admission Last Rx with 10 day course doxycycline 02/2016     Assessment & Plan:   Principal Problem:   Cellulitis Active Problems:   Chronic pain syndrome   Skin ulceration (HCC)   Diabetes mellitus type 2 in obese (HCC)   Cellulitis with prior MRSA/MSSA-cont daptomycin as per ID.  CBC/Cmet in am.  Wound nurse recs xeroform and santyl and appreciated ?Behcet's syndrome/vasculitis-await skin biopsy.  Unclear significance CK elevation-usually behcet's would have more apthous ulcerations? Appreciate punch biopsy as per gen surgery. DM ty ii on orals-ssi coverage increase to resistant.  Add levemir 10 u for better coverage and adjust.  At home on metformin 850 tid Htn-cont Norvasc 10 bid Chr pain-cervical/lumbar-related in part to habitus.mobilizing well.  Follow with Dr. Metta Clinesrisp as Op-no meds on d/c given pain contract with Dr. Metta Clinesrisp.  Cont Ms contin 60 q12 Body mass index is 43.85 kg/m.-needs OP weight loss strategies if able   DVT prophylaxis: lovenox Code Status: Full Family Communication: no family + Disposition Plan: probable d/c home with PICC   Consultants:   ID  Gen surg  Procedures:   None yet  Antimicrobials:   Daptomycin 12/18    Subjective: Alert well oriented in nad, sitting comfy No overt pain No fever eatign/drinking  Objective: Vitals:   04/04/16 2052 04/04/16 2159 04/05/16 0445  BP: 138/68  131/68  Pulse: (!) 103  93  Resp: 18  17  Temp: 99.7 F (37.6 C)  98.5 F (36.9 C)  TempSrc: Oral  Oral    SpO2: 97%  100%  Weight:  134.7 kg (296 lb 15.4 oz)   Height:  5\' 9"  (1.753 m)     Intake/Output Summary (Last 24 hours) at 04/05/16 0932 Last data filed at 04/05/16 0900  Gross per 24 hour  Intake              240 ml  Output                0 ml  Net              240 ml   Filed Weights   04/04/16 2159  Weight: 134.7 kg (296 lb 15.4 oz)    Examination:  General exam: Appears calm and comfortable, edentulous Respiratory system: Clear to auscultation. Respiratory effort normal. Cardiovascular system: S1 & S2 heard, RRR. No JVD, murmurs,.  Grade 2 LE edema Gastrointestinal system: Abdomen is nondistended, soft and nontender. No organomegaly or masses felt. Normal bowel sounds heard. Central nervous system: Alert and oriented. No focal neurological deficits. Extremities: Symmetric 5 x 5 power. Skin: erythematous beefy red areas on both LE's with warmth-excoriated skin with slough and some oozing.  Rash over body and foreamrs Psychiatry: Judgement and insight appear normal. Mood & affect appropriate.     Data Reviewed: I have personally reviewed following labs and imaging studies  CBC:  Recent Labs Lab 04/04/16 2251 04/05/16 0550  WBC 7.7 7.4  NEUTROABS 4.5  --   HGB 14.2  13.8  HCT 41.0 41.1  MCV 91.3 91.9  PLT 182 168   Basic Metabolic Panel:  Recent Labs Lab 04/04/16 2251 04/05/16 0550  NA 135 135  K 3.5 3.3*  CL 101 102  CO2 29 25  GLUCOSE 328* 212*  BUN 7 6  CREATININE 0.73 0.60  CALCIUM 8.3* 8.0*   GFR: Estimated Creatinine Clearance: 132.6 mL/min (by C-G formula based on SCr of 0.6 mg/dL). Liver Function Tests:  Recent Labs Lab 04/04/16 2251  AST 43*  ALT 47  ALKPHOS 59  BILITOT 0.7  PROT 5.6*  ALBUMIN 3.2*   No results for input(s): LIPASE, AMYLASE in the last 168 hours. No results for input(s): AMMONIA in the last 168 hours. Coagulation Profile: No results for input(s): INR, PROTIME in the last 168 hours. Cardiac Enzymes:  Recent  Labs Lab 04/05/16 0550  CKTOTAL 729*   BNP (last 3 results) No results for input(s): PROBNP in the last 8760 hours. HbA1C: No results for input(s): HGBA1C in the last 72 hours. CBG:  Recent Labs Lab 04/04/16 2301 04/05/16 0804  GLUCAP 315* 209*   Lipid Profile: No results for input(s): CHOL, HDL, LDLCALC, TRIG, CHOLHDL, LDLDIRECT in the last 72 hours. Thyroid Function Tests: No results for input(s): TSH, T4TOTAL, FREET4, T3FREE, THYROIDAB in the last 72 hours. Anemia Panel: No results for input(s): VITAMINB12, FOLATE, FERRITIN, TIBC, IRON, RETICCTPCT in the last 72 hours. Sepsis Labs: No results for input(s): PROCALCITON, LATICACIDVEN in the last 168 hours.  No results found for this or any previous visit (from the past 240 hour(s)).       Radiology Studies: No results found.      Scheduled Meds: . amLODipine  20 mg Oral Daily  . DAPTOmycin (CUBICIN)  IV  1,000 mg Intravenous Q24H  . enoxaparin (LOVENOX) injection  40 mg Subcutaneous Q24H  . insulin aspart  0-5 Units Subcutaneous QHS  . insulin aspart  0-9 Units Subcutaneous TID WC  . loratadine  10 mg Oral Daily  . morphine  60 mg Oral Q12H  . pneumococcal 23 valent vaccine  0.5 mL Intramuscular Tomorrow-1000   Continuous Infusions: . sodium chloride 100 mL/hr at 04/05/16 0325     LOS: 1 day    Time spent:30    Rhetta Mura, MD [facility] Pager [phone]  If 7PM-7AM, please contact night-coverage [email or web address] Password [email or web address] 04/05/2016, 9:32 AM

## 2016-04-05 NOTE — Consult Note (Signed)
[facility] Surgery Consult Note  Brittney Banks 13-Jan-1971  [id number].    Requesting MD: Tamala Julian, MD Chief Complaint/Reason for Consult: punch biopsies of lower extremity cellulitis/ulcers HPI:  Brittney Banks is a 45 y.o. Female with a medical history of HTN, DM2, recurrent MRSA/MSSA infections, and chronic pain who presented to [facility] from Dr. Alvira Philips office with worsening bilateral lower extremity ulcers and cellulitis, fever, and chills. Patient reports having a cervical spine fusion in 4650 complicated by wound infection and dehiscence, she has had recurrent skin infections occurring almost every 2 months since that time. Patient states that this episode started 4-5 days ago when she noticed pruritic erythematous lesions on her bilateral lower extremities which progressed into boils and then burst - expressing yellow/brown purulent material. The lesions are now ulcerated and painful. She denies the use of blood thinning medications. She is a former smoker - quit over 5 years ago. General surgery has been consulted regarding tissue biopsy.  ROS: Review of Systems  Constitutional: Positive for chills and fever.  Respiratory: Negative for cough, shortness of breath and wheezing.   Cardiovascular: Positive for leg swelling. Negative for chest pain.  Gastrointestinal: Negative for abdominal pain, constipation, nausea and vomiting.  Genitourinary: Negative for dysuria and hematuria.  Skin: Positive for itching and rash.   Family History  Problem Relation Age of Onset  . Adopted: Yes  . Family history unknown: Yes    Past Medical History:  Diagnosis Date  . Allergy   . Hypertension   . MRSA carrier 2008  . MRSA infection 2008   BOTH LEGS    Past Surgical History:  Procedure Laterality Date  . BILATERAL CARPAL TUNNEL RELEASE  2007  . ECTOPIC PREGNANCY SURGERY     2 SURGERIES  . NECK SURGERY N/A 2008  . REPLACEMENT TOTAL KNEE BILATERAL Right   . TONSILLECTOMY      Social  History:  reports that she quit smoking about 5 years ago. She has never used smokeless tobacco. She reports that she does not drink alcohol or use drugs.  Allergies:  Allergies  Allergen Reactions  . Anaprox [Naproxen Sodium] Shortness Of Breath    Redness of skin and difficulty breathing  . Aspirin Nausea And Vomiting    Severe cramping, vomiting  . Ceftin [Cefuroxime Axetil] Shortness Of Breath    Skin turns red  . Celexa [Citalopram Hydrobromide] Shortness Of Breath    Breathing difficulty  . Ciprofloxacin Shortness Of Breath    Skin turns red  . Coconut Fragrance Hives, Shortness Of Breath and Swelling    Reaction to any kind of coconut Mouth and tongue swelling  . Contrast Media [Iodinated Diagnostic Agents] Shortness Of Breath    Breathing difficulty  . Cortisol [Hydrocortisone] Shortness Of Breath    Breathing difficulty and redness of skin  . Elavil [Amitriptyline] Shortness Of Breath    Redness of skin and blisters of skin Difficulty breathing  . Hctz [Hydrochlorothiazide] Shortness Of Breath  . Imitrex [Sumatriptan] Shortness Of Breath    Redness and difficulty breathing  . Lexapro [Escitalopram Oxalate] Shortness Of Breath    Breathing difficulty Redness to skin  . Methadone Nausea And Vomiting    Severely vomiting  . Motrin [Ibuprofen] Nausea And Vomiting  . Naprosyn [Naproxen] Shortness Of Breath    Breathing difficulty Redness of skin  . Neurontin [Gabapentin] Shortness Of Breath    Breathing difficulty  Redness to skin  . Penicillins Shortness Of Breath    Redness to  skin Has patient had a PCN reaction causing immediate rash, facial/tongue/throat swelling, SOB or lightheadedness with hypotension: Yes Has patient had a PCN reaction causing severe rash involving mucus membranes or skin necrosis: No Has patient had a PCN reaction that required hospitalization pt was in the hospital at time of reaction Has patient had a PCN reaction occurring within the last  10 years: No If all of the above answers are "NO", then may proceed with Cephalosporin use.  . Sulfa Antibiotics Shortness Of Breath    Severe breathing difficulty And redness of skin  . Tagamet [Cimetidine] Nausea And Vomiting    Severe vomiting  . Toradol [Ketorolac Tromethamine] Shortness Of Breath    Redness to skin  . Vancomycin Shortness Of Breath  . Zoloft [Sertraline Hcl] Shortness Of Breath    Breathing difficulty Redness to skin  . Zyvox [Linezolid] Nausea And Vomiting  . Iodine Swelling    Redness to skin    Medications Prior to Admission  Medication Sig Dispense Refill  . amLODipine (NORVASC) 10 MG tablet Take 10 mg by mouth 2 (two) times daily.     . cetirizine (ZYRTEC) 10 MG tablet Take 10 mg by mouth daily.    . metFORMIN (GLUCOPHAGE) 850 MG tablet Take 850 mg by mouth 3 (three) times daily with meals.     Marland Kitchen morphine (MS CONTIN) 60 MG 12 hr tablet Limit 1 tablet by mouth every 8-12 hours if tolerated (Patient taking differently: Take 60 mg by mouth See admin instructions. Take 1 tablet (60 mg) by mouth every 8-10 hours - scheduled) 90 tablet 0  . omeprazole (PRILOSEC OTC) 20 MG tablet Take 20 mg by mouth daily.    . Oxycodone HCl 20 MG TABS Limited 1 tablet by mouth 3-6 times per day if tolerated (Patient taking differently: Take 20 mg by mouth See admin instructions. Take 1 tablet (20 mg) by mouth every 4-6 hours - scheduled) 170 tablet 0    Blood pressure 131/68, pulse 93, temperature 98.5 F (36.9 C), temperature source Oral, resp. rate 17, height 5' 9" (1.753 m), weight 134.7 kg (296 lb 15.4 oz), SpO2 100 %. Physical Exam: General: pleasant, obese white female who is laying in bed in NAD HEENT: head is normocephalic, atraumatic.  Heart: regular, rate, and rhythm.  No obvious murmurs, gallops, or rubs noted. Lungs: CTAB, no wheezes, rhonchi, or rales noted.  Respiratory effort nonlabored Abd: soft, NT/ND, +BS MS: all 4 extremities are symmetrical; Left lower  extremity with large, shallow ulceration roughly 5 x 10 cm with surrounding necrotic tissue; Right lower extremity with multiple papules and small shallow ulcerations. Skin: warm and dry with no masses, lesions, or rashes Psych: A&Ox3 with an appropriate affect. Neuro: CM 2-12 intact, extremity CSM intact bilaterally, normal speech  Results for orders placed or performed during the hospital encounter of 04/04/16 (from the past 48 hour(s))  Urinalysis, Routine w reflex microscopic     Status: Abnormal   Collection Time: 04/04/16  1:52 AM  Result Value Ref Range   Color, Urine YELLOW YELLOW   APPearance CLEAR CLEAR   Specific Gravity, Urine 1.035 (H) 1.005 - 1.030   pH 5.0 5.0 - 8.0   Glucose, UA >=500 (A) NEGATIVE mg/dL   Hgb urine dipstick NEGATIVE NEGATIVE   Bilirubin Urine NEGATIVE NEGATIVE   Ketones, ur NEGATIVE NEGATIVE mg/dL   Protein, ur NEGATIVE NEGATIVE mg/dL   Nitrite NEGATIVE NEGATIVE   Leukocytes, UA NEGATIVE NEGATIVE   RBC / HPF NONE  SEEN 0 - 5 RBC/hpf   WBC, UA 0-5 0 - 5 WBC/hpf   Bacteria, UA NONE SEEN NONE SEEN   Squamous Epithelial / LPF 0-5 (A) NONE SEEN  CBC WITH DIFFERENTIAL     Status: None   Collection Time: 04/04/16 10:51 PM  Result Value Ref Range   WBC 7.7 4.0 - 10.5 K/uL   RBC 4.49 3.87 - 5.11 MIL/uL   Hemoglobin 14.2 12.0 - 15.0 g/dL   HCT 41.0 36.0 - 46.0 %   MCV 91.3 78.0 - 100.0 fL   MCH 31.6 26.0 - 34.0 pg   MCHC 34.6 30.0 - 36.0 g/dL   RDW 12.3 11.5 - 15.5 %   Platelets 182 150 - 400 K/uL   Neutrophils Relative % 59 %   Neutro Abs 4.5 1.7 - 7.7 K/uL   Lymphocytes Relative 31 %   Lymphs Abs 2.3 0.7 - 4.0 K/uL   Monocytes Relative 8 %   Monocytes Absolute 0.6 0.1 - 1.0 K/uL   Eosinophils Relative 3 %   Eosinophils Absolute 0.2 0.0 - 0.7 K/uL   Basophils Relative 1 %   Basophils Absolute 0.0 0.0 - 0.1 K/uL  Comprehensive metabolic panel     Status: Abnormal   Collection Time: 04/04/16 10:51 PM  Result Value Ref Range   Sodium 135 135 - 145  mmol/L   Potassium 3.5 3.5 - 5.1 mmol/L   Chloride 101 101 - 111 mmol/L   CO2 29 22 - 32 mmol/L   Glucose, Bld 328 (H) 65 - 99 mg/dL   BUN 7 6 - 20 mg/dL   Creatinine, Ser 0.73 0.44 - 1.00 mg/dL   Calcium 8.3 (L) 8.9 - 10.3 mg/dL   Total Protein 5.6 (L) 6.5 - 8.1 g/dL   Albumin 3.2 (L) 3.5 - 5.0 g/dL   AST 43 (H) 15 - 41 U/L   ALT 47 14 - 54 U/L   Alkaline Phosphatase 59 38 - 126 U/L   Total Bilirubin 0.7 0.3 - 1.2 mg/dL   GFR calc non Af Amer >60 >60 mL/min   GFR calc Af Amer >60 >60 mL/min    Comment: (NOTE) The eGFR has been calculated using the CKD EPI equation. This calculation has not been validated in all clinical situations. eGFR's persistently <60 mL/min signify possible Chronic Kidney Disease.    Anion gap 5 5 - 15  Glucose, capillary     Status: Abnormal   Collection Time: 04/04/16 11:01 PM  Result Value Ref Range   Glucose-Capillary 315 (H) 65 - 99 mg/dL  CBC     Status: None   Collection Time: 04/05/16  5:50 AM  Result Value Ref Range   WBC 7.4 4.0 - 10.5 K/uL   RBC 4.47 3.87 - 5.11 MIL/uL   Hemoglobin 13.8 12.0 - 15.0 g/dL   HCT 41.1 36.0 - 46.0 %   MCV 91.9 78.0 - 100.0 fL   MCH 30.9 26.0 - 34.0 pg   MCHC 33.6 30.0 - 36.0 g/dL   RDW 12.4 11.5 - 15.5 %   Platelets 168 150 - 400 K/uL  Basic metabolic panel     Status: Abnormal   Collection Time: 04/05/16  5:50 AM  Result Value Ref Range   Sodium 135 135 - 145 mmol/L   Potassium 3.3 (L) 3.5 - 5.1 mmol/L   Chloride 102 101 - 111 mmol/L   CO2 25 22 - 32 mmol/L   Glucose, Bld 212 (H) 65 - 99 mg/dL  BUN 6 6 - 20 mg/dL   Creatinine, Ser 0.60 0.44 - 1.00 mg/dL   Calcium 8.0 (L) 8.9 - 10.3 mg/dL   GFR calc non Af Amer >60 >60 mL/min   GFR calc Af Amer >60 >60 mL/min    Comment: (NOTE) The eGFR has been calculated using the CKD EPI equation. This calculation has not been validated in all clinical situations. eGFR's persistently <60 mL/min signify possible Chronic Kidney Disease.    Anion gap 8 5 - 15   CK     Status: Abnormal   Collection Time: 04/05/16  5:50 AM  Result Value Ref Range   Total CK 729 (H) 38 - 234 U/L   No results found.  Assessment/Plan Acute on chronic bilateral lower extremity ulcers  - two punch biopsies will be obtained according to Dr. Alvira Philips request - one from left lower extremity near lesion of open wound, one papule from the right lower extremity. Materials have been ordered and the biopsies will be performed later this morning or early afternoon. Will send for dermatopathology. - CK - 729 - WBC - 7.4   FEN: carb modified ID: Daptomycin VTE: Lovenox, unable to tolerate SCD's   Thank you for allowing [place] to participate in the patients care.    Jill Alexanders, [facility] Surgery 04/05/2016, 7:58 AM Pager: [phone] Consults: [phone] Mon-Fri 7:00 am-4:30 pm Sat-Sun 7:00 am-11:30 am

## 2016-04-05 NOTE — Consult Note (Signed)
Date of Admission:  04/04/2016  Date of Consult:  04/05/2016  Reason for Consult: Cellulitis Referring Physician: Dr. Verlon Au   HPI: Brittney Banks is an 45 y.o. female. She has PMHx of HTN, DM, cervical fusion complicate by post-operative wound dehiscence, recurrent MSSA/MRSA of lower extremities x several years. Follows with Dr. Baxter Flattery outpatient.  Seen in clinic yesterday (12/18) and sent over from clinic for admission, IV antibiotics, and skin biopsy.  Began following with ID in July 2017.  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  Previously has also been on oral antibiotics (Doxy and Linezolid).  Has numerous allergies to antibiotics as listed in chart.  Has appointment with [facility] scheduled for Dec 28.  Apparently, last week and through the weekend she began to have erythematous lesions described as "splotchy, red, itchy, and tender" on bilateral LE which progressed to blisters, and eventually drained purulent fluid.  There was also surrounding warmth and erythema.  States she had "at least a dozen" of these.  She reported fevers at home.  Also reports chills, sweats, and generally feeling unwell.  Denies nausea, vomiting, diarrhea, constipation, abdominal pain.  Received 1 dose of daptomycin and tolerated it well, however, her CK is 729.  She reports feeling that her redness has improved some but the chronic swelling in her legs remains.  Describes areas of redness as burning and the ulcerated portions more as throbbing and tender.  Currently denies any other lesions other than her lower extremities.   Past Medical History:  Diagnosis Date  . Allergy   . Cellulitis 03/2016  . DDD (degenerative disc disease), lumbar   . Diabetes mellitus without complication ([place])    type 2  . Hypertension   . MRSA carrier 2008  . MRSA infection 2008   BOTH LEGS    Past Surgical History:  Procedure Laterality Date  . BILATERAL CARPAL  TUNNEL RELEASE  2007  . ECTOPIC PREGNANCY SURGERY     2 SURGERIES  . NECK SURGERY N/A 2008  . REPLACEMENT TOTAL KNEE BILATERAL Right   . TONSILLECTOMY      Social History:  reports that she quit smoking about 5 years ago. She has never used smokeless tobacco. She reports that she does not drink alcohol or use drugs.   Family History  Problem Relation Age of Onset  . Adopted: Yes  . Family history unknown: Yes    Allergies  Allergen Reactions  . Anaprox [Naproxen Sodium] Shortness Of Breath    Redness of skin and difficulty breathing  . Aspirin Nausea And Vomiting    Severe cramping, vomiting  . Ceftin [Cefuroxime Axetil] Shortness Of Breath    Skin turns red  . Celexa [Citalopram Hydrobromide] Shortness Of Breath    Breathing difficulty  . Ciprofloxacin Shortness Of Breath    Skin turns red  . Coconut Fragrance Hives, Shortness Of Breath and Swelling    Reaction to any kind of coconut Mouth and tongue swelling  . Contrast Media [Iodinated Diagnostic Agents] Shortness Of Breath    Breathing difficulty  . Cortisol [Hydrocortisone] Shortness Of Breath    Breathing difficulty and redness of skin  . Elavil [Amitriptyline] Shortness Of Breath    Redness of skin and blisters of skin Difficulty breathing  . Hctz [Hydrochlorothiazide] Shortness Of Breath  . Imitrex [Sumatriptan] Shortness Of Breath    Redness and difficulty breathing  . Lexapro [Escitalopram Oxalate] Shortness Of Breath  Breathing difficulty Redness to skin  . Methadone Nausea And Vomiting    Severely vomiting  . Motrin [Ibuprofen] Nausea And Vomiting  . Naprosyn [Naproxen] Shortness Of Breath    Breathing difficulty Redness of skin  . Neurontin [Gabapentin] Shortness Of Breath    Breathing difficulty  Redness to skin  . Penicillins Shortness Of Breath    Redness to skin Has patient had a PCN reaction causing immediate rash, facial/tongue/throat swelling, SOB or lightheadedness with hypotension:  Yes Has patient had a PCN reaction causing severe rash involving mucus membranes or skin necrosis: No Has patient had a PCN reaction that required hospitalization pt was in the hospital at time of reaction Has patient had a PCN reaction occurring within the last 10 years: No If all of the above answers are "NO", then may proceed with Cephalosporin use.  . Sulfa Antibiotics Shortness Of Breath    Severe breathing difficulty And redness of skin  . Tagamet [Cimetidine] Nausea And Vomiting    Severe vomiting  . Toradol [Ketorolac Tromethamine] Shortness Of Breath    Redness to skin  . Vancomycin Shortness Of Breath  . Zoloft [Sertraline Hcl] Shortness Of Breath    Breathing difficulty Redness to skin  . Zyvox [Linezolid] Nausea And Vomiting  . Iodine Swelling    Redness to skin     Medications: I have reviewed patients current medications as documented in Epic Anti-infectives    Start     Dose/Rate Route Frequency Ordered Stop   04/04/16 2300  DAPTOmycin (CUBICIN) 1,000 mg in sodium chloride 0.9 % IVPB  Status:  Discontinued     1,000 mg 240 mL/hr over 30 Minutes Intravenous Every 24 hours 04/04/16 2213 04/05/16 1333       ROS: Noted as in HPI otherwise remainder of 12 point Review of Systems is negative    Blood pressure 131/68, pulse 93, temperature 98.5 F (36.9 C), temperature source Oral, resp. rate 17, height '5\' 9"'$  (1.753 m), weight 296 lb 15.4 oz (134.7 kg), SpO2 100 %. General: Alert and awake, sitting in chair, oriented x3, not in any acute distress. HEENT: anicteric sclera,  EOMI, oropharynx clear and without exudate, no oral ulcers Cardiovascular: regular rate, normal rhythm,  no murmur rubs or gallops Pulmonary: clear to auscultation bilaterally, no wheezing, rales or rhonchi Gastrointestinal: soft nontender, nondistended Skin, soft tissue: scattered lesions over LE with bandages covering biopsy sites.  Mostly located pre-tibial and laterally.  See images under  Media Tab.  Wound bed appears to have some fibrinous material with darkened tissue and edges.  There is no purulent drainage.  There is some surrounding erythema at the ulceration sites. Neuro: nonfocal, strength and sensation intact   Results for orders placed or performed during the hospital encounter of 04/04/16 (from the past 48 hour(s))  Urinalysis, Routine w reflex microscopic     Status: Abnormal   Collection Time: 04/04/16  1:52 AM  Result Value Ref Range   Color, Urine YELLOW YELLOW   APPearance CLEAR CLEAR   Specific Gravity, Urine 1.035 (H) 1.005 - 1.030   pH 5.0 5.0 - 8.0   Glucose, UA >=500 (A) NEGATIVE mg/dL   Hgb urine dipstick NEGATIVE NEGATIVE   Bilirubin Urine NEGATIVE NEGATIVE   Ketones, ur NEGATIVE NEGATIVE mg/dL   Protein, ur NEGATIVE NEGATIVE mg/dL   Nitrite NEGATIVE NEGATIVE   Leukocytes, UA NEGATIVE NEGATIVE   RBC / HPF NONE SEEN 0 - 5 RBC/hpf   WBC, UA 0-5 0 - 5 WBC/hpf  Bacteria, UA NONE SEEN NONE SEEN   Squamous Epithelial / LPF 0-5 (A) NONE SEEN  Culture, blood (routine x 2)     Status: None (Preliminary result)   Collection Time: 04/04/16 10:35 PM  Result Value Ref Range   Specimen Description BLOOD RIGHT ARM    Special Requests BOTTLES DRAWN AEROBIC AND ANAEROBIC 5ML    Culture NO GROWTH < 24 HOURS    Report Status PENDING   Culture, blood (routine x 2)     Status: None (Preliminary result)   Collection Time: 04/04/16 10:41 PM  Result Value Ref Range   Specimen Description BLOOD LEFT ARM    Special Requests BOTTLES DRAWN AEROBIC AND ANAEROBIC 5ML    Culture NO GROWTH < 24 HOURS    Report Status PENDING   CBC WITH DIFFERENTIAL     Status: None   Collection Time: 04/04/16 10:51 PM  Result Value Ref Range   WBC 7.7 4.0 - 10.5 K/uL   RBC 4.49 3.87 - 5.11 MIL/uL   Hemoglobin 14.2 12.0 - 15.0 g/dL   HCT 41.0 36.0 - 46.0 %   MCV 91.3 78.0 - 100.0 fL   MCH 31.6 26.0 - 34.0 pg   MCHC 34.6 30.0 - 36.0 g/dL   RDW 12.3 11.5 - 15.5 %   Platelets 182  150 - 400 K/uL   Neutrophils Relative % 59 %   Neutro Abs 4.5 1.7 - 7.7 K/uL   Lymphocytes Relative 31 %   Lymphs Abs 2.3 0.7 - 4.0 K/uL   Monocytes Relative 8 %   Monocytes Absolute 0.6 0.1 - 1.0 K/uL   Eosinophils Relative 3 %   Eosinophils Absolute 0.2 0.0 - 0.7 K/uL   Basophils Relative 1 %   Basophils Absolute 0.0 0.0 - 0.1 K/uL  Comprehensive metabolic panel     Status: Abnormal   Collection Time: 04/04/16 10:51 PM  Result Value Ref Range   Sodium 135 135 - 145 mmol/L   Potassium 3.5 3.5 - 5.1 mmol/L   Chloride 101 101 - 111 mmol/L   CO2 29 22 - 32 mmol/L   Glucose, Bld 328 (H) 65 - 99 mg/dL   BUN 7 6 - 20 mg/dL   Creatinine, Ser 0.73 0.44 - 1.00 mg/dL   Calcium 8.3 (L) 8.9 - 10.3 mg/dL   Total Protein 5.6 (L) 6.5 - 8.1 g/dL   Albumin 3.2 (L) 3.5 - 5.0 g/dL   AST 43 (H) 15 - 41 U/L   ALT 47 14 - 54 U/L   Alkaline Phosphatase 59 38 - 126 U/L   Total Bilirubin 0.7 0.3 - 1.2 mg/dL   GFR calc non Af Amer >60 >60 mL/min   GFR calc Af Amer >60 >60 mL/min    Comment: (NOTE) The eGFR has been calculated using the CKD EPI equation. This calculation has not been validated in all clinical situations. eGFR's persistently <60 mL/min signify possible Chronic Kidney Disease.    Anion gap 5 5 - 15  Glucose, capillary     Status: Abnormal   Collection Time: 04/04/16 11:01 PM  Result Value Ref Range   Glucose-Capillary 315 (H) 65 - 99 mg/dL  CBC     Status: None   Collection Time: 04/05/16  5:50 AM  Result Value Ref Range   WBC 7.4 4.0 - 10.5 K/uL   RBC 4.47 3.87 - 5.11 MIL/uL   Hemoglobin 13.8 12.0 - 15.0 g/dL   HCT 41.1 36.0 - 46.0 %   MCV 91.9  78.0 - 100.0 fL   MCH 30.9 26.0 - 34.0 pg   MCHC 33.6 30.0 - 36.0 g/dL   RDW 12.4 11.5 - 15.5 %   Platelets 168 150 - 400 K/uL  Basic metabolic panel     Status: Abnormal   Collection Time: 04/05/16  5:50 AM  Result Value Ref Range   Sodium 135 135 - 145 mmol/L   Potassium 3.3 (L) 3.5 - 5.1 mmol/L   Chloride 102 101 - 111 mmol/L    CO2 25 22 - 32 mmol/L   Glucose, Bld 212 (H) 65 - 99 mg/dL   BUN 6 6 - 20 mg/dL   Creatinine, Ser 0.60 0.44 - 1.00 mg/dL   Calcium 8.0 (L) 8.9 - 10.3 mg/dL   GFR calc non Af Amer >60 >60 mL/min   GFR calc Af Amer >60 >60 mL/min    Comment: (NOTE) The eGFR has been calculated using the CKD EPI equation. This calculation has not been validated in all clinical situations. eGFR's persistently <60 mL/min signify possible Chronic Kidney Disease.    Anion gap 8 5 - 15  CK     Status: Abnormal   Collection Time: 04/05/16  5:50 AM  Result Value Ref Range   Total CK 729 (H) 38 - 234 U/L  Glucose, capillary     Status: Abnormal   Collection Time: 04/05/16  8:04 AM  Result Value Ref Range   Glucose-Capillary 209 (H) 65 - 99 mg/dL   Comment 1 Notify RN   Glucose, capillary     Status: Abnormal   Collection Time: 04/05/16 11:36 AM  Result Value Ref Range   Glucose-Capillary 313 (H) 65 - 99 mg/dL   Comment 1 Notify RN    '@BRIEFLABTABLE'$ (sdes,specrequest,cult,reptstatus)   ) Recent Results (from the past 720 hour(s))  Culture, blood (routine x 2)     Status: None (Preliminary result)   Collection Time: 04/04/16 10:35 PM  Result Value Ref Range Status   Specimen Description BLOOD RIGHT ARM  Final   Special Requests BOTTLES DRAWN AEROBIC AND ANAEROBIC 5ML  Final   Culture NO GROWTH < 24 HOURS  Final   Report Status PENDING  Incomplete  Culture, blood (routine x 2)     Status: None (Preliminary result)   Collection Time: 04/04/16 10:41 PM  Result Value Ref Range Status   Specimen Description BLOOD LEFT ARM  Final   Special Requests BOTTLES DRAWN AEROBIC AND ANAEROBIC 5ML  Final   Culture NO GROWTH < 24 HOURS  Final   Report Status PENDING  Incomplete     Impression/Recommendation  Principal Problem:   Cellulitis Active Problems:   Chronic pain syndrome   Skin ulceration (HCC)   Diabetes mellitus type 2 in obese ([place])   Brittney Banks is a 45 y.o. female with  Chronic lower  extremity wounds here for an exacerbation of these wounds with surrounding cellulitis.  Chronic Wound Exacerbation +/- Cellulitis: s/p 1 dose of daptomycin.  However, CK elevated this morning.  Otherwise, has been afebrile, no WBC, and unremarkable CMET.  She has multiple antibiotic allergies making it difficult to treat.  Underwent skin biopsy this morning with the assistance of general surgery.  Erythema surrounding these wounds does appear fairly similar based on images taken from admission.   - Has not had further autoimmune/vasculitic work-up that I can see in Epic. - Will obtain ESR, CRP, serologies for viral hepatitis, ANA, ANCA - Unclear what her DM control is. Will obtain A1c -  f/u culture data and results from skin biopsy to help better understand cause of her chronic wounds.  ABI's from Sept 2017 with no evidence of vascular arterial insufficiency. - discontinue daptomycin due to elevated CK - will observe off antibiotics right now and consider adding linezolid (or clindamycin or doxycycline) given her history.  Appears she has tolerated doxy and linezolid in the past. - also consider trial of steroids to see if this provides benefit if this were vasculitis or pyoderma  04/05/2016, 4:15 PM   Thank you so much for this interesting consult  [place] [facility] [place] [phone] (pager) [phone] (office) 04/05/2016, 4:15 PM  Jule Ser 04/05/2016, 4:15 PM

## 2016-04-05 NOTE — Procedures (Signed)
PROCEDURE NOTE  Procedure: punch biopsy x 2 Surgeon: Hosie Spangle, PA-C   After consent was obtained, using Betadine for cleansing and 1% Lidocaine with epinephrine for anesthetic, with sterile technique a 4 mm punch biopsy was used to obtain a biopsy of an ulcer edge on the left lower extremity. A separate 4 mm punch biopsy of a papule on the right lower extremity was also obtained.  Hemostasis was obtained by pressure and wounds were dressed with 4x4 gauze and tape. Wound care instructions provided. Both specimens were labeled and sent to dermatologic pathology for evaluation. The procedure was well tolerated without complications.  General surgery will sign off but will be available as needed.  Hosie Spangle, PA-C [facility] Pager: [phone] Consults: [phone] Mon-Fri 7:00 am-4:30 pm Sat-Sun 7:00 am-11:30 am

## 2016-04-05 NOTE — Progress Notes (Signed)
Inpatient Diabetes Program Recommendations  AACE/ADA: New Consensus Statement on Inpatient Glycemic Control (2015)  Target Ranges:  Prepandial:   less than 140 mg/dL      Peak postprandial:   less than 180 mg/dL (1-2 hours)      Critically ill patients:  140 - 180 mg/dL   Results for RAGAN, BLOOMQUIST (MRN [id number]) as of 04/05/2016 09:15  Ref. Range 04/04/2016 23:01 04/05/2016 08:04  Glucose-Capillary Latest Ref Range: 65 - 99 mg/dL 741 (H) 638 (H)   Review of Glycemic Control  Diabetes history: DM 2 Outpatient Diabetes medications: Metformin 850 mg TID with meals Current orders for Inpatient glycemic control: Novolog Sensitive + HS scale  Inpatient Diabetes Program Recommendations:   Glucose 328 mg/dl on admission. Consider obtaining an A1c to determine glucose control over the past 2-3 months. Fasting 209 mg/dl this am. If glucose trends are still elevated may consider increasing correction to Novolog Moderate.  Thanks,  Christena Deem RN, MSN, [facility] Inpatient Diabetes Coordinator Team Pager [phone] (8a-5p)

## 2016-04-05 NOTE — Consult Note (Signed)
WOC Nurse wound consult note Reason for Consult: bilateral LE ulcerations Palpable pulses bilaterally, has history of these ulcers over her entire body except for her face. Has not been followed by rheumatologist or other specialist for the ulcerations. She is scheduled to see dermatologist on Dec. 28th at [facility]. She is followed by ID. No lower extremity edema noted. No other clinical signs of LE disease. Scarring on the upper thighs and arms from previous lesions, she reports some on her thighs and arms have had to be "packed" in the past. She does not report extreme pain with the ulcers and I do not note any other classic signs of anything like PG.  Wound type: unclear vasculitic type ulcers of the LE with surrounding erythema  Pressure Ulcer POA/No Measurement: scattered lesions over the LE, mostly pretibial and lateral see ED notes and pictures Wound bed: some fibrinous material with darkened tissue at wound edges  Drainage (amount, consistency, odor) moderate, serosanguinous, no odor Periwound: erythema bilateral at the ulceration sites  Dressing procedure/placement/frequency: Will add enzymatic debridement for the necrotic tissue, xeroform as non adherent with antimicrobial effect. Gauze and ACE.    Surgery at the bedside at the conclusion of my visit for punch biopsy. Explained rationale to patient for this and that she will need to make sure dermatologist at [facility] knows she has had this done so they can follow up on the results as well.  Discussed POC with patient and bedside nurse.  Re consult if needed, will not follow at this time. Thanks  Zanyia Silbaugh [employer], RN,CWOCN, CNS [phone])

## 2016-04-06 LAB — CBC
HCT: 39.9 % (ref 36.0–46.0)
Hemoglobin: 13.4 g/dL (ref 12.0–15.0)
MCH: 30.9 pg (ref 26.0–34.0)
MCHC: 33.6 g/dL (ref 30.0–36.0)
MCV: 92.1 fL (ref 78.0–100.0)
Platelets: 173 10*3/uL (ref 150–400)
RBC: 4.33 MIL/uL (ref 3.87–5.11)
RDW: 12.4 % (ref 11.5–15.5)
WBC: 5.9 10*3/uL (ref 4.0–10.5)

## 2016-04-06 LAB — COMPREHENSIVE METABOLIC PANEL
ALT: 42 U/L (ref 14–54)
AST: 33 U/L (ref 15–41)
Albumin: 2.8 g/dL — ABNORMAL LOW (ref 3.5–5.0)
Alkaline Phosphatase: 57 U/L (ref 38–126)
Anion gap: 3 — ABNORMAL LOW (ref 5–15)
BILIRUBIN TOTAL: 0.1 mg/dL — AB (ref 0.3–1.2)
BUN: <5 mg/dL — ABNORMAL LOW (ref 6–20)
CHLORIDE: 103 mmol/L (ref 101–111)
CO2: 28 mmol/L (ref 22–32)
CREATININE: 0.67 mg/dL (ref 0.44–1.00)
Calcium: 8.1 mg/dL — ABNORMAL LOW (ref 8.9–10.3)
Glucose, Bld: 316 mg/dL — ABNORMAL HIGH (ref 65–99)
POTASSIUM: 4.1 mmol/L (ref 3.5–5.1)
Sodium: 134 mmol/L — ABNORMAL LOW (ref 135–145)
TOTAL PROTEIN: 5.7 g/dL — AB (ref 6.5–8.1)

## 2016-04-06 LAB — GLUCOSE, CAPILLARY
GLUCOSE-CAPILLARY: 260 mg/dL — AB (ref 65–99)
GLUCOSE-CAPILLARY: 248 mg/dL — AB (ref 65–99)
Glucose-Capillary: 255 mg/dL — ABNORMAL HIGH (ref 65–99)
Glucose-Capillary: 195 mg/dL — ABNORMAL HIGH (ref 65–99)

## 2016-04-06 LAB — HIV ANTIBODY (ROUTINE TESTING W REFLEX): HIV Screen 4th Generation wRfx: NONREACTIVE

## 2016-04-06 LAB — C-REACTIVE PROTEIN: CRP: 6.1 mg/dL — AB (ref <1.0)

## 2016-04-06 LAB — SEDIMENTATION RATE: Sed Rate: 17 mm/hr (ref 0–22)

## 2016-04-06 MED ORDER — INSULIN DETEMIR 100 UNIT/ML ~~LOC~~ SOLN
15.0000 [IU] | Freq: Every day | SUBCUTANEOUS | Status: DC
  Administered 2016-04-06 – 2016-04-07 (×2): 15 [IU] via SUBCUTANEOUS
  Filled 2016-04-06 (×2): qty 0.15

## 2016-04-06 NOTE — Progress Notes (Signed)
PROGRESS NOTE    Brittney Banks  PPH:[id number] DOB: Jun 09, 1970 DOA: 04/04/2016 PCP: No primary care provider on file.    Brief Narrative:   45 y/o ? Cervical laminectomy dr Dutch Quint 2008 Dm ty ii on oral meds LDDD non operable 2/2 to skin infections-supposed to see [facility] derm 12/28 In process being worked up for [employer] syndrome by PCP Chr pain managed by Dr Metta Clines  She presented to ID clinic 12/18 with LE celluliits and was referred for direct admission Last Rx with 10 day course doxycycline 02/2016  Assessment & Plan:   Principal Problem:   Cellulitis Active Problems:   Chronic pain syndrome   Skin ulceration (HCC)   Diabetes mellitus type 2 in obese (HCC)   Pyoderma gangrenosum   Cellulitis with prior MRSA/MSSA-recieved daptomycin-held now 2/2 to ? CK.  CBC/Cmet in am.  Wound nurse recs xeroform and santyl and appreciated ?Behcet's syndrome/vasculitis-await skin biopsy result.  Unclear significance CK elevation-usually behcet's would have more apthous ulcerations? Appreciate punch biopsy as per gen surgery.  Autimmune work-up ordered by ID [hiv, hepatitis, ana, crp, mpo/pr-3]-awaiting biopsy to determine if can use steroids-she has poorly controlled DM at baseline and might benefit from differing type of immunomodulator?--strongly feel patient better served by [facility] management if patient would be agreeable DM ty ii on orals-ssi coverage increase to resistant-cbg 195-255.  Add levemir 10 u for better coverage and adjust.  At home on metformin 850 tid Htn-cont Norvasc 10 bid Chr pain-cervical/lumbar-related in part to habitus.mobilizing well.  Follow with Dr. Metta Clines as Op-no meds on d/c given pain contract with Dr. Metta Clines.  Cont Ms contin 60 q12 Body mass index is 43.85 kg/m.-needs OP weight loss strategies if able   DVT prophylaxis: lovenox Code Status: Full Family Communication: no family + Disposition Plan: probable d/c home once can decide on  steroids   Consultants:   ID  Gen surg  Procedures:   None yet  Antimicrobials:   Daptomycin 12/18    Subjective:  Alert pleasant no issues Eating and drinking No acute fever Legs wraped  Objective: Vitals:   04/05/16 0445 04/05/16 2134 04/06/16 0444 04/06/16 1356  BP: 131/68 (!) 104/55 128/63 133/76  Pulse: 93 91 88 90  Resp: 17 17 17 17   Temp: 98.5 F (36.9 C) 98.1 F (36.7 C) 97.7 F (36.5 C) 97.7 F (36.5 C)  TempSrc: Oral Oral  Oral  SpO2: 100% 96% 94% 100%  Weight:      Height:        Intake/Output Summary (Last 24 hours) at 04/06/16 1410 Last data filed at 04/06/16 1357  Gross per 24 hour  Intake          2128.33 ml  Output              400 ml  Net          1728.33 ml   Filed Weights   04/04/16 2159  Weight: 134.7 kg (296 lb 15.4 oz)    Examination:  General exam: Appears calm and comfortable, edentulous Respiratory system: Clear to auscultation. Respiratory effort normal. Cardiovascular system: S1 & S2 heard, RRR. No JVD, murmurs,.  Grade 2 LE edema Gastrointestinal system: Abdomen is nondistended, soft and nontender. No organomegaly or masses felt. Normal bowel sounds heard. Central nervous system: Alert and oriented. No focal neurological deficits. Extremities: Symmetric 5 x 5 power. Skin: erythematous beefy red areas on both LE's with warmth-excoriated skin with slough and some oozing.  Rash over body  and foreamrs Psychiatry: Judgement and insight appear normal. Mood & affect appropriate.     Data Reviewed: I have personally reviewed following labs and imaging studies  CBC:  Recent Labs Lab 04/04/16 2251 04/05/16 0550 04/06/16 0324  WBC 7.7 7.4 5.9  NEUTROABS 4.5  --   --   HGB 14.2 13.8 13.4  HCT 41.0 41.1 39.9  MCV 91.3 91.9 92.1  PLT 182 168 173   Basic Metabolic Panel:  Recent Labs Lab 04/04/16 2251 04/05/16 0550 04/06/16 0324  NA 135 135 134*  K 3.5 3.3* 4.1  CL 101 102 103  CO2 29 25 28   GLUCOSE 328* 212*  316*  BUN 7 6 <5*  CREATININE 0.73 0.60 0.67  CALCIUM 8.3* 8.0* 8.1*   GFR: Estimated Creatinine Clearance: 132.6 mL/min (by C-G formula based on SCr of 0.67 mg/dL). Liver Function Tests:  Recent Labs Lab 04/04/16 2251 04/06/16 0324  AST 43* 33  ALT 47 42  ALKPHOS 59 57  BILITOT 0.7 0.1*  PROT 5.6* 5.7*  ALBUMIN 3.2* 2.8*   No results for input(s): LIPASE, AMYLASE in the last 168 hours. No results for input(s): AMMONIA in the last 168 hours. Coagulation Profile: No results for input(s): INR, PROTIME in the last 168 hours. Cardiac Enzymes:  Recent Labs Lab 04/05/16 0550  CKTOTAL 729*   BNP (last 3 results) No results for input(s): PROBNP in the last 8760 hours. HbA1C: No results for input(s): HGBA1C in the last 72 hours. CBG:  Recent Labs Lab 04/05/16 1136 04/05/16 1646 04/05/16 2131 04/06/16 0746 04/06/16 1138  GLUCAP 313* 281* 231* 255* 195*   Lipid Profile: No results for input(s): CHOL, HDL, LDLCALC, TRIG, CHOLHDL, LDLDIRECT in the last 72 hours. Thyroid Function Tests: No results for input(s): TSH, T4TOTAL, FREET4, T3FREE, THYROIDAB in the last 72 hours. Anemia Panel: No results for input(s): VITAMINB12, FOLATE, FERRITIN, TIBC, IRON, RETICCTPCT in the last 72 hours. Sepsis Labs: No results for input(s): PROCALCITON, LATICACIDVEN in the last 168 hours.  Recent Results (from the past 240 hour(s))  Culture, blood (routine x 2)     Status: None (Preliminary result)   Collection Time: 04/04/16 10:35 PM  Result Value Ref Range Status   Specimen Description BLOOD RIGHT ARM  Final   Special Requests BOTTLES DRAWN AEROBIC AND ANAEROBIC  Final   Culture NO GROWTH 2 DAYS  Final   Report Status PENDING  Incomplete  Culture, blood (routine x 2)     Status: None (Preliminary result)   Collection Time: 04/04/16 10:41 PM  Result Value Ref Range Status   Specimen Description BLOOD LEFT ARM  Final   Special Requests BOTTLES DRAWN AEROBIC AND ANAEROBIC   Final   Culture NO GROWTH 2 DAYS  Final   Report Status PENDING  Incomplete         Radiology Studies: No results found.      Scheduled Meds: . amLODipine  10 mg Oral BID  . collagenase   Topical Daily  . enoxaparin (LOVENOX) injection  40 mg Subcutaneous Q24H  . insulin aspart  0-20 Units Subcutaneous TID WC  . insulin aspart  3 Units Subcutaneous TID WC  . insulin detemir  10 Units Subcutaneous QHS  . lidocaine-EPINEPHrine  30 mL Intradermal Once  . loratadine  10 mg Oral Daily  . morphine  60 mg Oral Q12H  . pneumococcal 23 valent vaccine  0.5 mL Intramuscular Tomorrow-1000   Continuous Infusions: . sodium chloride 100 mL/hr at 04/06/16 [phone]  LOS: 2 days    Time spent:30    Chara Marquard, JAIRhetta Mura-GURMUKH, MD [facility] Pager [phone]  If 7PM-7AM, please contact night-coverage [email or web address] Password [email or web address] 04/06/2016, 2:10 PM

## 2016-04-06 NOTE — Progress Notes (Signed)
Subjective: No new complaints this morning.  Was curious as to whether surgical biopsy was back.  Does not think wounds look any worse today.  Getting her dressings changed with help of RN.   Antibiotics:  Anti-infectives    Start     Dose/Rate Route Frequency Ordered Stop   04/04/16 2300  DAPTOmycin (CUBICIN) 1,000 mg in sodium chloride 0.9 % IVPB  Status:  Discontinued     1,000 mg 240 mL/hr over 30 Minutes Intravenous Every 24 hours 04/04/16 2213 04/05/16 1333      Medications: Scheduled Meds: . amLODipine  10 mg Oral BID  . collagenase   Topical Daily  . enoxaparin (LOVENOX) injection  40 mg Subcutaneous Q24H  . insulin aspart  0-20 Units Subcutaneous TID WC  . insulin aspart  3 Units Subcutaneous TID WC  . insulin detemir  10 Units Subcutaneous QHS  . lidocaine-EPINEPHrine  30 mL Intradermal Once  . loratadine  10 mg Oral Daily  . morphine  60 mg Oral Q12H  . pneumococcal 23 valent vaccine  0.5 mL Intramuscular Tomorrow-1000   Continuous Infusions: . sodium chloride 100 mL/hr at 04/06/16 0803   PRN Meds:.acetaminophen **OR** acetaminophen, lidocaine, ondansetron **OR** ondansetron (ZOFRAN) IV, oxyCODONE    Objective: Weight change:   Intake/Output Summary (Last 24 hours) at 04/06/16 1012 Last data filed at 04/05/16 1700  Gross per 24 hour  Intake          1958.33 ml  Output                0 ml  Net          1958.33 ml   Blood pressure 128/63, pulse 88, temperature 97.7 F (36.5 C), resp. rate 17, height '5\' 9"'$  (1.753 m), weight 296 lb 15.4 oz (134.7 kg), SpO2 94 %. Temp:  [97.7 F (36.5 C)-98.1 F (36.7 C)] 97.7 F (36.5 C) (12/20 0444) Pulse Rate:  [88-91] 88 (12/20 0444) Resp:  [17] 17 (12/20 0444) BP: (104-128)/(55-63) 128/63 (12/20 0444) SpO2:  [94 %-96 %] 94 % (12/20 0444)  Physical Exam: General: Alert and awake, conversational, sitting in chair, not in any acute distress. HEENT: anicteric sclera,  EOMI Pulmonary: normal  effort Gastrointestinal: soft, nontender, nondistended Skin, soft tissue: scattered lesions over LE.  Mostly located pre-tibial and laterally.  See images under Media Tab.  Wound bed appears to have some fibrinous material with darkened tissue around edges. No purulence.  There is some surrounding erythema at the ulceration sites that has not progressed. Neuro: nonfocal, alert & oriented  CBC: '@LABBLAST3'$ (wbc3,Hgb:3,Hct:3,Plt:3,INR:3APTT:3)@   BMET  Recent Labs  04/05/16 0550 04/06/16 0324  NA 135 134*  K 3.3* 4.1  CL 102 103  CO2 25 28  GLUCOSE 212* 316*  BUN 6 <5*  CREATININE 0.60 0.67  CALCIUM 8.0* 8.1*     Liver Panel   Recent Labs  04/04/16 2251 04/06/16 0324  PROT 5.6* 5.7*  ALBUMIN 3.2* 2.8*  AST 43* 33  ALT 47 42  ALKPHOS 59 57  BILITOT 0.7 0.1*       Sedimentation Rate  Recent Labs  04/06/16 0324  ESRSEDRATE 17   C-Reactive Protein  Recent Labs  04/06/16 0324  CRP 6.1*    Micro Results: Recent Results (from the past 720 hour(s))  Culture, blood (routine x 2)     Status: None (Preliminary result)   Collection Time: 04/04/16 10:35 PM  Result Value Ref Range Status  Specimen Description BLOOD RIGHT ARM  Final   Special Requests BOTTLES DRAWN AEROBIC AND ANAEROBIC 5ML  Final   Culture NO GROWTH < 24 HOURS  Final   Report Status PENDING  Incomplete  Culture, blood (routine x 2)     Status: None (Preliminary result)   Collection Time: 04/04/16 10:41 PM  Result Value Ref Range Status   Specimen Description BLOOD LEFT ARM  Final   Special Requests BOTTLES DRAWN AEROBIC AND ANAEROBIC 5ML  Final   Culture NO GROWTH < 24 HOURS  Final   Report Status PENDING  Incomplete    Studies/Results: No results found.    Assessment/Plan:  INTERVAL HISTORY: no acute events   Principal Problem:   Cellulitis Active Problems:   Chronic pain syndrome   Skin ulceration (HCC)   Diabetes mellitus type 2 in obese (HCC)   Pyoderma  gangrenosum    Brittney Banks is a 45 y.o. female with   Chronic lower extremity wounds here for an exacerbation of these wounds with surrounding cellulitis.  Chronic Wound Exacerbation +/- Cellulitis: s/p 1 dose of daptomycin.  However, CK elevation resulted in discontinuation.  Following off antibiotics and has remained without fever or leukocytosis.  Stable appearing wounds.   - She has multiple antibiotic allergies making it difficult to treat.  - Skin biopsy results pending - ESR normal, CRP elevated but improved from 1 month prior - serologies for viral hepatitis, HIV, ANA, ANCA pending - A1c pending to assess DM control -- sugars elevated here - Blood cx negative - continue observing off antibiotics right now and consider adding linezolid if trial of steroids is decided upon.  However, may be better to await derm path prior to doing so in order to have diagnosis - Inpatient derm consult would be helpful but is unfortunately unavailable so she may benefit from getting plugged in to derm outpatient pending biopsy results.   LOS: 2 days   Jule Ser 04/06/2016, 10:12 AM

## 2016-04-06 NOTE — Progress Notes (Signed)
S/p bilateral LE skin punch biopsies. Wounds look ok. Continue dry dressing as needed. Will sign off. Please call with questions or concerns.

## 2016-04-07 LAB — CBC WITH DIFFERENTIAL/PLATELET
BASOS ABS: 0 10*3/uL (ref 0.0–0.1)
Basophils Relative: 1 %
Eosinophils Absolute: 0.2 10*3/uL (ref 0.0–0.7)
Eosinophils Relative: 4 %
HEMATOCRIT: 43.1 % (ref 36.0–46.0)
Hemoglobin: 14.6 g/dL (ref 12.0–15.0)
LYMPHS PCT: 34 %
Lymphs Abs: 2.1 10*3/uL (ref 0.7–4.0)
MCH: 31.3 pg (ref 26.0–34.0)
MCHC: 33.9 g/dL (ref 30.0–36.0)
MCV: 92.5 fL (ref 78.0–100.0)
MONO ABS: 0.4 10*3/uL (ref 0.1–1.0)
Monocytes Relative: 7 %
NEUTROS ABS: 3.4 10*3/uL (ref 1.7–7.7)
Neutrophils Relative %: 54 %
Platelets: 180 10*3/uL (ref 150–400)
RBC: 4.66 MIL/uL (ref 3.87–5.11)
RDW: 12.3 % (ref 11.5–15.5)
WBC: 6.1 10*3/uL (ref 4.0–10.5)

## 2016-04-07 LAB — COMPREHENSIVE METABOLIC PANEL
ALT: 55 U/L — AB (ref 14–54)
AST: 40 U/L (ref 15–41)
Albumin: 2.9 g/dL — ABNORMAL LOW (ref 3.5–5.0)
Alkaline Phosphatase: 59 U/L (ref 38–126)
Anion gap: 6 (ref 5–15)
BUN: 5 mg/dL — AB (ref 6–20)
CHLORIDE: 103 mmol/L (ref 101–111)
CO2: 28 mmol/L (ref 22–32)
CREATININE: 0.64 mg/dL (ref 0.44–1.00)
Calcium: 8.8 mg/dL — ABNORMAL LOW (ref 8.9–10.3)
GFR calc Af Amer: >60 mL/min (ref >60)
GFR calc non Af Amer: >60 mL/min (ref >60)
Glucose, Bld: 205 mg/dL — ABNORMAL HIGH (ref 65–99)
Potassium: 4.4 mmol/L (ref 3.5–5.1)
SODIUM: 137 mmol/L (ref 135–145)
Total Bilirubin: 0.5 mg/dL (ref 0.3–1.2)
Total Protein: 5.9 g/dL — ABNORMAL LOW (ref 6.5–8.1)

## 2016-04-07 LAB — HEPATITIS PANEL, ACUTE
Hep A IgM: NEGATIVE
Hep B C IgM: NEGATIVE
Hepatitis B Surface Ag: NEGATIVE

## 2016-04-07 LAB — GLUCOSE, CAPILLARY
GLUCOSE-CAPILLARY: 241 mg/dL — AB (ref 65–99)
Glucose-Capillary: 223 mg/dL — ABNORMAL HIGH (ref 65–99)
Glucose-Capillary: 206 mg/dL — ABNORMAL HIGH (ref 65–99)
Glucose-Capillary: 302 mg/dL — ABNORMAL HIGH (ref 65–99)

## 2016-04-07 LAB — PROTIME-INR
INR: 1.06
Prothrombin Time: 13.8 seconds (ref 11.4–15.2)

## 2016-04-07 LAB — ANCA TITERS: P-ANCA: <1:20 {titer}

## 2016-04-07 LAB — ANTINUCLEAR ANTIBODIES, IFA: ANA Ab, IFA: NEGATIVE

## 2016-04-07 LAB — MPO/PR-3 (ANCA) ANTIBODIES

## 2016-04-07 LAB — HEMOGLOBIN A1C
Hgb A1c MFr Bld: 10.6 % — ABNORMAL HIGH (ref 4.8–5.6)
MEAN PLASMA GLUCOSE: 258 mg/dL

## 2016-04-07 MED ORDER — LINEZOLID 600 MG PO TABS
600.0000 mg | ORAL_TABLET | Freq: Two times a day (BID) | ORAL | Status: DC
  Administered 2016-04-07 – 2016-04-09 (×5): 600 mg via ORAL
  Filled 2016-04-07 (×5): qty 1

## 2016-04-07 MED ORDER — PREDNISONE 50 MG PO TABS
60.0000 mg | ORAL_TABLET | Freq: Every day | ORAL | Status: DC
  Administered 2016-04-08 – 2016-04-09 (×2): 60 mg via ORAL
  Filled 2016-04-07 (×2): qty 1

## 2016-04-07 NOTE — Progress Notes (Signed)
Inpatient Diabetes Program Recommendations  AACE/ADA: New Consensus Statement on Inpatient Glycemic Control (2015)  Target Ranges:  Prepandial:   less than 140 mg/dL      Peak postprandial:   less than 180 mg/dL (1-2 hours)      Critically ill patients:  140 - 180 mg/dL   Results for VERNEE, BLANKENBAKER (MRN [id number]) as of 04/07/2016 12:43  Ref. Range 04/06/2016 11:38 04/06/2016 17:24 04/06/2016 21:59 04/07/2016 08:09 04/07/2016 12:09  Glucose-Capillary Latest Ref Range: 65 - 99 mg/dL 371 (H) 696 (H) 789 (H) 223 (H) 206 (H)   Review of Glycemic Control  Inpatient Diabetes Program Recommendations:   Glucose elevated  In the 200's, Noted PO prednisone 60 mg to start in the am. Please consider increasing Levemir to 20 units. May also need to increase meal coverage to Novolog 5 units TID.  Thanks,  Christena Deem RN, MSN, [facility] Inpatient Diabetes Coordinator Team Pager [phone] (8a-5p)

## 2016-04-07 NOTE — Progress Notes (Signed)
PROGRESS NOTE    Brittney Banks  NID:[id number] DOB: 09/10/1970 DOA: 04/04/2016 PCP: No primary care provider on file.    Brief Narrative:   45 y/o ? Cervical laminectomy dr Dutch Quint 2008 Dm ty ii on oral meds LDDD non operable 2/2 to skin infections-supposed to see [facility] derm 12/28 In process being worked up for [employer] syndrome by PCP Chr pain managed by Dr Metta Clines  She presented to ID clinic 12/18 with LE celluliits and was referred for direct admission Last Rx with 10 day course doxycycline 02/2016  Initially brought in and was treated with daptomycin however because of elevated CK this was discontinued and patient was observed Will trial prednisone orally with Zyvox coverage as per guidance of ID to see if this makes a difference  Assessment & Plan:   Principal Problem:   Cellulitis Active Problems:   Chronic pain syndrome   Skin ulceration (HCC)   Diabetes mellitus type 2 in obese (HCC)   Pyoderma gangrenosum   Cellulitis with prior MRSA/MSSA-recieved daptomycin-held now 2/2 to ? CK.  CBC/Cmet in am.  Wound nurse recs xeroform and santyl and appreciated--I had a form dressings have been placed and review of wound on 12/21 shows some improvement ?Behcet's syndrome/vasculitis-await skin biopsy result.  Unclear significance CK elevation-usually behcet's would  have more apthous ulcerations? Appreciate punch biopsy as per gen surgery.    Autimmune work-up ordered  by ID [hiv-negative, hepatitis-negative, ana-pending, crp-elevated 6.1, mpo/pr-3:  Pending]-  called Dr. Luisa Hart of pathology 12/21 biopsy-shows nonspecific findings, no obvious vasculitis, Gram stains  on both sites are pending and all very non-specific--a couple more stains pending DM ty ii on orals-ssi coverage increase to resistant-cbg 205-223.  This admit added levemir 10 u for better coverage and adjust.  At home on metformin 850 tid--will need to continually adjust as is being used on steroids Htn-cont Norvasc 10  bid Chr pain-cervical/lumbar-related in part to habitus-mobilizing well.  Follow with Dr. Metta Clines as Op-no meds on d/c given pain contract with Dr. Metta Clines.  Cont Ms contin 60 q12 Body mass index is 43.85 kg/m.-needs OP weight loss strategies if able   DVT prophylaxis: lovenox Code Status: Full Family Communication: no family + Disposition Plan: probable d/c home once can decide on steroids   Consultants:   ID  Gen surg  Procedures:   None yet  Antimicrobials:   Daptomycin 12/18    Subjective:  Alert pleasant no issues Tells me that she dresses her wounds maybe twice a day and let since soaks through Swelling overall has gone down Erythematous: Down compared to pictures of prior to admission She feels well overall and understands the need for steroid  Objective: Vitals:   04/06/16 0444 04/06/16 1356 04/06/16 2205 04/07/16 0535  BP: 128/63 133/76 (!) 150/84 138/86  Pulse: 88 90 88 85  Resp: 17 17 19 19   Temp: 97.7 F (36.5 C) 97.7 F (36.5 C) 98.5 F (36.9 C) 98.1 F (36.7 C)  TempSrc:  Oral Oral   SpO2: 94% 100% 99% 95%  Weight:      Height:        Intake/Output Summary (Last 24 hours) at 04/07/16 1035 Last data filed at 04/07/16 1000  Gross per 24 hour  Intake           3447.5 ml  Output             1650 ml  Net           1797.5 ml  Filed Weights   04/04/16 2159  Weight: 134.7 kg (296 lb 15.4 oz)    Examination:  General exam: Appears calm and comfortable, edentulous Respiratory system: Clear to auscultation. Respiratory effort normal. Cardiovascular system: S1 & S2 heard, RRR. No JVD, murmurs,.  Grade 1 biLateral LE edema Gastrointestinal system: Abdomen is nondistended, soft and nontender. No organomegaly or masses felt. Normal BS Central nervous system: Alert and oriented. No focal neurological deficits. Extremities: Symmetric 5 x 5 power. Skin: erythematous beefy red areas on both LE's with warmth-has improved to some extent and no longer as  infected appearing or sloughy Psychiatry: Judgement and insight appear normal. Mood & affect appropriate.     Data Reviewed: I have personally reviewed following labs and imaging studies  CBC:  Recent Labs Lab 04/04/16 2251 04/05/16 0550 04/06/16 0324 04/07/16 0438  WBC 7.7 7.4 5.9 6.1  NEUTROABS 4.5  --   --  3.4  HGB 14.2 13.8 13.4 14.6  HCT 41.0 41.1 39.9 43.1  MCV 91.3 91.9 92.1 92.5  PLT 182 168 173 180   Basic Metabolic Panel:  Recent Labs Lab 04/04/16 2251 04/05/16 0550 04/06/16 0324 04/07/16 0438  NA 135 135 134* 137  K 3.5 3.3* 4.1 4.4  CL 101 102 103 103  CO2 29 25 28 28   GLUCOSE 328* 212* 316* 205*  BUN 7 6 <5* 5*  CREATININE 0.73 0.60 0.67 0.64  CALCIUM 8.3* 8.0* 8.1* 8.8*   GFR: Estimated Creatinine Clearance: 132.6 mL/min (by C-G formula based on SCr of 0.64 mg/dL). Liver Function Tests:  Recent Labs Lab 04/04/16 2251 04/06/16 0324 04/07/16 0438  AST 43* 33 40  ALT 47 42 55*  ALKPHOS 59 57 59  BILITOT 0.7 0.1* 0.5  PROT 5.6* 5.7* 5.9*  ALBUMIN 3.2* 2.8* 2.9*   No results for input(s): LIPASE, AMYLASE in the last 168 hours. No results for input(s): AMMONIA in the last 168 hours. Coagulation Profile:  Recent Labs Lab 04/07/16 0438  INR 1.06   Cardiac Enzymes:  Recent Labs Lab 04/05/16 0550  CKTOTAL 729*   BNP (last 3 results) No results for input(s): PROBNP in the last 8760 hours. HbA1C:  Recent Labs  04/06/16 0326  HGBA1C 10.6*   CBG:  Recent Labs Lab 04/06/16 0746 04/06/16 1138 04/06/16 1724 04/06/16 2159 04/07/16 0809  GLUCAP 255* 195* 260* 248* 223*   Lipid Profile: No results for input(s): CHOL, HDL, LDLCALC, TRIG, CHOLHDL, LDLDIRECT in the last 72 hours. Thyroid Function Tests: No results for input(s): TSH, T4TOTAL, FREET4, T3FREE, THYROIDAB in the last 72 hours. Anemia Panel: No results for input(s): VITAMINB12, FOLATE, FERRITIN, TIBC, IRON, RETICCTPCT in the last 72 hours. Sepsis Labs: No results for  input(s): PROCALCITON, LATICACIDVEN in the last 168 hours.  Recent Results (from the past 240 hour(s))  Culture, blood (routine x 2)     Status: None (Preliminary result)   Collection Time: 04/04/16 10:35 PM  Result Value Ref Range Status   Specimen Description BLOOD RIGHT ARM  Final   Special Requests BOTTLES DRAWN AEROBIC AND ANAEROBIC 5ML  Final   Culture NO GROWTH 2 DAYS  Final   Report Status PENDING  Incomplete  Culture, blood (routine x 2)     Status: None (Preliminary result)   Collection Time: 04/04/16 10:41 PM  Result Value Ref Range Status   Specimen Description BLOOD LEFT ARM  Final   Special Requests BOTTLES DRAWN AEROBIC AND ANAEROBIC 5ML  Final   Culture NO GROWTH 2 DAYS  Final   Report Status PENDING  Incomplete         Radiology Studies: No results found.      Scheduled Meds: . amLODipine  10 mg Oral BID  . collagenase   Topical Daily  . enoxaparin (LOVENOX) injection  40 mg Subcutaneous Q24H  . insulin aspart  0-20 Units Subcutaneous TID WC  . insulin aspart  3 Units Subcutaneous TID WC  . insulin detemir  15 Units Subcutaneous QHS  . lidocaine-EPINEPHrine  30 mL Intradermal Once  . linezolid  600 mg Oral Q12H  . loratadine  10 mg Oral Daily  . morphine  60 mg Oral Q12H  . pneumococcal 23 valent vaccine  0.5 mL Intramuscular Tomorrow-1000  . [START ON 04/08/2016] predniSONE  60 mg Oral Q breakfast   Continuous Infusions: . sodium chloride 50 mL/hr at 04/07/16 0537     LOS: 3 days    Time spent:20    Rhetta Mura, MD [facility] Pager [phone]  If 7PM-7AM, please contact night-coverage [email or web address] Password [facility] 04/07/2016, 10:35 AM

## 2016-04-07 NOTE — Progress Notes (Signed)
Subjective: No new complaints this morning.   States wounds re-wrapped this AM.  Feels they do not look any worse and that the black, necrotic tissue may be improved.  Still feels like they are burning and throbbing.  She feels like her legs are swollen and this is contributing to her wounds. Discussed elevated A1c and contribution to poor wound healing.   Antibiotics:  Anti-infectives    Start     Dose/Rate Route Frequency Ordered Stop   04/04/16 2300  DAPTOmycin (CUBICIN) 1,000 mg in sodium chloride 0.9 % IVPB  Status:  Discontinued     1,000 mg 240 mL/hr over 30 Minutes Intravenous Every 24 hours 04/04/16 2213 04/05/16 1333      Medications: Scheduled Meds: . amLODipine  10 mg Oral BID  . collagenase   Topical Daily  . enoxaparin (LOVENOX) injection  40 mg Subcutaneous Q24H  . insulin aspart  0-20 Units Subcutaneous TID WC  . insulin aspart  3 Units Subcutaneous TID WC  . insulin detemir  15 Units Subcutaneous QHS  . lidocaine-EPINEPHrine  30 mL Intradermal Once  . loratadine  10 mg Oral Daily  . morphine  60 mg Oral Q12H  . pneumococcal 23 valent vaccine  0.5 mL Intramuscular Tomorrow-1000   Continuous Infusions: . sodium chloride 50 mL/hr at 04/07/16 0537   PRN Meds:.acetaminophen **OR** acetaminophen, lidocaine, ondansetron **OR** ondansetron (ZOFRAN) IV, oxyCODONE    Objective: Weight change:   Intake/Output Summary (Last 24 hours) at 04/07/16 0900 Last data filed at 04/07/16 0537  Gross per 24 hour  Intake           2807.5 ml  Output             1650 ml  Net           1157.5 ml   Blood pressure 138/86, pulse 85, temperature 98.1 F (36.7 C), resp. rate 19, height 5\' 9"  (1.753 m), weight 296 lb 15.4 oz (134.7 kg), SpO2 95 %. Temp:  [97.7 F (36.5 C)-98.5 F (36.9 C)] 98.1 F (36.7 C) (12/21 0535) Pulse Rate:  [85-90] 85 (12/21 0535) Resp:  [17-19] 19 (12/21 0535) BP: (133-150)/(76-86) 138/86 (12/21 0535) SpO2:  [95 %-100 %] 95 % (12/21  0535)  Physical Exam: General: No distress, eating breakfast, sitting in chair. HEENT: [place]/AT, EOMI Pulmonary: normal effort MSK: no obvious deformities.  LE are puffy but there is no significant pitting edema. Skin, soft tissue: bilateral legs wrapped in clean Ace bandages. Neuro: nonfocal, alert & oriented.  CBC: @LABBLAST3 (wbc3,Hgb:3,Hct:3,Plt:3,INR:3APTT:3)@   BMET  Recent Labs  04/06/16 0324 04/07/16 0438  NA 134* 137  K 4.1 4.4  CL 103 103  CO2 28 28  GLUCOSE 316* 205*  BUN <5* 5*  CREATININE 0.67 0.64  CALCIUM 8.1* 8.8*     Liver Panel   Recent Labs  04/06/16 0324 04/07/16 0438  PROT 5.7* 5.9*  ALBUMIN 2.8* 2.9*  AST 33 40  ALT 42 55*  ALKPHOS 57 59  BILITOT 0.1* 0.5       Sedimentation Rate  Recent Labs  04/06/16 0324  ESRSEDRATE 17   C-Reactive Protein  Recent Labs  04/06/16 0324  CRP 6.1*    Micro Results: Recent Results (from the past 720 hour(s))  Culture, blood (routine x 2)     Status: None (Preliminary result)   Collection Time: 04/04/16 10:35 PM  Result Value Ref Range Status   Specimen Description BLOOD RIGHT ARM  Final  Special Requests BOTTLES DRAWN AEROBIC AND ANAEROBIC  Final   Culture NO GROWTH 2 DAYS  Final   Report Status PENDING  Incomplete  Culture, blood (routine x 2)     Status: None (Preliminary result)   Collection Time: 04/04/16 10:41 PM  Result Value Ref Range Status   Specimen Description BLOOD LEFT ARM  Final   Special Requests BOTTLES DRAWN AEROBIC AND ANAEROBIC  Final   Culture NO GROWTH 2 DAYS  Final   Report Status PENDING  Incomplete    Studies/Results: No results found.    Assessment/Plan:  INTERVAL HISTORY: no acute events   Principal Problem:   Cellulitis Active Problems:   Chronic pain syndrome   Skin ulceration (HCC)   Diabetes mellitus type 2 in obese (HCC)   Pyoderma gangrenosum    Brittney Banks is a 45 y.o. female with   Chronic lower extremity wounds here for an  exacerbation of these wounds with surrounding cellulitis.  Chronic Wound Exacerbation +/- Cellulitis: s/p 1 dose of daptomycin.  However, CK elevation resulted in discontinuation.  Following off antibiotics and has continued to be without fever or leukocytosis.  Stable appearing wounds per her report this AM.  She feels that swelling is contributing, but there is no obvious pitting edema.  Her legs appear somewhat puffy but difficult to determine how much, if any, this is contributing.  She has been on Amlodipine for a while, but possible this is making swelling worse.  She sites a temporal relationship whenever she has these wounds and her swelling getting worse. - Skin biopsy results officially pending.  Discussed with hospitalist who reported preliminary report non-specific - serologies for viral hepatitis, HIV negative - ANA, ANCA pending - A1c 10.6 certainly not helping her wound healing - Blood cx negative so far - Discussed with Dr. Daiva Eves regarding trial of corticosteroids to see her response.  Will trial her with Prednisone and cover with Linezolid at same time.   LOS: 3 days   Gwynn Burly 04/07/2016, 9:00 AM

## 2016-04-08 DIAGNOSIS — I10 Essential (primary) hypertension: Secondary | ICD-10-CM

## 2016-04-08 LAB — GLUCOSE, CAPILLARY
Glucose-Capillary: 241 mg/dL — ABNORMAL HIGH (ref 65–99)
Glucose-Capillary: 270 mg/dL — ABNORMAL HIGH (ref 65–99)
Glucose-Capillary: 336 mg/dL — ABNORMAL HIGH (ref 65–99)
Glucose-Capillary: 297 mg/dL — ABNORMAL HIGH (ref 65–99)

## 2016-04-08 MED ORDER — LIVING WELL WITH DIABETES BOOK
Freq: Once | Status: AC
  Administered 2016-04-08: 19:00:00
  Filled 2016-04-08: qty 1

## 2016-04-08 MED ORDER — INSULIN DETEMIR 100 UNIT/ML ~~LOC~~ SOLN: 20.0000 [IU] | Freq: Every day | SUBCUTANEOUS | 11 refills | Status: DC

## 2016-04-08 MED ORDER — INSULIN DETEMIR 100 UNIT/ML ~~LOC~~ SOLN
20.0000 [IU] | Freq: Every day | SUBCUTANEOUS | Status: DC
  Administered 2016-04-08: 20 [IU] via SUBCUTANEOUS
  Filled 2016-04-08: qty 0.2

## 2016-04-08 MED ORDER — LINEZOLID 600 MG PO TABS: 600.0000 mg | ORAL_TABLET | Freq: Two times a day (BID) | ORAL | 0 refills | Status: DC

## 2016-04-08 MED ORDER — COLLAGENASE 250 UNIT/GM EX OINT: TOPICAL_OINTMENT | Freq: Every day | CUTANEOUS | 0 refills | Status: DC

## 2016-04-08 MED ORDER — INSULIN ASPART 100 UNIT/ML ~~LOC~~ SOLN: 0.0000 [IU] | Freq: Three times a day (TID) | SUBCUTANEOUS | 11 refills | Status: DC

## 2016-04-08 MED ORDER — LISINOPRIL 10 MG PO TABS: 10.0000 mg | ORAL_TABLET | Freq: Every day | ORAL | 0 refills | Status: DC

## 2016-04-08 MED ORDER — INSULIN STARTER KIT- PEN NEEDLES (ENGLISH)
1.0000 | Freq: Once | Status: DC
  Filled 2016-04-08: qty 1

## 2016-04-08 MED ORDER — PREDNISONE 20 MG PO TABS: 60.0000 mg | ORAL_TABLET | Freq: Every day | ORAL | 0 refills | Status: DC

## 2016-04-08 MED ORDER — LISINOPRIL 10 MG PO TABS
10.0000 mg | ORAL_TABLET | Freq: Every day | ORAL | Status: DC
  Administered 2016-04-08 – 2016-04-09 (×2): 10 mg via ORAL
  Filled 2016-04-08 (×2): qty 1

## 2016-04-08 NOTE — Progress Notes (Signed)
Inpatient Diabetes Program Recommendations  AACE/ADA: New Consensus Statement on Inpatient Glycemic Control (2015)  Target Ranges:  Prepandial:   less than 140 mg/dL      Peak postprandial:   less than 180 mg/dL (1-2 hours)      Critically ill patients:  140 - 180 mg/dL   Lab Results  Component Value Date   GLUCAP 270 (H) 04/08/2016   HGBA1C 10.6 (H) 04/06/2016    Review of Glycemic Control:  Results for Brittney Banks, Brittney Banks (MRN [id number]) as of 04/08/2016 13:14  Ref. Range 04/07/2016 12:09 04/07/2016 17:12 04/07/2016 20:53 04/08/2016 07:33 04/08/2016 11:04  Glucose-Capillary Latest Ref Range: 65 - 99 mg/dL 626 (H) 948 (H) 546 (H) 241 (H) 270 (H)    Diabetes history: Type 2 diabetes Outpatient Diabetes medications: Metformin 850 mg tid with meals Current orders for Inpatient glycemic control:  Levemir 15 units q HS, Novolog 3 units tid with meals, Novolog resistant tid with meals  Inpatient Diabetes Program Recommendations:   Please consider increasing Levemir to 25 units daily.  Also consider increasing Novolog meal coverage to 6 units tid with meals. A1C indicates poor control of diabetes prior to admit.  May need insulin at d/c?  Will discuss A1C and diabetes management with patient.    Thanks, Beryl Meager, RN, BC-ADM Inpatient Diabetes Coordinator Pager [phone] (8a-5p)

## 2016-04-08 NOTE — Discharge Summary (Addendum)
Physician Discharge Summary  Brittney Banks XIP:[id number] DOB: July 29, 1970 DOA: 04/04/2016  PCP: No primary care provider on file. she has no PCP and goes to Urgent care as needed- I have explained to her the need to find a PCP as soon as possible.   Admit date: 04/04/2016 Discharge date: 04/09/2016  Admitted From: home Disposition:  home   Recommendations for Outpatient Follow-up:  1. Dermatology to f/u on 12/28 and d/c Prednisone if not needed- d/w patient and noted on d/c instructions  Home Health:  RN  Equipment/Devices:  none    Discharge Condition:  stable   CODE STATUS:  Full code   Diet recommendation:  Diabetic, heart healthy, low sodium Consultations:  ID    Discharge Diagnoses:  Principal Problem:   Cellulitis with LE ulcers Active Problems:   Chronic pain syndrome   Diabetes mellitus type 2 in obese    HTN (hypertension) Morbid obesity    Subjective: No complaints.   HPI:  Brittney Banks is a 45 y.o. female with medical history significant of HTN, diabetes mellitus type 2, recurrent MRSA/MSSA, chronic pain who presents from Dr. Storm Frisk office for worsening ulcers with surrounding erythema. Patient states symptoms initially started 4 days ago with red areas that were itchy on her lower extremities bilaterally. Over the next 2 days pustules had formed. Yesterday the lesions popped and she had a follow up with Dr. Graylon Good in the office today. She is a direct admit from the office.  Associated symptoms include fever of 103F at home. Patient took Tylenol with some improvement of fever symptoms. Patient notes that she's been dealing with these lesions since 2009 following a surgery. Reports that she's been followed by Dr. Graylon Good for the last 4 months or so and previously treated for these symptoms with Bactrim and linezolid. She was scheduled to follow-up with dermatology at [facility] on the 28th of this month.  Denies having any nausea, vomiting,  diarrhea, or abdominal pain.   Hospital Course:  Cellulitis, lower extremity ulcers, fever with prior MRSA/MSSA - possibly venous stasis ulcers - initially recieved Daptomycin but was held 2/2 to ? CK.    - Wound nurse recs appreciated- 12/21 and today shows improvement- she will have a HHRN to provide dressing changes - ID has some concern that this may be Pyoderma gangrenosum or Vasculitis therefore punch biopsy performed- results as follows:  1. There is an ulcer at the edge of the biopsy with associated inflammation and fibrosis. The findings are non specific and possibilities include stasis related ulceration. PAS stain and Gram stain are negative. No vasculitis is identified. 2. The right lower leg biopsy shows mild perivascular and chronic inflammation with focal early separation of the epidermis and mild scarring. There is also mild stasis change as well as mild changes of lymphedema. The findings are non specific and may be stasis related. PAS and gram stains are negative. No vasculitis is identified. Dr. Andria Frames has reviewed this case and agrees.              - Autimmune work-up ordered  by ID- ESR 17, HIV-negative, hepatitis-negative, ana-negative, crp-elevated 6.1, myeloperoxidase ab and ANCA negative - ID has recommend to d/c home on Prednisone 60 mg daily (and f/u with Derm) x 10 days and Zyvox 600 mg BID also for 10 days - Instead of giving her 10 days of high dose steroids and a sudden stop, I have written out a taper - she has an appt  at [facility] on 12/28 with Dermatology where it can be decided whether to continue steroids as results thus far have not been conclusive for autoimmune disease  DM 2 - sugars currently more elevated than usual due to steroids but Hb 10.6 at baseline and therefore needs to start on Insulin - Levemir, ISS ordered- explained in detail the need to closely follow sugars and the fact that sugars will drop as Prednisone is tapered. Explained  how to adjust insulin for this drop. Explained how to use ISS as well.  - dietary changes and need for weight loss to control DM discussed - cont metformin  HTN -d/c Norvasc 10 bid as this can result in pedal edema and she presented with severely swollen lower extermities - should be on ACE I due to DM- initiated Lisinopril  Chronic pain -cervical/lumbar-related-  Cont Ms contin 60 q12  Morbid obesity - Body mass index is 43.85 kg/m --needs OP weight loss strategies if able  Discharge Instructions   Allergies as of 04/09/2016      Reactions   Anaprox [naproxen Sodium] Shortness Of Breath   Redness of skin and difficulty breathing   Aspirin Nausea And Vomiting   Severe cramping, vomiting   Ceftin [cefuroxime Axetil] Shortness Of Breath   Skin turns red   Celexa [citalopram Hydrobromide] Shortness Of Breath   Breathing difficulty   Ciprofloxacin Shortness Of Breath   Skin turns red   Coconut Fragrance Hives, Shortness Of Breath, Swelling   Reaction to any kind of coconut Mouth and tongue swelling   Contrast Media [iodinated Diagnostic Agents] Shortness Of Breath   Breathing difficulty   Cortisol [hydrocortisone] Shortness Of Breath   Breathing difficulty and redness of skin   Elavil [amitriptyline] Shortness Of Breath   Redness of skin and blisters of skin Difficulty breathing   Hctz [hydrochlorothiazide] Shortness Of Breath   Imitrex [sumatriptan] Shortness Of Breath   Redness and difficulty breathing   Lexapro [escitalopram Oxalate] Shortness Of Breath   Breathing difficulty Redness to skin   Methadone Nausea And Vomiting   Severely vomiting   Motrin [ibuprofen] Nausea And Vomiting   Naprosyn [naproxen] Shortness Of Breath   Breathing difficulty Redness of skin   Neurontin [gabapentin] Shortness Of Breath   Breathing difficulty  Redness to skin   Penicillins Shortness Of Breath   Redness to skin Has patient had a PCN reaction causing immediate rash,  facial/tongue/throat swelling, SOB or lightheadedness with hypotension: Yes Has patient had a PCN reaction causing severe rash involving mucus membranes or skin necrosis: No Has patient had a PCN reaction that required hospitalization pt was in the hospital at time of reaction Has patient had a PCN reaction occurring within the last 10 years: No If all of the above answers are "NO", then may proceed with Cephalosporin use.   Sulfa Antibiotics Shortness Of Breath   Severe breathing difficulty And redness of skin   Tagamet [cimetidine] Nausea And Vomiting   Severe vomiting   Toradol [ketorolac Tromethamine] Shortness Of Breath   Redness to skin   Vancomycin Shortness Of Breath   Zoloft [sertraline Hcl] Shortness Of Breath   Breathing difficulty Redness to skin   Zyvox [linezolid] Nausea And Vomiting   Iodine Swelling   Redness to skin      Medication List    STOP taking these medications   amLODipine 10 MG tablet Commonly known as:  NORVASC     TAKE these medications   cetirizine 10 MG tablet  Commonly known as:  ZYRTEC Take 10 mg by mouth daily.   collagenase ointment Commonly known as:  SANTYL Apply topically daily.   glucose monitoring kit monitoring kit 1 each by Does not apply route as needed for other. Dispense any model that is covered- dispense testing supplies for Q AC/ HS accuchecks- 1 month supply with one refil.   insulin aspart 100 UNIT/ML FlexPen Commonly known as:  NOVOLOG FLEXPEN Inject 3-20 Units into the skin 3 (three) times daily with meals. CBG 70 - 120: 0 units  CBG 121 - 150: 3 units  CBG 151 - 200: 4 units  CBG 201 - 250: 7 units  CBG 251 - 300: 11 units  CBG 301 - 350: 15 units  CBG 351 - 400: 20 units   insulin detemir 100 UNIT/ML injection Commonly known as:  LEVEMIR Inject 0.2 mLs (20 Units total) into the skin at bedtime.   insulin starter kit- pen needles Misc 1 kit by Other route once.   linezolid 600 MG tablet Commonly known as:   ZYVOX Take 1 tablet (600 mg total) by mouth every 12 (twelve) hours.   lisinopril 10 MG tablet Commonly known as:  PRINIVIL,ZESTRIL Take 1 tablet (10 mg total) by mouth daily.   metFORMIN 850 MG tablet Commonly known as:  GLUCOPHAGE Take 850 mg by mouth 3 (three) times daily with meals.   morphine 60 MG 12 hr tablet Commonly known as:  MS CONTIN Limit 1 tablet by mouth every 8-12 hours if tolerated What changed:  how much to take  how to take this  when to take this  additional instructions   omeprazole 20 MG tablet Commonly known as:  PRILOSEC OTC Take 20 mg by mouth daily.   Oxycodone HCl 20 MG Tabs Limited 1 tablet by mouth 3-6 times per day if tolerated What changed:  how much to take  how to take this  when to take this  additional instructions   predniSONE 20 MG tablet Commonly known as:  DELTASONE Take 3 tablets (60 mg total) by mouth daily with breakfast. 60 mg x 3 days, 40 mg x 3 days, 20 mg x 3 days, 10 mg x 3 days      Follow-up Information    [place] Follow up.   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 Anaprox [Naproxen Sodium] Shortness Of Breath    Redness of skin and difficulty breathing  . Aspirin Nausea And Vomiting    Severe cramping, vomiting  . Ceftin [Cefuroxime Axetil] Shortness Of Breath    Skin turns red  . Celexa [Citalopram Hydrobromide] Shortness Of Breath    Breathing difficulty  . Ciprofloxacin Shortness Of Breath    Skin turns red  . Coconut Fragrance Hives, Shortness Of Breath and Swelling    Reaction to any kind of coconut Mouth and tongue swelling  . Contrast Media [Iodinated Diagnostic Agents] Shortness Of Breath    Breathing difficulty  . Cortisol [Hydrocortisone] Shortness Of Breath    Breathing difficulty and redness of skin  . Elavil [Amitriptyline] Shortness Of Breath     Redness of skin and blisters of skin Difficulty breathing  . Hctz [Hydrochlorothiazide] Shortness Of Breath  . Imitrex [Sumatriptan] Shortness Of Breath    Redness and difficulty breathing  . Lexapro [Escitalopram Oxalate] Shortness Of Breath  Breathing difficulty Redness to skin  . Methadone Nausea And Vomiting    Severely vomiting  . Motrin [Ibuprofen] Nausea And Vomiting  . Naprosyn [Naproxen] Shortness Of Breath    Breathing difficulty Redness of skin  . Neurontin [Gabapentin] Shortness Of Breath    Breathing difficulty  Redness to skin  . Penicillins Shortness Of Breath    Redness to skin Has patient had a PCN reaction causing immediate rash, facial/tongue/throat swelling, SOB or lightheadedness with hypotension: Yes Has patient had a PCN reaction causing severe rash involving mucus membranes or skin necrosis: No Has patient had a PCN reaction that required hospitalization pt was in the hospital at time of reaction Has patient had a PCN reaction occurring within the last 10 years: No If all of the above answers are "NO", then may proceed with Cephalosporin use.  . Sulfa Antibiotics Shortness Of Breath    Severe breathing difficulty And redness of skin  . Tagamet [Cimetidine] Nausea And Vomiting    Severe vomiting  . Toradol [Ketorolac Tromethamine] Shortness Of Breath    Redness to skin  . Vancomycin Shortness Of Breath  . Zoloft [Sertraline Hcl] Shortness Of Breath    Breathing difficulty Redness to skin  . Zyvox [Linezolid] Nausea And Vomiting  . Iodine Swelling    Redness to skin     Procedures/Studies:  Punch biopsy  No results found.     Discharge Exam: Vitals:   04/08/16 2159 04/09/16 0557  BP: (!) 144/84 127/72  Pulse: 89 81  Resp: 18 18  Temp: 98 F (36.7 C) 98.8 F (37.1 C)   Vitals:   04/08/16 0512 04/08/16 1312 04/08/16 2159 04/09/16 0557  BP: 132/75 (!) 164/87 (!) 144/84 127/72  Pulse: 89 89 89 81  Resp: _0 Temp: 98.4 F  (36.9 C) 98.6 F (37 C) 98 F (36.7 C) 98.8 F (37.1 C)  TempSrc: Oral Oral Oral Oral  SpO2: 97% 97% 99% 100%  Weight:      Height:        General: Pt is alert, awake, not in acute distress Cardiovascular: RRR, S1/S2 +, no rubs, no gallops Respiratory: CTA bilaterally, no wheezing, no rhonchi Abdominal: Soft, NT, ND, bowel sounds + Extremities: no edema, no cyanosis    The results of significant diagnostics from this hospitalization (including imaging, microbiology, ancillary and laboratory) are listed below for reference.     Microbiology: Recent Results (from the past 240 hour(s))  Culture, blood (routine x 2)     Status: None (Preliminary result)   Collection Time: 04/04/16 10:35 PM  Result Value Ref Range Status   Specimen Description BLOOD RIGHT ARM  Final   Special Requests BOTTLES DRAWN AEROBIC AND ANAEROBIC 5ML  Final   Culture NO GROWTH 4 DAYS  Final   Report Status PENDING  Incomplete  Culture, blood (routine x 2)     Status: None (Preliminary result)   Collection Time: 04/04/16 10:41 PM  Result Value Ref Range Status   Specimen Description BLOOD LEFT ARM  Final   Special Requests BOTTLES DRAWN AEROBIC AND ANAEROBIC 5ML  Final   Culture NO GROWTH 4 DAYS  Final   Report Status PENDING  Incomplete     Labs: BNP (last 3 results) No results for input(s): BNP in the last 8760 hours. Basic Metabolic Panel:  Recent Labs Lab 04/04/16 2251 04/05/16 0550 04/06/16 0324 04/07/16 0438  NA 135 135 134* 137  K 3.5 3.3* 4.1 4.4  CL 101  102 103 103  CO2 _0 GLUCOSE 328* 212* 316* 205*  BUN 7 6 <5* 5*  CREATININE 0.73 0.60 0.67 0.64  CALCIUM 8.3* 8.0* 8.1* 8.8*   Liver Function Tests:  Recent Labs Lab 04/04/16 2251 04/06/16 0324 04/07/16 0438  AST 43* 33 40  ALT 47 42 55*  ALKPHOS 59 57 59  BILITOT 0.7 0.1* 0.5  PROT 5.6* 5.7* 5.9*  ALBUMIN 3.2* 2.8* 2.9*   No results for input(s): LIPASE, AMYLASE in the last 168 hours. No results for  input(s): AMMONIA in the last 168 hours. CBC:  Recent Labs Lab 04/04/16 2251 04/05/16 0550 04/06/16 0324 04/07/16 0438  WBC 7.7 7.4 5.9 6.1  NEUTROABS 4.5  --   --  3.4  HGB 14.2 13.8 13.4 14.6  HCT 41.0 41.1 39.9 43.1  MCV 91.3 91.9 92.1 92.5  PLT 182 168 173 180   Cardiac Enzymes:  Recent Labs Lab 04/05/16 0550  CKTOTAL 729*   BNP: Invalid input(s): POCBNP CBG:  Recent Labs Lab 04/08/16 0733 04/08/16 1104 04/08/16 1654 04/08/16 2151 04/09/16 0734  GLUCAP 241* 270* 336* 297* 185*   D-Dimer No results for input(s): DDIMER in the last 72 hours. Hgb A1c No results for input(s): HGBA1C in the last 72 hours. Lipid Profile No results for input(s): CHOL, HDL, LDLCALC, TRIG, CHOLHDL, LDLDIRECT in the last 72 hours. Thyroid function studies No results for input(s): TSH, T4TOTAL, T3FREE, THYROIDAB in the last 72 hours.  Invalid input(s): FREET3 Anemia work up No results for input(s): VITAMINB12, FOLATE, FERRITIN, TIBC, IRON, RETICCTPCT in the last 72 hours. Urinalysis    Component Value Date/Time   COLORURINE YELLOW 04/04/2016 Gray 04/04/2016 0152   LABSPEC 1.035 (H) 04/04/2016 0152   PHURINE 5.0 04/04/2016 0152   GLUCOSEU >=500 (A) 04/04/2016 0152   HGBUR NEGATIVE 04/04/2016 0152   BILIRUBINUR NEGATIVE 04/04/2016 0152   KETONESUR NEGATIVE 04/04/2016 0152   PROTEINUR NEGATIVE 04/04/2016 0152   NITRITE NEGATIVE 04/04/2016 0152   LEUKOCYTESUR NEGATIVE 04/04/2016 0152   Sepsis Labs Invalid input(s): PROCALCITONIN,  WBC,  LACTICIDVEN Microbiology Recent Results (from the past 240 hour(s))  Culture, blood (routine x 2)     Status: None (Preliminary result)   Collection Time: 04/04/16 10:35 PM  Result Value Ref Range Status   Specimen Description BLOOD RIGHT ARM  Final   Special Requests BOTTLES DRAWN AEROBIC AND ANAEROBIC 5ML  Final   Culture NO GROWTH 4 DAYS  Final   Report Status PENDING  Incomplete  Culture, blood (routine x 2)      Status: None (Preliminary result)   Collection Time: 04/04/16 10:41 PM  Result Value Ref Range Status   Specimen Description BLOOD LEFT ARM  Final   Special Requests BOTTLES DRAWN AEROBIC AND ANAEROBIC 5ML  Final   Culture NO GROWTH 4 DAYS  Final   Report Status PENDING  Incomplete     Time coordinating discharge: Over 30 minutes  SIGNED:   Debbe Odea, MD  [facility] 04/09/2016, 9:30 AM Pager   If 7PM-7AM, please contact night-coverage [email or web address] Password [email or web address]

## 2016-04-08 NOTE — Progress Notes (Signed)
Teaching given about insulin administration. Patient showed this swriter on how to give it using the insulin kit.

## 2016-04-08 NOTE — Progress Notes (Addendum)
Spoke with patient regarding elevated A1C and new to insulin.  She states that the MD has already spoken with her about insulin.  Discussed current A1C=10.6% and that goal is <7%.  She is in the process of getting a new PCP.  Discussed hypoglycemia signs, symptoms and treatment including the 15:15 rule of 15 grams of CHO and recheck in 15 minutes.  Patient states that blood sugars have worsened with current infection.  She is agreeable to insulin administration as needed.  She also needs new meter as she states hers is old. May consider Levemir insulin pen for convenience as well.  Will order insulin pen starter kit, Living Well with Diabetes booklet and diabetes videos for patient to watch. RN to review insulin teaching with patient as well.  Also discussed need to notify PCP of blood sugars<70 and >180 mg/dL.  Discussed with RN as well.  At D/C, consider Levemir Flexpen, Insulin pen needles ([id number]), and blood glucose meter kit (includes Lancets and strips)-[id number].   Thanks, Adah Perl, RN, BC-ADM Inpatient Diabetes Coordinator Pager [phone]

## 2016-04-08 NOTE — Progress Notes (Signed)
Subjective: Legs better   Antibiotics:  Anti-infectives    Start     Dose/Rate Route Frequency Ordered Stop   04/07/16 1000  linezolid (ZYVOX) tablet 600 mg     600 mg Oral Every 12 hours 04/07/16 0932     04/04/16 2300  DAPTOmycin (CUBICIN) 1,000 mg in sodium chloride 0.9 % IVPB  Status:  Discontinued     1,000 mg 240 mL/hr over 30 Minutes Intravenous Every 24 hours 04/04/16 2213 04/05/16 1333      Medications: Scheduled Meds: . amLODipine  10 mg Oral BID  . collagenase   Topical Daily  . enoxaparin (LOVENOX) injection  40 mg Subcutaneous Q24H  . insulin aspart  0-20 Units Subcutaneous TID WC  . insulin aspart  3 Units Subcutaneous TID WC  . insulin detemir  15 Units Subcutaneous QHS  . lidocaine-EPINEPHrine  30 mL Intradermal Once  . linezolid  600 mg Oral Q12H  . loratadine  10 mg Oral Daily  . morphine  60 mg Oral Q12H  . pneumococcal 23 valent vaccine  0.5 mL Intramuscular Tomorrow-1000  . predniSONE  60 mg Oral Q breakfast   Continuous Infusions: PRN Meds:.acetaminophen **OR** acetaminophen, lidocaine, ondansetron **OR** ondansetron (ZOFRAN) IV, oxyCODONE    Objective: Weight change:   Intake/Output Summary (Last 24 hours) at 04/08/16 1035 Last data filed at 04/08/16 6195  Gross per 24 hour  Intake             1200 ml  Output              400 ml  Net              800 ml   Blood pressure 132/75, pulse 89, temperature 98.4 F (36.9 C), temperature source Oral, resp. rate 18, height 5\' 9"  (1.753 m), weight 296 lb 15.4 oz (134.7 kg), SpO2 97 %. Temp:  [98.4 F (36.9 C)-99.3 F (37.4 C)] 98.4 F (36.9 C) (12/22 0512) Pulse Rate:  [89-94] 89 (12/22 0512) Resp:  [18-19] 18 (12/22 0512) BP: (131-148)/(72-86) 132/75 (12/22 0512) SpO2:  [96 %-97 %] 97 % (12/22 0512)  Physical Exam: General: Alert and awake, oriented x3, not in any acute distress. HEENT: anicteric sclera, pupils reactive to light and accommodation, EOMI CVS regular rate, normal r,   no murmur rubs or gallops Chest: clear to auscultation bilaterally, no wheezing, rales or rhonchi Abdomen: soft nontender, nondistended, normal bowel sounds, Extremities:     Skin:  Legs are markedly improved vs when I first examined her  Left leg 04/05/16     04/08/16:    Right 04/05/2016:    Right leg 04/08/2016:       Neuro: nonfocal  CBC:  CBC Latest Ref Rng & Units 04/07/2016 04/06/2016 04/05/2016  WBC 4.0 - 10.5 K/uL 6.1 5.9 7.4  Hemoglobin 12.0 - 15.0 g/dL 09.3 26.7 12.4  Hematocrit 36.0 - 46.0 % 43.1 39.9 41.1  Platelets 150 - 400 K/uL 180 173 168      BMET  Recent Labs  04/06/16 0324 04/07/16 0438  NA 134* 137  K 4.1 4.4  CL 103 103  CO2 28 28  GLUCOSE 316* 205*  BUN <5* 5*  CREATININE 0.67 0.64  CALCIUM 8.1* 8.8*     Liver Panel   Recent Labs  04/06/16 0324 04/07/16 0438  PROT 5.7* 5.9*  ALBUMIN 2.8* 2.9*  AST 33 40  ALT 42 55*  ALKPHOS 57 59  BILITOT 0.1* 0.5  Sedimentation Rate  Recent Labs  04/06/16 0324  ESRSEDRATE 17   C-Reactive Protein  Recent Labs  04/06/16 0324  CRP 6.1*    Micro Results: Recent Results (from the past 720 hour(s))  Culture, blood (routine x 2)     Status: None (Preliminary result)   Collection Time: 04/04/16 10:35 PM  Result Value Ref Range Status   Specimen Description BLOOD RIGHT ARM  Final   Special Requests BOTTLES DRAWN AEROBIC AND ANAEROBIC 5ML  Final   Culture NO GROWTH 3 DAYS  Final   Report Status PENDING  Incomplete  Culture, blood (routine x 2)     Status: None (Preliminary result)   Collection Time: 04/04/16 10:41 PM  Result Value Ref Range Status   Specimen Description BLOOD LEFT ARM  Final   Special Requests BOTTLES DRAWN AEROBIC AND ANAEROBIC 5ML  Final   Culture NO GROWTH 3 DAYS  Final   Report Status PENDING  Incomplete    Studies/Results: No results found.    Assessment/Plan:  INTERVAL HISTORY: both legs dramatically better   Principal  Problem:   Cellulitis Active Problems:   Chronic pain syndrome   Skin ulceration (HCC)   Diabetes mellitus type 2 in obese (HCC)   Pyoderma gangrenosum    Brittney Banks is a 45 y.o. female with  Chronic recurrent ulcers of unknown etiology having been treated with multiple courses of antibiotics status post biopsy by general surgery which was not conclusive. She had received one dose of daptomycin on the 18th but this was then stopped after a high CPK level on 100 felt that she did not need antibiotics and sincerely. She already had improvement in her legs with wrapping them and proper wound care. Since we started her nose on 60 mg and Zyvox she has improved further.  #1 Ulcerative leg lesions: I was concerned this might be pyoderma gangrenosum or some other autoimmune vasculitis. The dermatopathic did not prove this but I feel it is worthwhile continuing corticosteroids.  She has better with wound care and steroids and Zyvox. Perhaps I should not of added Zyvox as well but I was a bit apprehensive about not having her on antimicrobial in particular given the fact that she was going to be discharged home soon after we started steroids.  I would send her out on a 10 day course of prednisone 60 mg and Zyvox 600 mg twice daily.  In the interim [facility]  We will also arrange for hospital follow-up with our clinic in early January.  She is stable for discharge.   LOS: 4 days   Brittney Banks 04/08/2016, 10:35 AM

## 2016-04-08 NOTE — Care Management Note (Addendum)
Case Management Note  Patient Details  Name: CAIDYN RUNSER MRN: [id number] Date of Birth: [birth year]  Subjective/Objective:                    Action/Plan: PCP DR Alyson Locket [place]  Su Hilt at [facility] , they are unable to accept referral at present due to staffing. Patient aware and would like [facility]. Referral given to Clydie Braun with [facility]. Confirmed face sheet information with patient. Patient would like home health services through [facility] call same awaiting call back.  Expected Discharge Date:                  Expected Discharge Plan:  Home w Home Health Services  In-House Referral:     Discharge planning Services  CM Consult  Post Acute Care Choice:  Home Health Choice offered to:  Patient  DME Arranged:    DME Agency:     HH Arranged:  RN HH Agency:   [facility]   Status of Service:  In process, will continue to follow  If discussed at Long Length of Stay Meetings, dates discussed:    Additional Comments:  Kingsley Plan, RN 04/08/2016, 11:47 AM

## 2016-04-09 LAB — GLUCOSE, CAPILLARY
Glucose-Capillary: 185 mg/dL — ABNORMAL HIGH (ref 65–99)
Glucose-Capillary: 274 mg/dL — ABNORMAL HIGH (ref 65–99)

## 2016-04-09 LAB — CULTURE, BLOOD (ROUTINE X 2)
CULTURE: NO GROWTH
Culture: NO GROWTH

## 2016-04-09 MED ORDER — INSULIN STARTER KIT- PEN NEEDLES (ENGLISH): 1.0000 | Freq: Once | 0 refills | Status: AC

## 2016-04-09 MED ORDER — INSULIN ASPART 100 UNIT/ML FLEXPEN: 3.0000 [IU] | PEN_INJECTOR | Freq: Three times a day (TID) | SUBCUTANEOUS | 11 refills | Status: DC

## 2016-04-09 MED ORDER — FREESTYLE SYSTEM KIT: 1.0000 | PACK | 1 refills | Status: AC | PRN

## 2016-04-09 MED ORDER — INSULIN DETEMIR 100 UNIT/ML ~~LOC~~ SOLN: 20.0000 [IU] | Freq: Every day | SUBCUTANEOUS | 11 refills | Status: DC

## 2016-04-09 NOTE — Progress Notes (Signed)
[facility]  Patient examined and d/c plan discussed in detail today. She is stable for d/c today. Please see d/c summary done on 04/08/16.   Calvert Cantor, MD

## 2016-04-09 NOTE — Progress Notes (Signed)
Discharge instructions gone over with patient. Home medications discussed. Follow up appointment is made. Prescriptions given. Patient has demonstrated checking her blood glucose level and administering insulin. Carb modified diet discussed and information given about counting carbs and proportion sizing. Also insulin starter kit was given and managing diabetes booklet was given. Patient verbalized understanding of instructions. [facility] will be following patient at home.

## 2016-04-09 NOTE — Discharge Instructions (Signed)
Dermatology to decide if to continue Prednisone.   Please take all your medications with you for your next visit with your Primary MD. Please request your Primary MD to go over all hospital test results at the follow up. Please ask your Primary MD to get all Hospital records sent to his/her office.  If you experience worsening of your admission symptoms, develop shortness of breath, chest pain, suicidal or homicidal thoughts or a life threatening emergency, you must seek medical attention immediately by calling 911 or calling your MD.  Brittney Banks must read the complete instructions/literature along with all the possible adverse reactions/side effects for all the medicines you take including new medications that have been prescribed to you. Take new medicines after you have completely understood and accpet all the possible adverse reactions/side effects.   Do not drive when taking pain medications or sedatives.    Do not take more than prescribed Pain, Sleep and Anxiety Medications  If you have smoked or chewed Tobacco in the last 2 yrs please stop. Stop any regular alcohol and or recreational drug use.  Wear Seat belts while driving.

## 2016-04-10 DIAGNOSIS — L03115 Cellulitis of right lower limb: Secondary | ICD-10-CM | POA: Diagnosis not present

## 2016-04-10 DIAGNOSIS — Z96651 Presence of right artificial knee joint: Secondary | ICD-10-CM | POA: Diagnosis not present

## 2016-04-10 DIAGNOSIS — Z794 Long term (current) use of insulin: Secondary | ICD-10-CM | POA: Diagnosis not present

## 2016-04-10 DIAGNOSIS — Z92241 Personal history of systemic steroid therapy: Secondary | ICD-10-CM | POA: Diagnosis not present

## 2016-04-10 DIAGNOSIS — L97221 Non-pressure chronic ulcer of left calf limited to breakdown of skin: Secondary | ICD-10-CM | POA: Diagnosis not present

## 2016-04-10 DIAGNOSIS — L03116 Cellulitis of left lower limb: Secondary | ICD-10-CM | POA: Diagnosis not present

## 2016-04-10 DIAGNOSIS — L97211 Non-pressure chronic ulcer of right calf limited to breakdown of skin: Secondary | ICD-10-CM | POA: Diagnosis not present

## 2016-04-10 DIAGNOSIS — Z7984 Long term (current) use of oral hypoglycemic drugs: Secondary | ICD-10-CM | POA: Diagnosis not present

## 2016-04-10 DIAGNOSIS — I10 Essential (primary) hypertension: Secondary | ICD-10-CM | POA: Diagnosis not present

## 2016-04-10 DIAGNOSIS — G894 Chronic pain syndrome: Secondary | ICD-10-CM | POA: Diagnosis not present

## 2016-04-10 DIAGNOSIS — E119 Type 2 diabetes mellitus without complications: Secondary | ICD-10-CM | POA: Diagnosis not present

## 2016-04-13 DIAGNOSIS — L97221 Non-pressure chronic ulcer of left calf limited to breakdown of skin: Secondary | ICD-10-CM | POA: Diagnosis not present

## 2016-04-13 DIAGNOSIS — L03115 Cellulitis of right lower limb: Secondary | ICD-10-CM | POA: Diagnosis not present

## 2016-04-13 DIAGNOSIS — G894 Chronic pain syndrome: Secondary | ICD-10-CM | POA: Diagnosis not present

## 2016-04-13 DIAGNOSIS — E119 Type 2 diabetes mellitus without complications: Secondary | ICD-10-CM | POA: Diagnosis not present

## 2016-04-13 DIAGNOSIS — L03116 Cellulitis of left lower limb: Secondary | ICD-10-CM | POA: Diagnosis not present

## 2016-04-13 DIAGNOSIS — L97211 Non-pressure chronic ulcer of right calf limited to breakdown of skin: Secondary | ICD-10-CM | POA: Diagnosis not present

## 2016-04-14 DIAGNOSIS — E119 Type 2 diabetes mellitus without complications: Secondary | ICD-10-CM | POA: Diagnosis not present

## 2016-04-14 DIAGNOSIS — L88 Pyoderma gangrenosum: Secondary | ICD-10-CM | POA: Diagnosis not present

## 2016-04-22 DIAGNOSIS — L97221 Non-pressure chronic ulcer of left calf limited to breakdown of skin: Secondary | ICD-10-CM | POA: Diagnosis not present

## 2016-04-22 DIAGNOSIS — L03115 Cellulitis of right lower limb: Secondary | ICD-10-CM | POA: Diagnosis not present

## 2016-04-22 DIAGNOSIS — L03116 Cellulitis of left lower limb: Secondary | ICD-10-CM | POA: Diagnosis not present

## 2016-04-22 DIAGNOSIS — E119 Type 2 diabetes mellitus without complications: Secondary | ICD-10-CM | POA: Diagnosis not present

## 2016-04-22 DIAGNOSIS — G894 Chronic pain syndrome: Secondary | ICD-10-CM | POA: Diagnosis not present

## 2016-04-22 DIAGNOSIS — L88 Pyoderma gangrenosum: Secondary | ICD-10-CM | POA: Diagnosis not present

## 2016-04-22 DIAGNOSIS — L97211 Non-pressure chronic ulcer of right calf limited to breakdown of skin: Secondary | ICD-10-CM | POA: Diagnosis not present

## 2016-04-25 DIAGNOSIS — L88 Pyoderma gangrenosum: Secondary | ICD-10-CM | POA: Diagnosis not present

## 2016-04-25 DIAGNOSIS — M47817 Spondylosis without myelopathy or radiculopathy, lumbosacral region: Secondary | ICD-10-CM | POA: Diagnosis not present

## 2016-04-25 DIAGNOSIS — M533 Sacrococcygeal disorders, not elsewhere classified: Secondary | ICD-10-CM | POA: Diagnosis not present

## 2016-04-25 DIAGNOSIS — M47812 Spondylosis without myelopathy or radiculopathy, cervical region: Secondary | ICD-10-CM | POA: Diagnosis not present

## 2016-04-25 DIAGNOSIS — M5416 Radiculopathy, lumbar region: Secondary | ICD-10-CM | POA: Diagnosis not present

## 2016-04-28 ENCOUNTER — Ambulatory Visit (INDEPENDENT_AMBULATORY_CARE_PROVIDER_SITE_OTHER): Payer: Medicare Other | Admitting: Infectious Disease

## 2016-04-28 ENCOUNTER — Encounter: Payer: Self-pay | Admitting: Infectious Disease

## 2016-04-28 VITALS — BP 126/82 | HR 81 | Temp 99.0°F | Ht 69.5 in | Wt 287.0 lb

## 2016-04-28 DIAGNOSIS — M961 Postlaminectomy syndrome, not elsewhere classified: Secondary | ICD-10-CM | POA: Diagnosis not present

## 2016-04-28 DIAGNOSIS — A4902 Methicillin resistant Staphylococcus aureus infection, unspecified site: Secondary | ICD-10-CM

## 2016-04-28 DIAGNOSIS — L88 Pyoderma gangrenosum: Secondary | ICD-10-CM | POA: Diagnosis present

## 2016-04-28 DIAGNOSIS — L03119 Cellulitis of unspecified part of limb: Secondary | ICD-10-CM | POA: Diagnosis not present

## 2016-04-28 NOTE — Progress Notes (Signed)
Subjective:   Chief complaint here for follow-up for her skin ulcers now having been found to be consistent with pyoderma gangrenosum per patient after review of slides by [place]- pathology     Patient ID: Brittney Banks, female    DOB: 02/25/1971, 46 y.o.   MRN: [id number]  HPI  46 y.o. female with  Chronic recurrent ulcers of unknown etiology having been treated with multiple courses of antibiotics status post biopsy by general surgery which was not conclusive. We went ahead and gave her prednisone at 60 mg to see if we have improvement in her ulcers and indeed she did have improvement. She essentially was seen by [facility] and Dr. Shaaron Adler pyoderma gangrenosum was considered likely based on her clinicals history. Her slides were reviewed by [facility] and Dr.  Prince Rome who felt findings could be due to PG.  Patient remains on quarter steroids with plans for steroid sparing agent in the future.  She is not on antibiotics this point time but continues on unknown boots.  I foraging come back as needed to [facility] for superinfection of her pyoderma gangrenosum shins but that she does not need a specific appointment with [place] at this point in time.  Past Medical History:  Diagnosis Date  . Allergy   . Cellulitis 03/2016  . DDD (degenerative disc disease), lumbar   . Diabetes mellitus without complication ([place])    type 2  . Hypertension   . MRSA carrier 2008  . MRSA infection 2008   BOTH LEGS    Past Surgical History:  Procedure Laterality Date  . BILATERAL CARPAL TUNNEL RELEASE  2007  . ECTOPIC PREGNANCY SURGERY     2 SURGERIES  . NECK SURGERY N/A 2008  . REPLACEMENT TOTAL KNEE BILATERAL Right   . TONSILLECTOMY      Family History  Problem Relation Age of Onset  . Adopted: Yes  . Family history unknown: Yes      Social History   Social History  . Marital status: Single    Spouse name: N/A  . Number of children: N/A  . Years of  education: N/A   Social History Main Topics  . Smoking status: Former Smoker    Quit date: 08/17/2010  . Smokeless tobacco: Never Used  . Alcohol use No  . Drug use: No  . Sexual activity: Not Asked   Other Topics Concern  . None   Social History Narrative  . None    Allergies  Allergen Reactions  . Anaprox [Naproxen Sodium] Shortness Of Breath    Redness of skin and difficulty breathing Redness of skin and difficulty breathing  . Aspirin Nausea And Vomiting    Severe cramping, vomiting Severe cramping, vomiting  . Ceftin [Cefuroxime Axetil] Shortness Of Breath    Skin turns red Skin turns red  . Celexa [Citalopram Hydrobromide] Shortness Of Breath    Breathing difficulty Breathing difficulty  . Ciprofloxacin Shortness Of Breath    Skin turns red Skin turns red  . Coconut Fragrance Hives, Shortness Of Breath and Swelling    Reaction to any kind of coconut Mouth and tongue swelling  . Contrast Media [Iodinated Diagnostic Agents] Shortness Of Breath    Breathing difficulty  . Cortisol [Hydrocortisone] Shortness Of Breath    Breathing difficulty and redness of skin Breathing difficulty and redness of skin  . Elavil [Amitriptyline] Shortness Of Breath    Redness of skin and blisters of skin Difficulty breathing Redness of  skin and blisters of skin Difficulty breathing  . Hctz [Hydrochlorothiazide] Shortness Of Breath  . Imitrex [Sumatriptan] Shortness Of Breath    Redness and difficulty breathing Redness and difficulty breathing  . Lexapro [Escitalopram Oxalate] Shortness Of Breath    Breathing difficulty Redness to skin Breathing difficulty Redness to skin  . Methadone Nausea And Vomiting    Severely vomiting Severely vomiting  . Metrizamide Shortness Of Breath    Breathing difficulty  . Motrin [Ibuprofen] Nausea And Vomiting  . Naprosyn [Naproxen] Shortness Of Breath    Breathing difficulty Redness of skin Breathing difficulty Redness of skin  .  Neurontin [Gabapentin] Shortness Of Breath    Breathing difficulty  Redness to skin Breathing difficulty  Redness to skin  . Penicillins Shortness Of Breath    Redness to skin Has patient had a PCN reaction causing immediate rash, facial/tongue/throat swelling, SOB or lightheadedness with hypotension: Yes Has patient had a PCN reaction causing severe rash involving mucus membranes or skin necrosis: No Has patient had a PCN reaction that required hospitalization pt was in the hospital at time of reaction Has patient had a PCN reaction occurring within the last 10 years: No If all of the above answers are "NO", then may proceed with Cephalosporin use. Redness to skin Has patient had a PCN reaction causing immediate rash, facial/tongue/throat swelling, SOB or lightheadedness with hypotension: Yes Has patient had a PCN reaction causing severe rash involving mucus membranes or skin necrosis: No Has patient had a PCN reaction that required hospitalization pt was in the hospital at time of reaction Has patient had a PCN reaction occurring within the last 10 years: No If all of the above answers are "NO", then may proceed with Cephalosporin use.  . Sulfa Antibiotics Shortness Of Breath    Severe breathing difficulty And redness of skin  . Sulfasalazine Shortness Of Breath    Severe breathing difficulty And redness of skin  . Tagamet [Cimetidine] Nausea And Vomiting    Severe vomiting Severe vomiting  . Toradol [Ketorolac Tromethamine] Shortness Of Breath    Redness to skin Redness to skin  . Vancomycin Shortness Of Breath  . Zoloft [Sertraline Hcl] Shortness Of Breath    Breathing difficulty Redness to skin Breathing difficulty Redness to skin  . Zyvox [Linezolid] Nausea And Vomiting  . Iodine Swelling    Redness to skin Redness to skin     Current Outpatient Prescriptions:  .  cetirizine (ZYRTEC) 10 MG tablet, Take 10 mg by mouth daily., Disp: , Rfl:  .  collagenase (SANTYL)  ointment, Apply topically daily., Disp: 15 g, Rfl: 0 .  glucose monitoring kit (FREESTYLE) monitoring kit, 1 each by Does not apply route as needed for other. Dispense any model that is covered- dispense testing supplies for Q AC/ HS accuchecks- 1 month supply with one refil., Disp: 1 each, Rfl: 1 .  insulin aspart (NOVOLOG FLEXPEN) 100 UNIT/ML FlexPen, Inject 3-20 Units into the skin 3 (three) times daily with meals. CBG 70 - 120: 0 units  CBG 121 - 150: 3 units  CBG 151 - 200: 4 units  CBG 201 - 250: 7 units  CBG 251 - 300: 11 units  CBG 301 - 350: 15 units  CBG 351 - 400: 20 units, Disp: 15 mL, Rfl: 11 .  insulin detemir (LEVEMIR) 100 UNIT/ML injection, Inject 0.2 mLs (20 Units total) into the skin at bedtime., Disp: 10 mL, Rfl: 11 .  linezolid (ZYVOX) 600 MG tablet, Take 1 tablet (  600 mg total) by mouth every 12 (twelve) hours., Disp: 18 tablet, Rfl: 0 .  lisinopril (PRINIVIL,ZESTRIL) 10 MG tablet, Take 1 tablet (10 mg total) by mouth daily., Disp: 30 tablet, Rfl: 0 .  metFORMIN (GLUCOPHAGE) 850 MG tablet, Take 850 mg by mouth 3 (three) times daily with meals. , Disp: , Rfl:  .  morphine (MS CONTIN) 60 MG 12 hr tablet, Limit 1 tablet by mouth every 8-12 hours if tolerated (Patient taking differently: Take 60 mg by mouth See admin instructions. Take 1 tablet (60 mg) by mouth every 8-10 hours - scheduled), Disp: 90 tablet, Rfl: 0 .  omeprazole (PRILOSEC OTC) 20 MG tablet, Take 20 mg by mouth daily., Disp: , Rfl:  .  Oxycodone HCl 20 MG TABS, Limited 1 tablet by mouth 3-6 times per day if tolerated (Patient taking differently: Take 20 mg by mouth See admin instructions. Take 1 tablet (20 mg) by mouth every 4-6 hours - scheduled), Disp: 170 tablet, Rfl: 0 .  predniSONE (DELTASONE) 20 MG tablet, Take 3 tablets (60 mg total) by mouth daily with breakfast. 60 mg x 3 days, 40 mg x 3 days, 20 mg x 3 days, 10 mg x 3 days, Disp: 40 tablet, Rfl: 0 .  Omeprazole 20 MG TBEC, Take by mouth., Disp: , Rfl:  .   predniSONE (DELTASONE) 10 MG tablet, , Disp: , Rfl:         Review of Systems  Constitutional: Negative for chills and fever.  HENT: Negative for congestion and sore throat.   Eyes: Negative for photophobia.  Respiratory: Negative for cough, shortness of breath and wheezing.   Cardiovascular: Negative for chest pain, palpitations and leg swelling.  Gastrointestinal: Negative for abdominal pain, blood in stool, constipation, diarrhea, nausea and vomiting.  Genitourinary: Negative for dysuria, flank pain and hematuria.  Musculoskeletal: Negative for back pain and myalgias.  Skin: Positive for color change and wound. Negative for rash.  Neurological: Negative for dizziness, weakness and headaches.  Hematological: Does not bruise/bleed easily.  Psychiatric/Behavioral: Negative for suicidal ideas.       Objective:   Physical Exam  Constitutional: She is oriented to person, place, and time. She appears well-developed and well-nourished. No distress.  HENT:  Head: Normocephalic and atraumatic.  Mouth/Throat: No oropharyngeal exudate.  Eyes: Conjunctivae and EOM are normal. No scleral icterus.  Neck: Normal range of motion. Neck supple.  Cardiovascular: Normal rate and regular rhythm.   Pulmonary/Chest: Effort normal. No respiratory distress. She has no wheezes.  Abdominal: She exhibits no distension.  Musculoskeletal: She exhibits no edema or tenderness.  Neurological: She is alert and oriented to person, place, and time. She exhibits normal muscle tone. Coordination normal.  Skin: Skin is warm and dry. She is not diaphoretic.  Psychiatric: She has a normal mood and affect. Her behavior is normal. Judgment and thought content normal.   Legs are wrapped in unna boots       Assessment & Plan:  Pyoderma gangrenosum: Continue corticosteroids and then steroid sparing agents per [facility] dermatology she is welcome to come back and see [place] if needed for infectious disease  complications.  Cellulitis: I think this was largely pyoderma gangrenosum though there could've been a component of superinfection I doubt this was a case recently.

## 2016-04-29 DIAGNOSIS — L97221 Non-pressure chronic ulcer of left calf limited to breakdown of skin: Secondary | ICD-10-CM | POA: Diagnosis not present

## 2016-04-29 DIAGNOSIS — G894 Chronic pain syndrome: Secondary | ICD-10-CM | POA: Diagnosis not present

## 2016-04-29 DIAGNOSIS — L03115 Cellulitis of right lower limb: Secondary | ICD-10-CM | POA: Diagnosis not present

## 2016-04-29 DIAGNOSIS — L97211 Non-pressure chronic ulcer of right calf limited to breakdown of skin: Secondary | ICD-10-CM | POA: Diagnosis not present

## 2016-04-29 DIAGNOSIS — E119 Type 2 diabetes mellitus without complications: Secondary | ICD-10-CM | POA: Diagnosis not present

## 2016-04-29 DIAGNOSIS — L03116 Cellulitis of left lower limb: Secondary | ICD-10-CM | POA: Diagnosis not present

## 2016-05-06 DIAGNOSIS — L03115 Cellulitis of right lower limb: Secondary | ICD-10-CM | POA: Diagnosis not present

## 2016-05-06 DIAGNOSIS — E119 Type 2 diabetes mellitus without complications: Secondary | ICD-10-CM | POA: Diagnosis not present

## 2016-05-06 DIAGNOSIS — L97211 Non-pressure chronic ulcer of right calf limited to breakdown of skin: Secondary | ICD-10-CM | POA: Diagnosis not present

## 2016-05-06 DIAGNOSIS — L03116 Cellulitis of left lower limb: Secondary | ICD-10-CM | POA: Diagnosis not present

## 2016-05-06 DIAGNOSIS — L97221 Non-pressure chronic ulcer of left calf limited to breakdown of skin: Secondary | ICD-10-CM | POA: Diagnosis not present

## 2016-05-06 DIAGNOSIS — G894 Chronic pain syndrome: Secondary | ICD-10-CM | POA: Diagnosis not present

## 2016-05-12 DIAGNOSIS — E119 Type 2 diabetes mellitus without complications: Secondary | ICD-10-CM | POA: Diagnosis not present

## 2016-05-12 DIAGNOSIS — L03115 Cellulitis of right lower limb: Secondary | ICD-10-CM | POA: Diagnosis not present

## 2016-05-12 DIAGNOSIS — L03116 Cellulitis of left lower limb: Secondary | ICD-10-CM | POA: Diagnosis not present

## 2016-05-12 DIAGNOSIS — G894 Chronic pain syndrome: Secondary | ICD-10-CM | POA: Diagnosis not present

## 2016-05-12 DIAGNOSIS — L97211 Non-pressure chronic ulcer of right calf limited to breakdown of skin: Secondary | ICD-10-CM | POA: Diagnosis not present

## 2016-05-12 DIAGNOSIS — L97221 Non-pressure chronic ulcer of left calf limited to breakdown of skin: Secondary | ICD-10-CM | POA: Diagnosis not present

## 2016-05-23 DIAGNOSIS — M47812 Spondylosis without myelopathy or radiculopathy, cervical region: Secondary | ICD-10-CM | POA: Diagnosis not present

## 2016-05-23 DIAGNOSIS — M533 Sacrococcygeal disorders, not elsewhere classified: Secondary | ICD-10-CM | POA: Diagnosis not present

## 2016-05-23 DIAGNOSIS — M5416 Radiculopathy, lumbar region: Secondary | ICD-10-CM | POA: Diagnosis not present

## 2016-05-23 DIAGNOSIS — M47817 Spondylosis without myelopathy or radiculopathy, lumbosacral region: Secondary | ICD-10-CM | POA: Diagnosis not present

## 2016-06-20 DIAGNOSIS — M47817 Spondylosis without myelopathy or radiculopathy, lumbosacral region: Secondary | ICD-10-CM | POA: Diagnosis not present

## 2016-06-20 DIAGNOSIS — M5416 Radiculopathy, lumbar region: Secondary | ICD-10-CM | POA: Diagnosis not present

## 2016-06-20 DIAGNOSIS — G894 Chronic pain syndrome: Secondary | ICD-10-CM | POA: Diagnosis not present

## 2016-06-20 DIAGNOSIS — M47812 Spondylosis without myelopathy or radiculopathy, cervical region: Secondary | ICD-10-CM | POA: Diagnosis not present

## 2016-06-20 DIAGNOSIS — M533 Sacrococcygeal disorders, not elsewhere classified: Secondary | ICD-10-CM | POA: Diagnosis not present

## 2016-06-20 DIAGNOSIS — Z79891 Long term (current) use of opiate analgesic: Secondary | ICD-10-CM | POA: Diagnosis not present

## 2016-07-05 DIAGNOSIS — Z5181 Encounter for therapeutic drug level monitoring: Secondary | ICD-10-CM | POA: Diagnosis not present

## 2016-07-05 DIAGNOSIS — E259 Adrenogenital disorder, unspecified: Secondary | ICD-10-CM | POA: Diagnosis not present

## 2016-07-05 DIAGNOSIS — L88 Pyoderma gangrenosum: Secondary | ICD-10-CM | POA: Diagnosis not present

## 2016-07-05 DIAGNOSIS — Z7984 Long term (current) use of oral hypoglycemic drugs: Secondary | ICD-10-CM | POA: Diagnosis not present

## 2016-07-05 DIAGNOSIS — E119 Type 2 diabetes mellitus without complications: Secondary | ICD-10-CM | POA: Diagnosis not present

## 2016-07-12 DIAGNOSIS — I10 Essential (primary) hypertension: Secondary | ICD-10-CM | POA: Diagnosis not present

## 2016-07-12 DIAGNOSIS — K219 Gastro-esophageal reflux disease without esophagitis: Secondary | ICD-10-CM | POA: Diagnosis not present

## 2016-07-12 DIAGNOSIS — Z794 Long term (current) use of insulin: Secondary | ICD-10-CM | POA: Diagnosis not present

## 2016-07-12 DIAGNOSIS — E119 Type 2 diabetes mellitus without complications: Secondary | ICD-10-CM | POA: Diagnosis not present

## 2016-07-20 DIAGNOSIS — M47812 Spondylosis without myelopathy or radiculopathy, cervical region: Secondary | ICD-10-CM | POA: Diagnosis not present

## 2016-07-20 DIAGNOSIS — M47817 Spondylosis without myelopathy or radiculopathy, lumbosacral region: Secondary | ICD-10-CM | POA: Diagnosis not present

## 2016-07-20 DIAGNOSIS — M5416 Radiculopathy, lumbar region: Secondary | ICD-10-CM | POA: Diagnosis not present

## 2016-07-20 DIAGNOSIS — M533 Sacrococcygeal disorders, not elsewhere classified: Secondary | ICD-10-CM | POA: Diagnosis not present

## 2016-07-20 DIAGNOSIS — Z79891 Long term (current) use of opiate analgesic: Secondary | ICD-10-CM | POA: Diagnosis not present

## 2016-08-15 ENCOUNTER — Inpatient Hospital Stay (HOSPITAL_COMMUNITY)
Admission: EM | Disposition: A | Discharge status: Home Health | Payer: Self-pay | Source: Home / Self Care | Attending: Interventional Cardiology

## 2016-08-15 ENCOUNTER — Inpatient Hospital Stay (HOSPITAL_COMMUNITY)
Admission: EM | Admit: 2016-08-15 | Discharge: 2016-08-19 | DRG: 247 | Disposition: A | Discharge status: Home Health | Payer: Medicare Other | Attending: Interventional Cardiology | Admitting: Interventional Cardiology

## 2016-08-15 DIAGNOSIS — E785 Hyperlipidemia, unspecified: Secondary | ICD-10-CM | POA: Diagnosis present

## 2016-08-15 DIAGNOSIS — Z91018 Allergy to other foods: Secondary | ICD-10-CM | POA: Diagnosis not present

## 2016-08-15 DIAGNOSIS — Z22322 Carrier or suspected carrier of Methicillin resistant Staphylococcus aureus: Secondary | ICD-10-CM | POA: Diagnosis not present

## 2016-08-15 DIAGNOSIS — Z96653 Presence of artificial knee joint, bilateral: Secondary | ICD-10-CM | POA: Diagnosis present

## 2016-08-15 DIAGNOSIS — I25118 Atherosclerotic heart disease of native coronary artery with other forms of angina pectoris: Secondary | ICD-10-CM

## 2016-08-15 DIAGNOSIS — L88 Pyoderma gangrenosum: Secondary | ICD-10-CM | POA: Diagnosis not present

## 2016-08-15 DIAGNOSIS — Z713 Dietary counseling and surveillance: Secondary | ICD-10-CM | POA: Diagnosis not present

## 2016-08-15 DIAGNOSIS — R000 Tachycardia, unspecified: Secondary | ICD-10-CM | POA: Diagnosis not present

## 2016-08-15 DIAGNOSIS — I2111 ST elevation (STEMI) myocardial infarction involving right coronary artery: Secondary | ICD-10-CM

## 2016-08-15 DIAGNOSIS — I2119 ST elevation (STEMI) myocardial infarction involving other coronary artery of inferior wall: Secondary | ICD-10-CM | POA: Diagnosis not present

## 2016-08-15 DIAGNOSIS — Z6841 Body Mass Index (BMI) 40.0 and over, adult: Secondary | ICD-10-CM | POA: Diagnosis not present

## 2016-08-15 DIAGNOSIS — R079 Chest pain, unspecified: Secondary | ICD-10-CM | POA: Diagnosis not present

## 2016-08-15 DIAGNOSIS — R0789 Other chest pain: Secondary | ICD-10-CM | POA: Diagnosis not present

## 2016-08-15 DIAGNOSIS — Z886 Allergy status to analgesic agent status: Secondary | ICD-10-CM | POA: Diagnosis not present

## 2016-08-15 DIAGNOSIS — Z79899 Other long term (current) drug therapy: Secondary | ICD-10-CM

## 2016-08-15 DIAGNOSIS — Z888 Allergy status to other drugs, medicaments and biological substances status: Secondary | ICD-10-CM | POA: Diagnosis not present

## 2016-08-15 DIAGNOSIS — Z881 Allergy status to other antibiotic agents status: Secondary | ICD-10-CM | POA: Diagnosis not present

## 2016-08-15 DIAGNOSIS — I2510 Atherosclerotic heart disease of native coronary artery without angina pectoris: Secondary | ICD-10-CM

## 2016-08-15 DIAGNOSIS — E119 Type 2 diabetes mellitus without complications: Secondary | ICD-10-CM | POA: Diagnosis present

## 2016-08-15 DIAGNOSIS — Z880 Allergy status to penicillin: Secondary | ICD-10-CM

## 2016-08-15 DIAGNOSIS — Z87891 Personal history of nicotine dependence: Secondary | ICD-10-CM

## 2016-08-15 DIAGNOSIS — E7800 Pure hypercholesterolemia, unspecified: Secondary | ICD-10-CM | POA: Diagnosis not present

## 2016-08-15 DIAGNOSIS — Z91041 Radiographic dye allergy status: Secondary | ICD-10-CM

## 2016-08-15 DIAGNOSIS — E669 Obesity, unspecified: Secondary | ICD-10-CM | POA: Diagnosis present

## 2016-08-15 DIAGNOSIS — Z794 Long term (current) use of insulin: Secondary | ICD-10-CM

## 2016-08-15 DIAGNOSIS — I10 Essential (primary) hypertension: Secondary | ICD-10-CM | POA: Diagnosis present

## 2016-08-15 DIAGNOSIS — Z955 Presence of coronary angioplasty implant and graft: Secondary | ICD-10-CM

## 2016-08-15 DIAGNOSIS — I25119 Atherosclerotic heart disease of native coronary artery with unspecified angina pectoris: Secondary | ICD-10-CM

## 2016-08-15 DIAGNOSIS — E1169 Type 2 diabetes mellitus with other specified complication: Secondary | ICD-10-CM | POA: Diagnosis present

## 2016-08-15 HISTORY — DX: Behcet's disease: M35.2

## 2016-08-15 HISTORY — DX: ST elevation (STEMI) myocardial infarction involving right coronary artery: I21.11

## 2016-08-15 HISTORY — PX: LEFT HEART CATH AND CORONARY ANGIOGRAPHY: CATH118249

## 2016-08-15 HISTORY — PX: CORONARY STENT INTERVENTION: CATH118234

## 2016-08-15 LAB — LIPID PANEL
Cholesterol: 206 mg/dL — ABNORMAL HIGH (ref 0–200)
HDL: 26 mg/dL — AB (ref >40)
LDL CALC: 119 mg/dL — AB (ref 0–99)
Total CHOL/HDL Ratio: 7.9 RATIO
Triglycerides: 303 mg/dL — ABNORMAL HIGH (ref <150)
VLDL: 61 mg/dL — ABNORMAL HIGH (ref 0–40)

## 2016-08-15 LAB — CBC
HCT: 43.5 % (ref 36.0–46.0)
HEMATOCRIT: 45.7 % (ref 36.0–46.0)
HEMOGLOBIN: 16.1 g/dL — AB (ref 12.0–15.0)
Hemoglobin: 15.3 g/dL — ABNORMAL HIGH (ref 12.0–15.0)
MCH: 32.4 pg (ref 26.0–34.0)
MCH: 32.5 pg (ref 26.0–34.0)
MCHC: 35.2 g/dL (ref 30.0–36.0)
MCHC: 35.2 g/dL (ref 30.0–36.0)
MCV: 92 fL (ref 78.0–100.0)
MCV: 92.4 fL (ref 78.0–100.0)
Platelets: 177 10*3/uL (ref 150–400)
Platelets: 194 10*3/uL (ref 150–400)
RBC: 4.97 MIL/uL (ref 3.87–5.11)
RBC: 4.71 MIL/uL (ref 3.87–5.11)
RDW: 13.3 % (ref 11.5–15.5)
RDW: 13 % (ref 11.5–15.5)
WBC: 14.8 10*3/uL — ABNORMAL HIGH (ref 4.0–10.5)
WBC: 13.2 10*3/uL — ABNORMAL HIGH (ref 4.0–10.5)

## 2016-08-15 LAB — COMPREHENSIVE METABOLIC PANEL
ALBUMIN: 3.6 g/dL (ref 3.5–5.0)
ALK PHOS: 90 U/L (ref 38–126)
ALT: 46 U/L (ref 14–54)
ALT: 55 U/L — ABNORMAL HIGH (ref 14–54)
ANION GAP: 8 (ref 5–15)
AST: 92 U/L — ABNORMAL HIGH (ref 15–41)
AST: 183 U/L — AB (ref 15–41)
Albumin: 3.3 g/dL — ABNORMAL LOW (ref 3.5–5.0)
Alkaline Phosphatase: 87 U/L (ref 38–126)
Anion gap: 12 (ref 5–15)
BILIRUBIN TOTAL: 0.6 mg/dL (ref 0.3–1.2)
BILIRUBIN TOTAL: 0.6 mg/dL (ref 0.3–1.2)
BUN: 7 mg/dL (ref 6–20)
BUN: 6 mg/dL (ref 6–20)
CALCIUM: 8.7 mg/dL — AB (ref 8.9–10.3)
CHLORIDE: 98 mmol/L — AB (ref 101–111)
CO2: 22 mmol/L (ref 22–32)
CO2: 24 mmol/L (ref 22–32)
CREATININE: 0.67 mg/dL (ref 0.44–1.00)
Calcium: 8.7 mg/dL — ABNORMAL LOW (ref 8.9–10.3)
Chloride: 96 mmol/L — ABNORMAL LOW (ref 101–111)
Creatinine, Ser: 0.69 mg/dL (ref 0.44–1.00)
GFR calc Af Amer: >60 mL/min (ref >60)
GFR calc Af Amer: >60 mL/min (ref >60)
GFR calc non Af Amer: >60 mL/min (ref >60)
GLUCOSE: 275 mg/dL — AB (ref 65–99)
Glucose, Bld: 341 mg/dL — ABNORMAL HIGH (ref 65–99)
POTASSIUM: 4.5 mmol/L (ref 3.5–5.1)
Potassium: 3.6 mmol/L (ref 3.5–5.1)
SODIUM: 130 mmol/L — AB (ref 135–145)
Sodium: 130 mmol/L — ABNORMAL LOW (ref 135–145)
TOTAL PROTEIN: 6.6 g/dL (ref 6.5–8.1)
TOTAL PROTEIN: 6.3 g/dL — AB (ref 6.5–8.1)

## 2016-08-15 LAB — MRSA PCR SCREENING: MRSA BY PCR: NEGATIVE

## 2016-08-15 LAB — BRAIN NATRIURETIC PEPTIDE: B NATRIURETIC PEPTIDE 5: 227 pg/mL — AB (ref 0.0–100.0)

## 2016-08-15 LAB — POCT I-STAT, CHEM 8
BUN: 7 mg/dL (ref 6–20)
CREATININE: 0.5 mg/dL (ref 0.44–1.00)
Calcium, Ion: 1.22 mmol/L (ref 1.15–1.40)
Chloride: 96 mmol/L — ABNORMAL LOW (ref 101–111)
Glucose, Bld: 288 mg/dL — ABNORMAL HIGH (ref 65–99)
HEMATOCRIT: 48 % — AB (ref 36.0–46.0)
HEMOGLOBIN: 16.3 g/dL — AB (ref 12.0–15.0)
Potassium: 3.7 mmol/L (ref 3.5–5.1)
Sodium: 132 mmol/L — ABNORMAL LOW (ref 135–145)
TCO2: 25 mmol/L (ref 0–100)

## 2016-08-15 LAB — GLUCOSE, CAPILLARY
GLUCOSE-CAPILLARY: 314 mg/dL — AB (ref 65–99)
GLUCOSE-CAPILLARY: 372 mg/dL — AB (ref 65–99)

## 2016-08-15 LAB — TSH: TSH: 0.504 u[IU]/mL (ref 0.350–4.500)

## 2016-08-15 LAB — T4, FREE: Free T4: 0.99 ng/dL (ref 0.61–1.12)

## 2016-08-15 LAB — POCT ACTIVATED CLOTTING TIME
ACTIVATED CLOTTING TIME: 268 s
ACTIVATED CLOTTING TIME: 279 s

## 2016-08-15 LAB — TROPONIN I
Troponin I: 7.97 ng/mL (ref <0.03)
Troponin I: >65 ng/mL (ref <0.03)

## 2016-08-15 LAB — PROTIME-INR
INR: 1.08
Prothrombin Time: 14 seconds (ref 11.4–15.2)

## 2016-08-15 LAB — APTT: APTT: 33 s (ref 24–36)

## 2016-08-15 SURGERY — LEFT HEART CATH AND CORONARY ANGIOGRAPHY
Anesthesia: LOCAL

## 2016-08-15 MED ORDER — NITROGLYCERIN 1 MG/10 ML FOR IR/CATH LAB
INTRA_ARTERIAL | Status: AC
  Filled 2016-08-15: qty 10

## 2016-08-15 MED ORDER — FAMOTIDINE IN NACL 20-0.9 MG/50ML-% IV SOLN
INTRAVENOUS | Status: AC
  Filled 2016-08-15: qty 50

## 2016-08-15 MED ORDER — METFORMIN HCL 850 MG PO TABS
850.0000 mg | ORAL_TABLET | Freq: Three times a day (TID) | ORAL | Status: DC
  Administered 2016-08-18 – 2016-08-19 (×4): 850 mg via ORAL
  Filled 2016-08-15 (×7): qty 1

## 2016-08-15 MED ORDER — LORATADINE 10 MG PO TABS
10.0000 mg | ORAL_TABLET | Freq: Every day | ORAL | Status: DC
  Administered 2016-08-16 – 2016-08-19 (×4): 10 mg via ORAL
  Filled 2016-08-15 (×4): qty 1

## 2016-08-15 MED ORDER — ACETAMINOPHEN 325 MG PO TABS: 650.0000 mg | ORAL_TABLET | ORAL | Status: DC | PRN

## 2016-08-15 MED ORDER — OXYCODONE HCL 5 MG PO TABS
20.0000 mg | ORAL_TABLET | Freq: Four times a day (QID) | ORAL | Status: DC | PRN
  Administered 2016-08-15 – 2016-08-19 (×15): 20 mg via ORAL
  Filled 2016-08-15 (×15): qty 4

## 2016-08-15 MED ORDER — HYDRALAZINE HCL 20 MG/ML IJ SOLN: 5.0000 mg | INTRAMUSCULAR | Status: AC | PRN

## 2016-08-15 MED ORDER — METHYLPREDNISOLONE SODIUM SUCC 125 MG IJ SOLR
INTRAMUSCULAR | Status: DC | PRN
  Administered 2016-08-15: 125 mg via INTRAVENOUS

## 2016-08-15 MED ORDER — ENOXAPARIN SODIUM 40 MG/0.4ML ~~LOC~~ SOLN: 40.0000 mg | SUBCUTANEOUS | Status: DC

## 2016-08-15 MED ORDER — NITROGLYCERIN IN D5W 200-5 MCG/ML-% IV SOLN
20.0000 ug/min | INTRAVENOUS | Status: DC
  Administered 2016-08-15: 5 ug/min via INTRAVENOUS
  Filled 2016-08-15: qty 250

## 2016-08-15 MED ORDER — MIDAZOLAM HCL 2 MG/2ML IJ SOLN
INTRAMUSCULAR | Status: AC
  Filled 2016-08-15: qty 2

## 2016-08-15 MED ORDER — DIPHENHYDRAMINE HCL 50 MG/ML IJ SOLN
INTRAMUSCULAR | Status: AC
  Filled 2016-08-15: qty 1

## 2016-08-15 MED ORDER — HEPARIN SODIUM (PORCINE) 1000 UNIT/ML IJ SOLN
INTRAMUSCULAR | Status: AC
  Filled 2016-08-15: qty 1

## 2016-08-15 MED ORDER — TICAGRELOR 90 MG PO TABS
ORAL_TABLET | ORAL | Status: AC
  Filled 2016-08-15: qty 2

## 2016-08-15 MED ORDER — SODIUM CHLORIDE 0.9 % IV SOLN: 250.0000 mL | INTRAVENOUS | Status: DC | PRN

## 2016-08-15 MED ORDER — ONDANSETRON HCL 4 MG/2ML IJ SOLN
INTRAMUSCULAR | Status: DC | PRN
  Administered 2016-08-15: 4 mg via INTRAVENOUS

## 2016-08-15 MED ORDER — FENTANYL CITRATE (PF) 100 MCG/2ML IJ SOLN
INTRAMUSCULAR | Status: DC | PRN
  Administered 2016-08-15 (×2): 25 ug via INTRAVENOUS

## 2016-08-15 MED ORDER — TIROFIBAN HCL IN NACL 5-0.9 MG/100ML-% IV SOLN
0.1500 ug/kg/min | INTRAVENOUS | Status: DC
  Administered 2016-08-15: 0.15 ug/kg/min via INTRAVENOUS

## 2016-08-15 MED ORDER — NITROGLYCERIN 0.4 MG SL SUBL: 0.4000 mg | SUBLINGUAL_TABLET | SUBLINGUAL | Status: DC | PRN

## 2016-08-15 MED ORDER — ONDANSETRON HCL 4 MG/2ML IJ SOLN
INTRAMUSCULAR | Status: AC
  Filled 2016-08-15: qty 2

## 2016-08-15 MED ORDER — INSULIN ASPART 100 UNIT/ML FLEXPEN: 3.0000 [IU] | PEN_INJECTOR | Freq: Three times a day (TID) | SUBCUTANEOUS | Status: DC

## 2016-08-15 MED ORDER — HEPARIN (PORCINE) IN NACL 2-0.9 UNIT/ML-% IJ SOLN
INTRAMUSCULAR | Status: DC | PRN
  Administered 2016-08-15: 1000 mL

## 2016-08-15 MED ORDER — LISINOPRIL 10 MG PO TABS
10.0000 mg | ORAL_TABLET | Freq: Every day | ORAL | Status: DC
  Administered 2016-08-18 – 2016-08-19 (×2): 10 mg via ORAL
  Filled 2016-08-15 (×2): qty 1

## 2016-08-15 MED ORDER — OXYCODONE-ACETAMINOPHEN 5-325 MG PO TABS
1.0000 | ORAL_TABLET | ORAL | Status: DC | PRN
  Administered 2016-08-18: 2 via ORAL
  Administered 2016-08-18: 1 via ORAL
  Administered 2016-08-19 (×3): 2 via ORAL
  Filled 2016-08-15 (×5): qty 2

## 2016-08-15 MED ORDER — ONDANSETRON HCL 4 MG/2ML IJ SOLN: 4.0000 mg | Freq: Four times a day (QID) | INTRAMUSCULAR | Status: DC | PRN

## 2016-08-15 MED ORDER — IOPAMIDOL (ISOVUE-370) INJECTION 76%
INTRAVENOUS | Status: AC
  Filled 2016-08-15: qty 100

## 2016-08-15 MED ORDER — FAMOTIDINE IN NACL 20-0.9 MG/50ML-% IV SOLN
INTRAVENOUS | Status: DC | PRN
  Administered 2016-08-15: 20 mg via INTRAVENOUS

## 2016-08-15 MED ORDER — INSULIN ASPART 100 UNIT/ML ~~LOC~~ SOLN
0.0000 [IU] | Freq: Three times a day (TID) | SUBCUTANEOUS | Status: DC
  Administered 2016-08-16 (×2): 11 [IU] via SUBCUTANEOUS
  Administered 2016-08-16: 7 [IU] via SUBCUTANEOUS
  Administered 2016-08-17 (×3): 11 [IU] via SUBCUTANEOUS
  Administered 2016-08-18: 4 [IU] via SUBCUTANEOUS
  Administered 2016-08-18 (×2): 11 [IU] via SUBCUTANEOUS
  Administered 2016-08-19: 7 [IU] via SUBCUTANEOUS
  Administered 2016-08-19: 4 [IU] via SUBCUTANEOUS

## 2016-08-15 MED ORDER — INSULIN DETEMIR 100 UNIT/ML ~~LOC~~ SOLN
20.0000 [IU] | Freq: Every day | SUBCUTANEOUS | Status: DC
  Administered 2016-08-15 – 2016-08-16 (×2): 20 [IU] via SUBCUTANEOUS
  Filled 2016-08-15 (×2): qty 0.2

## 2016-08-15 MED ORDER — TICAGRELOR 90 MG PO TABS
90.0000 mg | ORAL_TABLET | Freq: Two times a day (BID) | ORAL | Status: DC
  Administered 2016-08-16 – 2016-08-19 (×7): 90 mg via ORAL
  Filled 2016-08-15 (×7): qty 1

## 2016-08-15 MED ORDER — MIDAZOLAM HCL 2 MG/2ML IJ SOLN
INTRAMUSCULAR | Status: DC | PRN
  Administered 2016-08-15: 1 mg via INTRAVENOUS

## 2016-08-15 MED ORDER — PREDNISONE 10 MG PO TABS
10.0000 mg | ORAL_TABLET | Freq: Two times a day (BID) | ORAL | Status: DC
  Administered 2016-08-15 – 2016-08-19 (×8): 10 mg via ORAL
  Filled 2016-08-15 (×8): qty 1

## 2016-08-15 MED ORDER — SODIUM CHLORIDE 0.9 % IV BOLUS (SEPSIS)
500.0000 mL | Freq: Once | INTRAVENOUS | Status: AC
  Administered 2016-08-15: 500 mL via INTRAVENOUS

## 2016-08-15 MED ORDER — HEPARIN SODIUM (PORCINE) 1000 UNIT/ML IJ SOLN
INTRAMUSCULAR | Status: DC | PRN
  Administered 2016-08-15: 2000 [IU] via INTRAVENOUS
  Administered 2016-08-15: 4000 [IU] via INTRAVENOUS
  Administered 2016-08-15: 6000 [IU] via INTRAVENOUS

## 2016-08-15 MED ORDER — LABETALOL HCL 5 MG/ML IV SOLN: 10.0000 mg | INTRAVENOUS | Status: AC | PRN

## 2016-08-15 MED ORDER — CARVEDILOL 3.125 MG PO TABS
3.1250 mg | ORAL_TABLET | Freq: Two times a day (BID) | ORAL | Status: DC
  Administered 2016-08-15 – 2016-08-16 (×2): 3.125 mg via ORAL
  Filled 2016-08-15 (×3): qty 1

## 2016-08-15 MED ORDER — INSULIN ASPART 100 UNIT/ML ~~LOC~~ SOLN
15.0000 [IU] | Freq: Once | SUBCUTANEOUS | Status: AC
  Administered 2016-08-15: 15 [IU] via SUBCUTANEOUS

## 2016-08-15 MED ORDER — TIROFIBAN HCL IN NACL 5-0.9 MG/100ML-% IV SOLN
INTRAVENOUS | Status: AC
  Filled 2016-08-15: qty 100

## 2016-08-15 MED ORDER — LIDOCAINE HCL (PF) 1 % IJ SOLN
INTRAMUSCULAR | Status: AC
  Filled 2016-08-15: qty 30

## 2016-08-15 MED ORDER — COLLAGENASE 250 UNIT/GM EX OINT: TOPICAL_OINTMENT | Freq: Every day | CUTANEOUS | Status: DC

## 2016-08-15 MED ORDER — METHYLPREDNISOLONE SODIUM SUCC 125 MG IJ SOLR
INTRAMUSCULAR | Status: AC
  Filled 2016-08-15: qty 2

## 2016-08-15 MED ORDER — FENTANYL CITRATE (PF) 100 MCG/2ML IJ SOLN
INTRAMUSCULAR | Status: AC
  Filled 2016-08-15: qty 2

## 2016-08-15 MED ORDER — SODIUM CHLORIDE 0.9% FLUSH
3.0000 mL | Freq: Two times a day (BID) | INTRAVENOUS | Status: DC
  Administered 2016-08-16 – 2016-08-19 (×7): 3 mL via INTRAVENOUS

## 2016-08-15 MED ORDER — TIROFIBAN (AGGRASTAT) BOLUS VIA INFUSION
25.0000 ug/kg | Freq: Once | INTRAVENOUS | Status: AC
  Administered 2016-08-15: 50 ug via INTRAVENOUS

## 2016-08-15 MED ORDER — VERAPAMIL HCL 2.5 MG/ML IV SOLN
INTRAVENOUS | Status: AC
  Filled 2016-08-15: qty 2

## 2016-08-15 MED ORDER — TIROFIBAN HCL IN NACL 5-0.9 MG/100ML-% IV SOLN
0.1500 ug/kg/min | INTRAVENOUS | Status: DC
  Administered 2016-08-15 – 2016-08-16 (×4): 0.15 ug/kg/min via INTRAVENOUS
  Filled 2016-08-15: qty 200
  Filled 2016-08-15: qty 100

## 2016-08-15 MED ORDER — TIROFIBAN HCL IV 12.5 MG/250 ML
INTRAVENOUS | Status: DC | PRN
  Administered 2016-08-15: 25 ug/kg/min via INTRAVENOUS

## 2016-08-15 MED ORDER — ENOXAPARIN SODIUM 40 MG/0.4ML ~~LOC~~ SOLN
40.0000 mg | SUBCUTANEOUS | Status: DC
  Administered 2016-08-16: 40 mg via SUBCUTANEOUS
  Filled 2016-08-15: qty 0.4

## 2016-08-15 MED ORDER — SODIUM CHLORIDE 0.9 % WEIGHT BASED INFUSION
1.0000 mL/kg/h | INTRAVENOUS | Status: DC
  Administered 2016-08-15: 1 mL/kg/h via INTRAVENOUS

## 2016-08-15 MED ORDER — NITROGLYCERIN 1 MG/10 ML FOR IR/CATH LAB
INTRA_ARTERIAL | Status: DC | PRN
  Administered 2016-08-15: 200 ug via INTRA_ARTERIAL

## 2016-08-15 MED ORDER — IOPAMIDOL (ISOVUE-370) INJECTION 76%
INTRAVENOUS | Status: DC | PRN
  Administered 2016-08-15: 170 mL via INTRA_ARTERIAL

## 2016-08-15 MED ORDER — MORPHINE SULFATE ER 15 MG PO TBCR
60.0000 mg | EXTENDED_RELEASE_TABLET | Freq: Two times a day (BID) | ORAL | Status: DC
  Administered 2016-08-15 – 2016-08-19 (×8): 60 mg via ORAL
  Filled 2016-08-15: qty 1
  Filled 2016-08-15: qty 4
  Filled 2016-08-15: qty 1
  Filled 2016-08-15: qty 4
  Filled 2016-08-15 (×4): qty 1

## 2016-08-15 MED ORDER — IOPAMIDOL (ISOVUE-370) INJECTION 76%
INTRAVENOUS | Status: AC
  Filled 2016-08-15: qty 125

## 2016-08-15 MED ORDER — HEPARIN (PORCINE) IN NACL 2-0.9 UNIT/ML-% IJ SOLN
INTRAMUSCULAR | Status: AC
  Filled 2016-08-15: qty 1000

## 2016-08-15 MED ORDER — SODIUM CHLORIDE 0.9% FLUSH: 3.0000 mL | INTRAVENOUS | Status: DC | PRN

## 2016-08-15 MED ORDER — DIPHENHYDRAMINE HCL 50 MG/ML IJ SOLN
INTRAMUSCULAR | Status: DC | PRN
Start: 2016-08-15 — End: 2016-08-15
  Administered 2016-08-15: 25 mg via INTRAVENOUS

## 2016-08-15 MED ORDER — SODIUM CHLORIDE 0.9 % IV SOLN
1.0000 mL/kg/h | INTRAVENOUS | Status: DC
  Administered 2016-08-15 – 2016-08-16 (×3): 1 mL/kg/h via INTRAVENOUS

## 2016-08-15 MED ORDER — ATORVASTATIN CALCIUM 80 MG PO TABS
80.0000 mg | ORAL_TABLET | Freq: Every day | ORAL | Status: DC
  Administered 2016-08-15 – 2016-08-18 (×4): 80 mg via ORAL
  Filled 2016-08-15 (×4): qty 1

## 2016-08-15 MED ORDER — TICAGRELOR 90 MG PO TABS
ORAL_TABLET | ORAL | Status: DC | PRN
  Administered 2016-08-15: 180 mg via ORAL

## 2016-08-15 SURGICAL SUPPLY — 20 items
BALLN MOZEC 3.0X14 (BALLOONS) ×2
BALLN ~~LOC~~ EUPHORA RX 4.5X15 (BALLOONS) ×2
BALLOON MOZEC 3.0X14 (BALLOONS) IMPLANT
BALLOON ~~LOC~~ EUPHORA RX 4.5X15 (BALLOONS) IMPLANT
CATH EXPO 5F FL3.5 (CATHETERS) ×1 IMPLANT
CATH EXTRAC PRONTO 5.5F 138CM (CATHETERS) ×1 IMPLANT
CATH INFINITI JR4 5F (CATHETERS) ×1 IMPLANT
CATH VISTA GUIDE 6FR JR4 (CATHETERS) ×1 IMPLANT
DEVICE RAD COMP TR BAND LRG (VASCULAR PRODUCTS) ×1 IMPLANT
GLIDESHEATH SLEND A-KIT 6F 22G (SHEATH) ×1 IMPLANT
GUIDEWIRE INQWIRE 1.5J.035X260 (WIRE) IMPLANT
HOVERMATT SINGLE USE (MISCELLANEOUS) ×1 IMPLANT
INQWIRE 1.5J .035X260CM (WIRE) ×2
KIT ENCORE 26 ADVANTAGE (KITS) ×2 IMPLANT
KIT HEART LEFT (KITS) ×2 IMPLANT
PACK CARDIAC CATHETERIZATION (CUSTOM PROCEDURE TRAY) ×2 IMPLANT
STENT RESOLUTE ONYX 4.5X22 (Permanent Stent) ×1 IMPLANT
TRANSDUCER W/STOPCOCK (MISCELLANEOUS) ×2 IMPLANT
TUBING CIL FLEX 10 FLL-RA (TUBING) ×2 IMPLANT
WIRE ASAHI PROWATER 180CM (WIRE) ×1 IMPLANT

## 2016-08-15 NOTE — Progress Notes (Signed)
Patient noted to have SOB, increased chest pain and hypotension 98/48 (64); Nitro is off.  MD notified; MD order CBC; 500 ml NS Bolus; EKG.  Will continue to monitor.  Ivery Quale RN

## 2016-08-15 NOTE — Plan of Care (Signed)
Problem: Activity: Goal: Risk for activity intolerance will decrease Outcome: Progressing Up to BSC     

## 2016-08-15 NOTE — H&P (Signed)
History & Physical    Patient ID: Brittney Banks MRN: [id number], DOB/AGE: 45/28/1972   Admit date: 08/15/2016   Primary Physician: No primary care provider on file. Primary Cardiologist: New  Patient Profile    46 yo female with PMH of DM, HTN, cellulitis, pyoderma gangrenosum who presented with chest pain and have to have acute inferior STEMI.   Past Medical History   Past Medical History:  Diagnosis Date  . Allergy   . Cellulitis 03/2016  . DDD (degenerative disc disease), lumbar   . Diabetes mellitus without complication ([place])    type 2  . Hypertension   . MRSA carrier 2008  . MRSA infection 2008   BOTH LEGS    Past Surgical History:  Procedure Laterality Date  . BILATERAL CARPAL TUNNEL RELEASE  2007  . ECTOPIC PREGNANCY SURGERY     2 SURGERIES  . NECK SURGERY N/A 2008  . REPLACEMENT TOTAL KNEE BILATERAL Right   . TONSILLECTOMY       Allergies  Allergies  Allergen Reactions  . Anaprox [Naproxen Sodium] Shortness Of Breath    Redness of skin and difficulty breathing Redness of skin and difficulty breathing  . Aspirin Nausea And Vomiting    Severe cramping, vomiting Severe cramping, vomiting  . Ceftin [Cefuroxime Axetil] Shortness Of Breath    Skin turns red Skin turns red  . Celexa [Citalopram Hydrobromide] Shortness Of Breath    Breathing difficulty Breathing difficulty  . Ciprofloxacin Shortness Of Breath    Skin turns red Skin turns red  . Coconut Fragrance Hives, Shortness Of Breath and Swelling    Reaction to any kind of coconut Mouth and tongue swelling  . Contrast Media [Iodinated Diagnostic Agents] Shortness Of Breath    Breathing difficulty  . Cortisol [Hydrocortisone] Shortness Of Breath    Breathing difficulty and redness of skin Breathing difficulty and redness of skin  . Elavil [Amitriptyline] Shortness Of Breath    Redness of skin and blisters of skin Difficulty breathing Redness of skin and blisters of skin Difficulty breathing    . Hctz [Hydrochlorothiazide] Shortness Of Breath  . Imitrex [Sumatriptan] Shortness Of Breath    Redness and difficulty breathing Redness and difficulty breathing  . Lexapro [Escitalopram Oxalate] Shortness Of Breath    Breathing difficulty Redness to skin Breathing difficulty Redness to skin  . Methadone Nausea And Vomiting    Severely vomiting Severely vomiting  . Metrizamide Shortness Of Breath    Breathing difficulty  . Motrin [Ibuprofen] Nausea And Vomiting  . Naprosyn [Naproxen] Shortness Of Breath    Breathing difficulty Redness of skin Breathing difficulty Redness of skin  . Neurontin [Gabapentin] Shortness Of Breath    Breathing difficulty  Redness to skin Breathing difficulty  Redness to skin  . Penicillins Shortness Of Breath    Redness to skin Has patient had a PCN reaction causing immediate rash, facial/tongue/throat swelling, SOB or lightheadedness with hypotension: Yes Has patient had a PCN reaction causing severe rash involving mucus membranes or skin necrosis: No Has patient had a PCN reaction that required hospitalization pt was in the hospital at time of reaction Has patient had a PCN reaction occurring within the last 10 years: No If all of the above answers are "NO", then may proceed with Cephalosporin use. Redness to skin Has patient had a PCN reaction causing immediate rash, facial/tongue/throat swelling, SOB or lightheadedness with hypotension: Yes Has patient had a PCN reaction causing severe rash involving mucus membranes or skin necrosis: No  Has patient had a PCN reaction that required hospitalization pt was in the hospital at time of reaction Has patient had a PCN reaction occurring within the last 10 years: No If all of the above answers are "NO", then may proceed with Cephalosporin use.  . Sulfa Antibiotics Shortness Of Breath    Severe breathing difficulty And redness of skin  . Sulfasalazine Shortness Of Breath    Severe breathing  difficulty And redness of skin  . Tagamet [Cimetidine] Nausea And Vomiting    Severe vomiting Severe vomiting  . Toradol [Ketorolac Tromethamine] Shortness Of Breath    Redness to skin Redness to skin  . Vancomycin Shortness Of Breath  . Zoloft [Sertraline Hcl] Shortness Of Breath    Breathing difficulty Redness to skin Breathing difficulty Redness to skin  . Zyvox [Linezolid] Nausea And Vomiting  . Iodine Swelling    Redness to skin Redness to skin    History of Present Illness    Brittney Banks is a 46 yo female with PMH of DM, HTN, cellulitis, and pyoderma gangrenosum. Reported she has never seen a cardiologist in the past. Remote hx of tobacco use. Does not know her family hx as she is adopted. Was in her usual state of health, until last evening around 11pm. States she was doing laundry when she developed left sided chest pain. She did not seek medical attention at that time. Pain was intermittent throughout the evening. This morning she went to [facility] in [place] and EKG there was concerning for inferior STEMI. EMS was called. She was given 2 SL nitro, no ASA as she has an allergy. Received IVFs.   In the ED, her EKG was reviewed and called an acute inferior STEMI. She was brought to the cath lab emergently for cardiac catheterization. Noted to be tachycardiac, with stable blood pressure on arrival.   Home Medications    Prior to Admission medications   Medication Sig Start Date End Date Taking? Authorizing Provider  cetirizine (ZYRTEC) 10 MG tablet Take 10 mg by mouth daily.    Historical Provider, MD  collagenase (SANTYL) ointment Apply topically daily. 04/09/16   Debbe Odea, MD  glucose monitoring kit (FREESTYLE) monitoring kit 1 each by Does not apply route as needed for other. Dispense any model that is covered- dispense testing supplies for Q AC/ HS accuchecks- 1 month supply with one refil. 04/09/16   Debbe Odea, MD  insulin aspart (NOVOLOG FLEXPEN) 100 UNIT/ML FlexPen  Inject 3-20 Units into the skin 3 (three) times daily with meals. CBG 70 - 120: 0 units  CBG 121 - 150: 3 units  CBG 151 - 200: 4 units  CBG 201 - 250: 7 units  CBG 251 - 300: 11 units  CBG 301 - 350: 15 units  CBG 351 - 400: 20 units 04/09/16   Debbe Odea, MD  insulin detemir (LEVEMIR) 100 UNIT/ML injection Inject 0.2 mLs (20 Units total) into the skin at bedtime. 04/09/16   Debbe Odea, MD  linezolid (ZYVOX) 600 MG tablet Take 1 tablet (600 mg total) by mouth every 12 (twelve) hours. 04/08/16   Debbe Odea, MD  lisinopril (PRINIVIL,ZESTRIL) 10 MG tablet Take 1 tablet (10 mg total) by mouth daily. 04/08/16   Debbe Odea, MD  metFORMIN (GLUCOPHAGE) 850 MG tablet Take 850 mg by mouth 3 (three) times daily with meals.     Historical Provider, MD  morphine (MS CONTIN) 60 MG 12 hr tablet Limit 1 tablet by mouth every 8-12 hours if  tolerated Patient taking differently: Take 60 mg by mouth See admin instructions. Take 1 tablet (60 mg) by mouth every 8-10 hours - scheduled 12/28/15   Mohammed Kindle, MD  omeprazole (PRILOSEC OTC) 20 MG tablet Take 20 mg by mouth daily.    Historical Provider, MD  Omeprazole 20 MG TBEC Take by mouth.    Historical Provider, MD  Oxycodone HCl 20 MG TABS Limited 1 tablet by mouth 3-6 times per day if tolerated Patient taking differently: Take 20 mg by mouth See admin instructions. Take 1 tablet (20 mg) by mouth every 4-6 hours - scheduled 12/28/15   Mohammed Kindle, MD  predniSONE (DELTASONE) 10 MG tablet  04/14/16   Historical Provider, MD  predniSONE (DELTASONE) 20 MG tablet Take 3 tablets (60 mg total) by mouth daily with breakfast. 60 mg x 3 days, 40 mg x 3 days, 20 mg x 3 days, 10 mg x 3 days 04/09/16   Debbe Odea, MD    Family History    Family History  Problem Relation Age of Onset  . Adopted: Yes  . Family history unknown: Yes    Social History    Social History   Social History  . Marital status: Single    Spouse name: N/A  . Number of children:  N/A  . Years of education: N/A   Occupational History  . Not on file.   Social History Main Topics  . Smoking status: Former Smoker    Quit date: 08/17/2010  . Smokeless tobacco: Never Used  . Alcohol use No  . Drug use: No  . Sexual activity: Not on file   Other Topics Concern  . Not on file   Social History Narrative  . No narrative on file     Review of Systems    General:  No chills, fever, night sweats or weight changes.  Cardiovascular: See HPI Dermatological: No rash, lesions/masses Respiratory: No cough, dyspnea Urologic: No hematuria, dysuria Abdominal:   No nausea, vomiting, diarrhea, bright red blood per rectum, melena, or hematemesis Neurologic:  No visual changes, wkns, changes in mental status. All other systems reviewed and are otherwise negative except as noted above.  Physical Exam    SpO2 96 %.  General: Pleasant obese younger WF, NAD Psych: Normal affect. Neuro: Alert and oriented X 3. Moves all extremities spontaneously. HEENT: Normal  Neck: Supple without bruits or JVD. Lungs:  Resp regular and unlabored, CTA. Heart: tachycardiac no s3, s4, or murmurs. Abdomen: Soft, non-tender, non-distended, BS + x 4.  Extremities: No clubbing, cyanosis or edema. DP/PT/Radials 2+ and equal bilaterally.  Labs    Troponin (Point of Care Test) No results for input(s): TROPIPOC in the last 72 hours. No results for input(s): CKTOTAL, CKMB, TROPONINI in the last 72 hours. Lab Results  Component Value Date   WBC 6.1 04/07/2016   HGB 14.6 04/07/2016   HCT 43.1 04/07/2016   MCV 92.5 04/07/2016   PLT 180 04/07/2016   No results for input(s): NA, K, CL, CO2, BUN, CREATININE, CALCIUM, PROT, BILITOT, ALKPHOS, ALT, AST, GLUCOSE in the last 168 hours.  Invalid input(s): LABALBU No results found for: CHOL, HDL, LDLCALC, TRIG No results found for: [facility]   Radiology Studies    No results found.  ECG & Cardiac Imaging    EKG: ST with Acute ST elevation inferior  leads   Assessment & Plan    46 yo female with PMH of DM, HTN, cellulitis, pyoderma gangrenosum who presented with chest pain  and have to have acute inferior STEMI.   1. Acute inferior STEMI: Reports pain began at 11pm on 08/14/16 and lingered throughout the night into the next morning. Went to [facility] in [place], transported to [facility] and STEMI called in the ED. Brought directly to the cath lab with Dr. Tamala Julian. Further recommendations to follow cath. Of note has allergy to aspirin.   2 HTN: Anticipate the addition of BB therapy  3. DM: Check Hgb A1c, SSI  4. Pyoderma Gangrenosum: Reports being followed at [facility], Reino Bellis, NP-C Pager [phone] 08/15/2016, 3:41 PM

## 2016-08-15 NOTE — Progress Notes (Signed)
Critical Value; Troponin 65 @ 2238; MD anticipated troponin elevation during earlier communication; MD paged [phone]; MD returned call [phone] and notified. Will continue to monitor.  Ivery Quale RN

## 2016-08-15 NOTE — Progress Notes (Signed)
   08/15/16 1520  Clinical Encounter Type  Visited With Family  Visit Type Other (Comment) ([facility])  Spiritual Encounters  Spiritual Needs Emotional  Stress Factors  Patient Stress Factors Not reviewed  Family Stress Factors Family relationships  [facility] page. Pt undergoing procedure. Family member in waiting area. No needs at this time.

## 2016-08-16 ENCOUNTER — Encounter (HOSPITAL_COMMUNITY): Payer: Self-pay | Admitting: Interventional Cardiology

## 2016-08-16 LAB — GLUCOSE, CAPILLARY
GLUCOSE-CAPILLARY: 296 mg/dL — AB (ref 65–99)
GLUCOSE-CAPILLARY: 295 mg/dL — AB (ref 65–99)
Glucose-Capillary: 239 mg/dL — ABNORMAL HIGH (ref 65–99)
Glucose-Capillary: 286 mg/dL — ABNORMAL HIGH (ref 65–99)

## 2016-08-16 LAB — BASIC METABOLIC PANEL
Anion gap: 9 (ref 5–15)
BUN: 5 mg/dL — AB (ref 6–20)
CHLORIDE: 102 mmol/L (ref 101–111)
CO2: 21 mmol/L — ABNORMAL LOW (ref 22–32)
CREATININE: 0.6 mg/dL (ref 0.44–1.00)
Calcium: 8.6 mg/dL — ABNORMAL LOW (ref 8.9–10.3)
GFR calc Af Amer: >60 mL/min (ref >60)
GFR calc non Af Amer: >60 mL/min (ref >60)
Glucose, Bld: 272 mg/dL — ABNORMAL HIGH (ref 65–99)
Potassium: 4.1 mmol/L (ref 3.5–5.1)
Sodium: 132 mmol/L — ABNORMAL LOW (ref 135–145)

## 2016-08-16 LAB — CBC
HCT: 43.2 % (ref 36.0–46.0)
Hemoglobin: 15 g/dL (ref 12.0–15.0)
MCH: 32.1 pg (ref 26.0–34.0)
MCHC: 34.7 g/dL (ref 30.0–36.0)
MCV: 92.3 fL (ref 78.0–100.0)
PLATELETS: 204 10*3/uL (ref 150–400)
RBC: 4.68 MIL/uL (ref 3.87–5.11)
RDW: 13.1 % (ref 11.5–15.5)
WBC: 18.9 10*3/uL — AB (ref 4.0–10.5)

## 2016-08-16 LAB — LIPID PANEL
Cholesterol: 200 mg/dL (ref 0–200)
HDL: 29 mg/dL — AB (ref >40)
LDL Cholesterol: 147 mg/dL — ABNORMAL HIGH (ref 0–99)
Total CHOL/HDL Ratio: 6.9 RATIO
Triglycerides: 122 mg/dL (ref <150)
VLDL: 24 mg/dL (ref 0–40)

## 2016-08-16 LAB — TROPONIN I: TROPONIN I: 33.75 ng/mL — AB (ref <0.03)

## 2016-08-16 LAB — HEMOGLOBIN A1C
Hgb A1c MFr Bld: 10.8 % — ABNORMAL HIGH (ref 4.8–5.6)
Mean Plasma Glucose: 263 mg/dL

## 2016-08-16 MED ORDER — CARVEDILOL 3.125 MG PO TABS
3.1250 mg | ORAL_TABLET | Freq: Two times a day (BID) | ORAL | Status: DC
  Administered 2016-08-17 – 2016-08-19 (×5): 3.125 mg via ORAL
  Filled 2016-08-16 (×5): qty 1

## 2016-08-16 MED ORDER — SODIUM CHLORIDE 0.9 % IV BOLUS (SEPSIS): 500.0000 mL | Freq: Once | INTRAVENOUS | Status: DC

## 2016-08-16 MED ORDER — SODIUM CHLORIDE 0.9 % IV BOLUS (SEPSIS)
500.0000 mL | Freq: Once | INTRAVENOUS | Status: AC
  Administered 2016-08-16: 500 mL via INTRAVENOUS

## 2016-08-16 MED FILL — Lidocaine HCl Local Preservative Free (PF) Inj 1%: INTRAMUSCULAR | Qty: 30 | Status: AC

## 2016-08-16 MED FILL — Verapamil HCl IV Soln 2.5 MG/ML: INTRAVENOUS | Qty: 2 | Status: AC

## 2016-08-16 NOTE — Progress Notes (Addendum)
Inpatient Diabetes Program Recommendations  AACE/ADA: New Consensus Statement on Inpatient Glycemic Control (2015)  Target Ranges:  Prepandial:   less than 140 mg/dL      Peak postprandial:   less than 180 mg/dL (1-2 hours)      Critically ill patients:  140 - 180 mg/dL   Results for KEYSHAWNA, WARMATH (MRN [id number]) as of 08/16/2016 10:08  Ref. Range 04/06/2016 03:26 08/15/2016 15:23  Hemoglobin A1C Latest Ref Range: 4.8 - 5.6 % 10.6 (H) 10.8 (H)   Results for MAISEY, SANTA (MRN [id number]) as of 08/16/2016 10:08  Ref. Range 08/15/2016 16:49 08/15/2016 21:09 08/16/2016 07:37  Glucose-Capillary Latest Ref Range: 65 - 99 mg/dL 595 (H) 638 (H) 756 (H)    Admit with: STEMI  History: DM  Home DM Meds: Levemir 20 units QHS       Novolog 3-20 units TID       Metformin 850 mg TID  Current Insulin Orders: Levemir 20 units QHS      Novolog Resistant Correction Scale/ SSI (0-20 units) TID AC + HS      Metformin 850 mg TID (to start Thursday 05/03)      MD- Please consider the following in-hospital insulin adjustments:  1. Increase Levemir to 27 units QHS (0.2 units/kg dosing based on weight of 136 kg)  2. Start Novolog Meal Coverage: Novolog 4 units TID with meals (hold if pt eats <50% of meal)   Addendum 1:40pm: Spoke with pt this AM about her current A1c.  Discussed with patient that her A1c is relatively unchanged since December.  Pt told me she has access to her insulins (Levemir and Novolog) and does not have any questions as to how to take.  Has been taking her insulin as prescribed.  Patient told me she tries to check her CBGs at least TID at home.  Expressed frustration with her elevated CBGs and told me she has been taking Prednisone daily since December and feels like the chronic Prednisone has made her CBGs high for months now.  Patient told me she has established care with a PCP (Dr. Rayetta Pigg in [place]).  Did not have any further questions for me.  Discussed with pt that we will  be checking her CBGs and adjusting her insulin as needed.  Discussed with pt that the doctors may increase her home insulin doses if needed to help better manage her CBGs at home.  Patient agreeable.      --Will follow patient during hospitalization--  Ambrose Finland RN, MSN, CDE Diabetes Coordinator Inpatient Glycemic Control Team Team Pager: [phone] (8a-5p)

## 2016-08-16 NOTE — Progress Notes (Signed)
Sustained SBP <100; manual taken; 92/54 (66) MD notified per order parameters; MD ordered continue to monitor.  Ivery Quale RN

## 2016-08-16 NOTE — Progress Notes (Signed)
.  EKG CRITICAL VALUE     12 lead EKG performed.  Critical value noted.  Gregary Signs, RN notified.   SCALES-PRICE,Jakarius Flamenco, CCT 08/16/2016 7:02 AM

## 2016-08-16 NOTE — Care Management Note (Signed)
Case Management Note Donn Pierini RN, BSN Unit 2W-Case Manager-- 2H coverage [phone]  Patient Details  Name: Brittney Banks MRN: [id number] Date of Birth: 05-11-1970  Subjective/Objective:   Pt admitted with STEMI- s/p emergent cath with DES- started on Brilinta                 Action/Plan: PTA pt lived at home- independent- Pt has medicaid and brilinta is preferred drug under Medicaid- pt given 30 day free card to use on discharge-- per conversation with pt she states she has a Part D plan under Medicare-([employer])- will do a benefits check for coverage benefits on Brilinta.  -- coverage copay under [employer] is $3.70- pt informed at the bedside.   Expected Discharge Date:  08/18/16               Expected Discharge Plan:  Home/Self Care  In-House Referral:     Discharge planning Services  CM Consult, Medication Assistance  Post Acute Care Choice:    Choice offered to:     DME Arranged:    DME Agency:     HH Arranged:    HH Agency:     Status of Service:  In process, will continue to follow  If discussed at Long Length of Stay Meetings, dates discussed:    Discharge Disposition:   Additional Comments:  Darrold Span, RN 08/16/2016, 10:46 AM

## 2016-08-16 NOTE — Research (Signed)
Purewick placed on pt.   DAL-GENE Informed Consent   Subject Name: Brittney Banks  Subject met inclusion and exclusion criteria.  The informed consent form, study requirements and expectations were reviewed with the subject and questions and concerns were addressed prior to the signing of the consent form.  The subject verbalized understanding of the trail requirements.  The subject agreed to participate in the DAL-GENE trial and signed the informed consent.  The informed consent was obtained prior to performance of any protocol-specific procedures for the subject.  A copy of the signed informed consent was given to the subject and a copy was placed in the subject's medical record.  Hedrick,Tammy W 08/16/2016, 0930

## 2016-08-16 NOTE — Progress Notes (Signed)
Per insurance check for Brilinta # 2.  S/W MICHELLE  @ [employer] # [phone]   BRILINTA  90 MG BID   COVER- YES  CO-PAY- $ 3.70  TIER- 3 DRUG  PRIOR APPROVAL- NO   PHARMACY : [facility]

## 2016-08-16 NOTE — Progress Notes (Signed)
Progress Note  Patient Name: Brittney Banks Date of Encounter: 08/16/2016  Primary Cardiologist: New (Dr Katrinka Blazing)  Subjective   Mild chest pain this am (3/10). No shortness of breath. No other complaints today.  Inpatient Medications    Scheduled Meds: . atorvastatin  80 mg Oral q1800  . carvedilol  3.125 mg Oral BID WC  . enoxaparin (LOVENOX) injection  40 mg Subcutaneous Q24H  . insulin aspart  0-20 Units Subcutaneous TID WC  . insulin detemir  20 Units Subcutaneous QHS  . lisinopril  10 mg Oral Daily  . loratadine  10 mg Oral Daily  . [START ON 08/18/2016] metFORMIN  850 mg Oral TID WC  . morphine  60 mg Oral Q12H  . predniSONE  10 mg Oral BID WC  . sodium chloride flush  3 mL Intravenous Q12H  . ticagrelor  90 mg Oral BID   Continuous Infusions: . sodium chloride    . sodium chloride 1 mL/kg/hr (08/16/16 0738)  . nitroGLYCERIN Stopped (08/15/16 2057)  . tirofiban 0.15 mcg/kg/min (08/16/16 0700)   PRN Meds: sodium chloride, acetaminophen, nitroGLYCERIN, ondansetron (ZOFRAN) IV, oxyCODONE, oxyCODONE-acetaminophen, sodium chloride flush   Vital Signs    Vitals:   08/16/16 0530 08/16/16 0600 08/16/16 0630 08/16/16 0700  BP: 96/67 94/64 95/62  97/67  Pulse: 78 81 76 72  Resp: (!) 22 19 (!) 22 (!) 23  Temp:    98.9 F (37.2 C)  TempSrc:    Oral  SpO2: 98% 100% 98% 99%  Weight:      Height:        Intake/Output Summary (Last 24 hours) at 08/16/16 0759 Last data filed at 08/16/16 0700  Gross per 24 hour  Intake          3875.23 ml  Output             2750 ml  Net          1125.23 ml   Filed Weights   08/15/16 1650  Weight: (!) 301 lb 9.4 oz (136.8 kg)    Telemetry    NSR, no significant arrhythmia - Personally Reviewed  ECG    NSR with inferior STEMI pattern - residual ST elevation without significant change this am  - Personally Reviewed  Physical Exam  Pleasant obese woman in NAD GEN: No acute distress.   Neck: No JVD Cardiac: RRR, no murmurs, rubs,  or gallops.  Respiratory: Clear to auscultation bilaterally. GI: Soft, nontender, non-distended  MS: No edema; No deformity. Right radial site clear no hematoma Neuro:  Nonfocal  Psych: Normal affect   Labs    Chemistry Recent Labs Lab 08/15/16 1523 08/15/16 1526 08/15/16 2103 08/16/16 0342  NA 130* 132* 130* 132*  K 3.6 3.7 4.5 4.1  CL 96* 96* 98* 102  CO2 22  --  24 21*  GLUCOSE 275* 288* 341* 272*  BUN 7 7 6  5*  CREATININE 0.67 0.50 0.69 0.60  CALCIUM 8.7*  --  8.7* 8.6*  PROT 6.6  --  6.3*  --   ALBUMIN 3.6  --  3.3*  --   AST 92*  --  183*  --   ALT 46  --  55*  --   ALKPHOS 90  --  87  --   BILITOT 0.6  --  0.6  --   GFRNONAA >60  --  >60 >60  GFRAA >60  --  >60 >60  ANIONGAP 12  --  8 9  Hematology Recent Labs Lab 08/15/16 1523 08/15/16 1526 08/15/16 2151 08/16/16 0342  WBC 14.8*  --  13.2* 18.9*  RBC 4.97  --  4.71 4.68  HGB 16.1* 16.3* 15.3* 15.0  HCT 45.7 48.0* 43.5 43.2  MCV 92.0  --  92.4 92.3  MCH 32.4  --  32.5 32.1  MCHC 35.2  --  35.2 34.7  RDW 13.3  --  13.0 13.1  PLT 177  --  194 204    Cardiac Enzymes Recent Labs Lab 08/15/16 1523 08/15/16 2103 08/16/16 0342  TROPONINI 7.97* >65.00* 33.75*   No results for input(s): TROPIPOC in the last 168 hours.   BNP Recent Labs Lab 08/15/16 2103  BNP 227.0*     DDimer No results for input(s): DDIMER in the last 168 hours.   Radiology    No results found.  Cardiac Studies   Cardiac Cath: Conclusion    Acute inferior ST elevation myocardial infarction commencing approximately 18 hours prior to admission. Ongoing significant chest discomfort with ST elevation noted on EKG.  Total occlusion in the mid RCA of what is a dominant vessel that supplies the entire inferior wall and apical LV.  80-90% diffuse disease in the mid LAD which is a relatively small vessel.  Luminal irregularities noted in a large diagonal of the LAD.  Ostial to proximal 80% stenosis in the circumflex  which is a relatively small vessel.  Irregularities within the ramus intermedius but no high-grade obstruction is noted. Less than 50% obstruction.  Successful PTCA and stent of the mid right coronary from 100% to 0% using an Onyx 4.5 x 22 mm drug-eluting stent postdilated with a 4.5 x 15 mm balloon. Catheter based thrombectomy was applied prior to stenting. TIMI grade 3 flow noted post procedure. Very distal PDA and left ventricular branch embolic obstruction.  The patient is allergic to aspirin.  Recommendations:  Brilinta times at least 6 months before switching to Plavix to complete at least one year, but I would recommend indefinite since the patient is unable to use aspirin.  IV Aggrastat 18 hours.  Beta blocker therapy.  High intensity statin therapy.  Consider staged intervention on the mid LAD diffuse disease. Team should review pictures to determine if this should be done prior to discharge.     Patient Profile     46 y.o. female with late-presenting inferior MI, underwent emergent cath and primary PCI of the RCA  Assessment & Plan    1. Acute inferior STEMI: occlusion of large, dominant RCA s/p PCI with a DES platform. Residual STE on EKG but patient appears stable this am. On brilinta 90 BID (ASA allergic) + 18 hours aggrastat. Reviewed cath films and patient has diffusely diseased and relatively small caliber LAD. I would favor initial medical therapy of her residual disease as long as no recurrent angina. Pt on carvedilol and lisinopril. Will check 2D echo to better assess LV function.  2. Hyperlipidemia: high-intensity statin initiated. Will need aggressive lifestyle modification. LDL 147 mg/dL.  3. Tobacco: quit 6 years ago  4. Morbid obesity: counseling regarding lifestyle modification done.  5. Type II DM: on SSI and levemir insulin. Resume metformin when she is 48 hours post-cath  Dispo: keep in ICU today with residual chest pain. Ok to get up to chair but  would hold off on walking until tomorrow am.   Signed, Tonny Bollman, MD  08/16/2016, 7:59 AM

## 2016-08-16 NOTE — Progress Notes (Signed)
EKG CRITICAL VALUE     12 lead EKG performed.  Critical value noted.  Gregary Signs, RN notified.   Oda Cogan, CCT 08/16/2016 11:41 AM

## 2016-08-16 NOTE — Progress Notes (Addendum)
Patient hypotensive, SBP 70-80 & DBP 40-50.  Patient asymptomatic. Patient's scheduled Coreg due, RN holding at this time.  Spoke with PA [place], [place] advised to not give Coreg and ordered 500 cc normal saline bolus.  RN went in to patient's room and retook BP as patient more awake and bath just completed, BP much improved.  RN waiting on normal saline bolus at this time.

## 2016-08-16 NOTE — Progress Notes (Signed)
CARDIAC REHAB PHASE I   Spoke with RN and sounds like plan for today is BSC and chair. Began discussing MI, stent, restrictions, diet, Brilinta with pt. She voiced understanding. She has questions regarding healing/Brilinta and her autoimmune disease for her cardiologist. Will f/u tomorrow for ambulation and more education. [phone]  Brittney Banks CES, ACSM 08/16/2016 9:47 AM

## 2016-08-17 ENCOUNTER — Inpatient Hospital Stay (HOSPITAL_COMMUNITY): Payer: Medicare Other

## 2016-08-17 DIAGNOSIS — E7800 Pure hypercholesterolemia, unspecified: Secondary | ICD-10-CM

## 2016-08-17 DIAGNOSIS — R079 Chest pain, unspecified: Secondary | ICD-10-CM

## 2016-08-17 LAB — ECHOCARDIOGRAM COMPLETE
AVLVOTPG: 5 mmHg
E/e' ratio: 13.4
EWDT: 197 ms
FS: 18 % — AB (ref 28–44)
Height: 69 in
IV/PV OW: 1.11
LA diam end sys: 38 mm
LA diam index: 1.56 cm/m2
LA vol index: 14.3 mL/m2
LASIZE: 38 mm
LAVOL: 34.8 mL
LAVOLA4C: 27.8 mL
LDCA: 3.8 cm2
LV E/e' medial: 13.4
LV PW d: 11.7 mm — AB (ref 0.6–1.1)
LV SIMPSON'S DISK: 51
LV TDI E'LATERAL: 10.6
LV TDI E'MEDIAL: 9.03
LV dias vol index: 39 mL/m2
LV e' LATERAL: 10.6 cm/s
LVDIAVOL: 94 mL (ref 46–106)
LVEEAVG: 13.4
LVOT SV: 84 mL
LVOT VTI: 22 cm
LVOT diameter: 22 mm
LVOT peak vel: 117 cm/s
LVSYSVOL: 46 mL — AB (ref 14–42)
LVSYSVOLIN: 19 mL/m2
MV Dec: 197
MV pk E vel: 142 m/s
MVPG: 8 mmHg
RV LATERAL S' VELOCITY: 12.1 cm/s
RV TAPSE: 20.8 mm
Stroke v: 48 ml
WEIGHTICAEL: 4825.43 [oz_av]

## 2016-08-17 LAB — BASIC METABOLIC PANEL
Anion gap: 8 (ref 5–15)
BUN: 9 mg/dL (ref 6–20)
CHLORIDE: 103 mmol/L (ref 101–111)
CO2: 24 mmol/L (ref 22–32)
CREATININE: 0.56 mg/dL (ref 0.44–1.00)
Calcium: 8.7 mg/dL — ABNORMAL LOW (ref 8.9–10.3)
GFR calc Af Amer: >60 mL/min (ref >60)
Glucose, Bld: 258 mg/dL — ABNORMAL HIGH (ref 65–99)
Potassium: 3.7 mmol/L (ref 3.5–5.1)
SODIUM: 135 mmol/L (ref 135–145)

## 2016-08-17 LAB — CBC
HCT: 42.3 % (ref 36.0–46.0)
Hemoglobin: 14.3 g/dL (ref 12.0–15.0)
MCH: 31.8 pg (ref 26.0–34.0)
MCHC: 33.8 g/dL (ref 30.0–36.0)
MCV: 94 fL (ref 78.0–100.0)
PLATELETS: 176 10*3/uL (ref 150–400)
RBC: 4.5 MIL/uL (ref 3.87–5.11)
RDW: 13.2 % (ref 11.5–15.5)
WBC: 15.7 10*3/uL — AB (ref 4.0–10.5)

## 2016-08-17 LAB — GLUCOSE, CAPILLARY
Glucose-Capillary: 290 mg/dL — ABNORMAL HIGH (ref 65–99)
Glucose-Capillary: 287 mg/dL — ABNORMAL HIGH (ref 65–99)
Glucose-Capillary: 276 mg/dL — ABNORMAL HIGH (ref 65–99)
Glucose-Capillary: 237 mg/dL — ABNORMAL HIGH (ref 65–99)

## 2016-08-17 MED ORDER — OXYMETAZOLINE HCL 0.05 % NA SOLN
1.0000 | Freq: Two times a day (BID) | NASAL | Status: DC | PRN
  Administered 2016-08-17 – 2016-08-19 (×4): 1 via NASAL
  Filled 2016-08-17: qty 15

## 2016-08-17 MED ORDER — INSULIN DETEMIR 100 UNIT/ML ~~LOC~~ SOLN
15.0000 [IU] | Freq: Two times a day (BID) | SUBCUTANEOUS | Status: DC
  Administered 2016-08-17 – 2016-08-19 (×5): 15 [IU] via SUBCUTANEOUS
  Filled 2016-08-17 (×6): qty 0.15

## 2016-08-17 MED ORDER — ENOXAPARIN SODIUM 80 MG/0.8ML ~~LOC~~ SOLN
70.0000 mg | SUBCUTANEOUS | Status: DC
  Administered 2016-08-17 – 2016-08-19 (×3): 70 mg via SUBCUTANEOUS
  Filled 2016-08-17 (×3): qty 0.8

## 2016-08-17 NOTE — Progress Notes (Signed)
Progress Note  Patient Name: Brittney Banks Date of Encounter: 08/17/2016  Primary Cardiologist: New (Dr Katrinka Blazing)  Subjective   She has mild chest pain this am but improving. She has dyspnea with minimal exertion.   Inpatient Medications    Scheduled Meds: . atorvastatin  80 mg Oral q1800  . carvedilol  3.125 mg Oral BID WC  . enoxaparin (LOVENOX) injection  70 mg Subcutaneous Q24H  . insulin aspart  0-20 Units Subcutaneous TID WC  . insulin detemir  20 Units Subcutaneous QHS  . lisinopril  10 mg Oral Daily  . loratadine  10 mg Oral Daily  . [START ON 08/18/2016] metFORMIN  850 mg Oral TID WC  . morphine  60 mg Oral Q12H  . predniSONE  10 mg Oral BID WC  . sodium chloride flush  3 mL Intravenous Q12H  . ticagrelor  90 mg Oral BID   Continuous Infusions: . sodium chloride Stopped (08/16/16 1000)  . sodium chloride Stopped (08/16/16 0806)  . sodium chloride     PRN Meds: sodium chloride, acetaminophen, nitroGLYCERIN, ondansetron (ZOFRAN) IV, oxyCODONE, oxyCODONE-acetaminophen, sodium chloride flush   Vital Signs    Vitals:   08/17/16 0400 08/17/16 0700 08/17/16 0800 08/17/16 0832  BP:  (!) 143/89 (!) 140/94 129/80  Pulse:  97 (!) 102 (!) 110  Resp:  14 16 19   Temp: 97.5 F (36.4 C) 98.4 F (36.9 C)    TempSrc: Oral Oral    SpO2:  97% 100% 100%  Weight:      Height:        Intake/Output Summary (Last 24 hours) at 08/17/16 0854 Last data filed at 08/17/16 0800  Gross per 24 hour  Intake          1644.92 ml  Output             3500 ml  Net         -1855.08 ml   Filed Weights   08/15/16 1650  Weight: (!) 301 lb 9.4 oz (136.8 kg)    Telemetry   Sinus tach  ECG    Sinus, inferior Q waves  Physical Exam   General: Well developed, obese, NAD  HEENT: OP clear, mucus membranes moist  SKIN: warm, dry. No rashes. Neuro: No focal deficits  Musculoskeletal: Muscle strength 5/5 all ext  Psychiatric: Mood and affect normal  Neck: No JVD, no carotid bruits, no  thyromegaly, no lymphadenopathy.  Lungs:Clear bilaterally, no wheezes, rhonci, crackles Cardiovascular: Regular and tachy. No murmurs, gallops or rubs. Abdomen:Soft. Bowel sounds present. Non-tender.  Extremities: No lower extremity edema. Pulses are 2 + in the bilateral DP/PT.    Labs    Chemistry Recent Labs Lab 08/15/16 1523  08/15/16 2103 08/16/16 0342 08/17/16 0257  NA 130*  < > 130* 132* 135  K 3.6  < > 4.5 4.1 3.7  CL 96*  < > 98* 102 103  CO2 22  --  24 21* 24  GLUCOSE 275*  < > 341* 272* 258*  BUN 7  < > 6 5* 9  CREATININE 0.67  < > 0.69 0.60 0.56  CALCIUM 8.7*  --  8.7* 8.6* 8.7*  PROT 6.6  --  6.3*  --   --   ALBUMIN 3.6  --  3.3*  --   --   AST 92*  --  183*  --   --   ALT 46  --  55*  --   --   ALKPHOS  90  --  87  --   --   BILITOT 0.6  --  0.6  --   --   GFRNONAA >60  --  >60 >60 >60  GFRAA >60  --  >60 >60 >60  ANIONGAP 12  --  8 9 8   < > = values in this interval not displayed.   Hematology  Recent Labs Lab 08/15/16 2151 08/16/16 0342 08/17/16 0257  WBC 13.2* 18.9* 15.7*  RBC 4.71 4.68 4.50  HGB 15.3* 15.0 14.3  HCT 43.5 43.2 42.3  MCV 92.4 92.3 94.0  MCH 32.5 32.1 31.8  MCHC 35.2 34.7 33.8  RDW 13.0 13.1 13.2  PLT 194 204 176    Cardiac Enzymes  Recent Labs Lab 08/15/16 1523 08/15/16 2103 08/16/16 0342  TROPONINI 7.97* >65.00* 33.75*   No results for input(s): TROPIPOC in the last 168 hours.   BNP  Recent Labs Lab 08/15/16 2103  BNP 227.0*     DDimer No results for input(s): DDIMER in the last 168 hours.   Radiology    No results found.  Cardiac Studies   Cardiac Cath: Conclusion    Acute inferior ST elevation myocardial infarction commencing approximately 18 hours prior to admission. Ongoing significant chest discomfort with ST elevation noted on EKG.  Total occlusion in the mid RCA of what is a dominant vessel that supplies the entire inferior wall and apical LV.  80-90% diffuse disease in the mid LAD which is  a relatively small vessel.  Luminal irregularities noted in a large diagonal of the LAD.  Ostial to proximal 80% stenosis in the circumflex which is a relatively small vessel.  Irregularities within the ramus intermedius but no high-grade obstruction is noted. Less than 50% obstruction.  Successful PTCA and stent of the mid right coronary from 100% to 0% using an Onyx 4.5 x 22 mm drug-eluting stent postdilated with a 4.5 x 15 mm balloon. Catheter based thrombectomy was applied prior to stenting. TIMI grade 3 flow noted post procedure. Very distal PDA and left ventricular branch embolic obstruction.  The patient is allergic to aspirin.  Recommendations:  Brilinta times at least 6 months before switching to Plavix to complete at least one year, but I would recommend indefinite since the patient is unable to use aspirin.  IV Aggrastat 18 hours.  Beta blocker therapy.  High intensity statin therapy.  Consider staged intervention on the mid LAD diffuse disease. Team should review pictures to determine if this should be done prior to discharge.     Patient Profile     46 y.o. female with late-presenting inferior MI, underwent emergent cath and primary PCI of the RCA  Assessment & Plan    1. Acute inferior STEMI: Her RCA was occluded and treated with a DES x 1. She is on Brilinta (ASA allergic). Will continue Coreg and Lisinopril. Echo today to assess LVEF. Will plan medical management of LAD disease.   2. Hyperlipidemia: Continue statin  3. Morbid obesity: Weight loss will be essential to her long term prognosis.   4. Type II DM: Will continue SSI and will increase Levemir to 15 BID. Resume metformin tomorrow  Will keep in ICU today given profound deconditioning, chest pain, dyspnea and tachycardia.      Signed, Verne Carrow, MD  08/17/2016, 8:54 AM

## 2016-08-17 NOTE — Progress Notes (Addendum)
CARDIAC REHAB PHASE I   Pt up in chair this morning, reports shortness of breath and chest heaviness with minimal exertion (up to bedside commode, repositioning in bed). Will await rounding MD recommendations prior to ambulation this morning. Will follow-up.   Joylene Grapes, RN, BSN 08/17/2016 8:32 AM

## 2016-08-17 NOTE — Progress Notes (Signed)
  Echocardiogram 2D Echocardiogram has been performed.  Leta Jungling M 08/17/2016, 11:32 AM

## 2016-08-17 NOTE — Progress Notes (Signed)
CARDIAC REHAB PHASE I   PRE:  Rate/Rhythm: 105 ST  BP:  Sitting: 101/66       SaO2: 97 2L, 95 RA  MODE:  Ambulation: 210 ft   POST:  Rate/Rhythm: 122 ST  BP:  Sitting: 107/68         SaO2: 96 RA  Ok to ambulate per MD. Pt ambulated 210 ft on RA, rolling walker, assist x1, slow, steady gait, tolerated fairly well. Pt c/o DOE, fatigue with distance, mild chest heaviness, denies any other complaints, declined rest stop. Reviewed education, discussed ntg and phase 2 cardiac rehab. Pt agrees to phase 2 cardiac rehab referral, will send to [place] per pt request. Pt to bed for echo (currently waiting at bedside) after walk, call bell within reach. Will follow-up tomorrow to ambulate and to discuss diet and exercise.   [id number] Joylene Grapes, RN, BSN 08/17/2016 10:51 AM

## 2016-08-17 NOTE — Progress Notes (Signed)
Inpatient Diabetes Program Recommendations  AACE/ADA: New Consensus Statement on Inpatient Glycemic Control (2015)  Target Ranges:  Prepandial:   less than 140 mg/dL      Peak postprandial:   less than 180 mg/dL (1-2 hours)      Critically ill patients:  140 - 180 mg/dL   Lab Results  Component Value Date   GLUCAP 290 (H) 08/17/2016   HGBA1C 10.8 (H) 08/15/2016    Review of Glycemic Control Results for Brittney Banks, Brittney Banks (MRN [id number]) as of 08/17/2016 10:28  Ref. Range 08/16/2016 07:37 08/16/2016 11:43 08/16/2016 15:46 08/16/2016 21:44 08/17/2016 07:49  Glucose-Capillary Latest Ref Range: 65 - 99 mg/dL 409 (H) 811 (H) 914 (H) 295 (H) 290 (H)   Diabetes history: DM Home DM Meds: Levemir 20 units QHS                             Novolog 3-20 units TID                             Metformin 850 mg TID  Current Insulin Orders: Levemir 15 units BID +  Novolog Resistant Correction Scale/ SSI (0-20 units) TID AC + HS +  Metformin 850 mg TID (to start Thursday 05/03)  Inpatient Diabetes Program Recommendations:    -Please consider Novolog 4 units tid Meal coverage if eats 50%  Thank you, Darel Hong E. Saleen Peden, RN, MSN, CDE  Diabetes Coordinator Inpatient Glycemic Control Team Team Pager [phone] (8am-5pm) 08/17/2016 10:29 AM

## 2016-08-17 NOTE — Plan of Care (Signed)
Problem: Activity: Goal: Ability to return to baseline activity level will improve Outcome: Progressing Up oob to bsc multiple times  Problem: Cardiovascular: Goal: Ability to achieve and maintain adequate cardiovascular perfusion will improve Outcome: Progressing Within parameters

## 2016-08-18 LAB — CBC
HCT: 43.7 % (ref 36.0–46.0)
Hemoglobin: 14.9 g/dL (ref 12.0–15.0)
MCH: 32.2 pg (ref 26.0–34.0)
MCHC: 34.1 g/dL (ref 30.0–36.0)
MCV: 94.4 fL (ref 78.0–100.0)
PLATELETS: 193 10*3/uL (ref 150–400)
RBC: 4.63 MIL/uL (ref 3.87–5.11)
RDW: 13 % (ref 11.5–15.5)
WBC: 13.9 10*3/uL — ABNORMAL HIGH (ref 4.0–10.5)

## 2016-08-18 LAB — GLUCOSE, CAPILLARY
GLUCOSE-CAPILLARY: 256 mg/dL — AB (ref 65–99)
Glucose-Capillary: 178 mg/dL — ABNORMAL HIGH (ref 65–99)
Glucose-Capillary: 262 mg/dL — ABNORMAL HIGH (ref 65–99)

## 2016-08-18 LAB — BASIC METABOLIC PANEL
ANION GAP: 9 (ref 5–15)
BUN: 9 mg/dL (ref 6–20)
CALCIUM: 9.1 mg/dL (ref 8.9–10.3)
CO2: 27 mmol/L (ref 22–32)
CREATININE: 0.68 mg/dL (ref 0.44–1.00)
Chloride: 100 mmol/L — ABNORMAL LOW (ref 101–111)
Glucose, Bld: 236 mg/dL — ABNORMAL HIGH (ref 65–99)
Potassium: 4.3 mmol/L (ref 3.5–5.1)
Sodium: 136 mmol/L (ref 135–145)

## 2016-08-18 NOTE — Progress Notes (Signed)
CARDIAC REHAB PHASE I   PRE:  Rate/Rhythm: 94 SR  BP:  Sitting: 139/91        SaO2: 99 RA  MODE:  Ambulation: 500 ft   POST:  Rate/Rhythm: 109 ST  BP:  Sitting: 120/84         SaO2: 98 RA  Pt ambulated 500 ft on RA, rolling walker, slow, steady gait, tolerated well. Pt c/o mild DOE, denies any other complaints, declined rest stop. Pt able to increase distance today. Completed education. Reviewed previous education, discussed exercise guidelines, heart healthy and diabetes diet. Pt verbalized understanding, very receptive to education. Pt to recliner after walk, call bell within reach. Will follow.    [phone]  Joylene Grapes, RN, BSN 08/18/2016 2:11 PM

## 2016-08-18 NOTE — Progress Notes (Signed)
Pt recievedon 2W room 32. Pt oriented to room, CCMD notified. Reassessment complete, no complaints or needs at  This time.

## 2016-08-18 NOTE — Progress Notes (Signed)
Progress Note  Patient Name: Brittney Banks Date of Encounter: 08/18/2016  Primary Cardiologist: Dr. Katrinka Blazing (new)  Subjective   Reports feeling "not bad" and overall better than yesterday. Only has twinge of pain with greater exertion, otherwise no further CP. Reports felt mild lightheadedness with lower BPs.  Inpatient Medications    Scheduled Meds: . atorvastatin  80 mg Oral q1800  . carvedilol  3.125 mg Oral BID WC  . enoxaparin (LOVENOX) injection  70 mg Subcutaneous Q24H  . insulin aspart  0-20 Units Subcutaneous TID WC  . insulin detemir  15 Units Subcutaneous BID  . lisinopril  10 mg Oral Daily  . loratadine  10 mg Oral Daily  . metFORMIN  850 mg Oral TID WC  . morphine  60 mg Oral Q12H  . predniSONE  10 mg Oral BID WC  . sodium chloride flush  3 mL Intravenous Q12H  . ticagrelor  90 mg Oral BID   Continuous Infusions: . sodium chloride Stopped (08/16/16 1000)  . sodium chloride Stopped (08/16/16 0806)  . sodium chloride     PRN Meds: sodium chloride, acetaminophen, nitroGLYCERIN, ondansetron (ZOFRAN) IV, oxyCODONE, oxyCODONE-acetaminophen, oxymetazoline, sodium chloride flush   Vital Signs    Vitals:   08/18/16 0500 08/18/16 0600 08/18/16 0615 08/18/16 0700  BP: (!) 88/53 (!) 88/50 117/67 110/79  Pulse: 62 70 97 89  Resp: 16 20 (!) 21 17  Temp:      TempSrc:      SpO2: 98% 98% 100% 99%  Weight:      Height:        Intake/Output Summary (Last 24 hours) at 08/18/16 0756 Last data filed at 08/18/16 0300  Gross per 24 hour  Intake              800 ml  Output             1000 ml  Net             -200 ml   Filed Weights   08/15/16 1650  Weight: (!) 301 lb 9.4 oz (136.8 kg)    Telemetry    Sinus tachycardia noted  ECG    5/2: sinus rhythm with inferior Q waves  Physical Exam   GEN: No acute distress, up in chair.   Neck: No JVD Cardiac: RRR, no murmurs, rubs, or gallops.  Respiratory: Clear to auscultation bilaterally. GI: Soft, obese,  nontender, non-distended  MS: No edema; No deformity. Neuro:  Nonfocal  Psych: Normal affect  Skin: Bilateral LE with chronic changes  Labs    Chemistry Recent Labs Lab 08/15/16 1523  08/15/16 2103 08/16/16 0342 08/17/16 0257 08/18/16 0151  NA 130*  < > 130* 132* 135 136  K 3.6  < > 4.5 4.1 3.7 4.3  CL 96*  < > 98* 102 103 100*  CO2 22  --  24 21* 24 27  GLUCOSE 275*  < > 341* 272* 258* 236*  BUN 7  < > 6 5* 9 9  CREATININE 0.67  < > 0.69 0.60 0.56 0.68  CALCIUM 8.7*  --  8.7* 8.6* 8.7* 9.1  PROT 6.6  --  6.3*  --   --   --   ALBUMIN 3.6  --  3.3*  --   --   --   AST 92*  --  183*  --   --   --   ALT 46  --  55*  --   --   --  ALKPHOS 90  --  87  --   --   --   BILITOT 0.6  --  0.6  --   --   --   GFRNONAA >60  --  >60 >60 >60 >60  GFRAA >60  --  >60 >60 >60 >60  ANIONGAP 12  --  8 9 8 9   < > = values in this interval not displayed.   Hematology Recent Labs Lab 08/16/16 0342 08/17/16 0257 08/18/16 0151  WBC 18.9* 15.7* 13.9*  RBC 4.68 4.50 4.63  HGB 15.0 14.3 14.9  HCT 43.2 42.3 43.7  MCV 92.3 94.0 94.4  MCH 32.1 31.8 32.2  MCHC 34.7 33.8 34.1  RDW 13.1 13.2 13.0  PLT 204 176 193    Cardiac Enzymes Recent Labs Lab 08/15/16 1523 08/15/16 2103 08/16/16 0342  TROPONINI 7.97* >65.00* 33.75*   No results for input(s): TROPIPOC in the last 168 hours.   BNP Recent Labs Lab 08/15/16 2103  BNP 227.0*     DDimer No results for input(s): DDIMER in the last 168 hours.   Radiology    No results found.  Cardiac Studies   Echo: - Left ventricle: Mild distal septal and inferior wall hypokinesis.   The cavity size was normal. Wall thickness was increased in a   pattern of mild LVH. Systolic function was normal. The estimated   ejection fraction was in the range of 50% to 55%. Wall motion was   normal; there were no regional wall motion abnormalities. - Atrial septum: No defect or patent foramen ovale was identified. - Impressions: GLS -14.9 lower  limit normal 15.  Cardiac Cath: Conclusion    Acute inferior ST elevation myocardial infarction commencing approximately 18 hours prior to admission. Ongoing significant chest discomfort with ST elevation noted on EKG.  Total occlusion in the mid RCA of what is a dominant vessel that supplies the entire inferior wall and apical LV.  80-90% diffuse disease in the mid LAD which is a relatively small vessel.  Luminal irregularities noted in a large diagonal of the LAD.  Ostial to proximal 80% stenosis in the circumflex which is a relatively small vessel.  Irregularities within the ramus intermedius but no high-grade obstruction is noted. Less than 50% obstruction.  Successful PTCA and stent of the mid right coronary from 100% to 0% using an Onyx 4.5 x 22 mm drug-eluting stent postdilated with a 4.5 x 15 mm balloon. Catheter based thrombectomy was applied prior to stenting. TIMI grade 3 flow noted post procedure. Very distal PDA and left ventricular branch embolic obstruction.  The patient is allergic to aspirin.  Recommendations:  Brilinta times at least 6 months before switching to Plavix to complete at least one year, but I would recommend indefinite since the patient is unable to use aspirin.  IV Aggrastat 18 hours.  Beta blocker therapy.  High intensity statin therapy.  Consider staged intervention on the mid LAD diffuse disease. Team should review pictures to determine if this should be done prior to discharge.     Patient Profile     46 y.o. female presenting late with inferior MI, underwent emergent cath and PCI of the RCA.  Assessment & Plan    #1 Acute inferior STEMI: RCA occluded and was treated with DES x 1.  Due to ASA allergy, she is on Brilinta.  Echo with LVEF 50-55%.  Medical management for LAD disease.  Continue carvedilol and lisinopril.  Continue statin.  #2 HLD: on statin  #  3 Type 2 DM: on SSI and increased Levemir 15units BID yesterday.  Resuming  metformin today.  CBGs remain high and she is on prednisone for pyoderma gangrenosum.  She is eating 100% of her meals. - consider adding on Novolog meal time coverage if CBG remain elevated despite adding metformin today.  Signed, Gwynn Burly, DO  08/18/2016, 7:56 AM    I have personally seen and examined this patient with Dr. Earlene Plater. I agree with the assessment and plan as outlined above. She is doing well this am. She is having minimal chest pressure. Improved. BP stable. She is in sinus this am. Will transfer to telemetry unit. Resume metformin. She will need to ambulate today.   Verne Carrow 08/18/2016 9:16 AM

## 2016-08-19 LAB — GLUCOSE, CAPILLARY
GLUCOSE-CAPILLARY: 220 mg/dL — AB (ref 65–99)
Glucose-Capillary: 166 mg/dL — ABNORMAL HIGH (ref 65–99)

## 2016-08-19 MED ORDER — ATORVASTATIN CALCIUM 80 MG PO TABS: 80.0000 mg | ORAL_TABLET | Freq: Every day | ORAL | 3 refills | Status: DC

## 2016-08-19 MED ORDER — TICAGRELOR 90 MG PO TABS: 90.0000 mg | ORAL_TABLET | Freq: Two times a day (BID) | ORAL | 0 refills | Status: DC

## 2016-08-19 MED ORDER — TICAGRELOR 90 MG PO TABS: 90.0000 mg | ORAL_TABLET | Freq: Two times a day (BID) | ORAL | 3 refills | Status: DC

## 2016-08-19 MED ORDER — NITROGLYCERIN 0.4 MG SL SUBL: 0.4000 mg | SUBLINGUAL_TABLET | SUBLINGUAL | 3 refills | Status: DC | PRN

## 2016-08-19 MED ORDER — METFORMIN HCL 850 MG PO TABS: 850.0000 mg | ORAL_TABLET | Freq: Three times a day (TID) | ORAL | Status: DC

## 2016-08-19 MED ORDER — CARVEDILOL 3.125 MG PO TABS: 3.1250 mg | ORAL_TABLET | Freq: Two times a day (BID) | ORAL | 3 refills | Status: DC

## 2016-08-19 NOTE — Progress Notes (Signed)
CARDIAC REHAB PHASE I   PRE:  Rate/Rhythm: 93 SR    BP: sitting 133/85    SaO2:   MODE:  Ambulation: 550 ft   POST:  Rate/Rhythm: 106 ST    BP: sitting 142/88     SaO2:   Pt ambulated without RW today. Slow pace but steady, tolerated well. HR and BP elevated.  More discussion of diet and ex done, gave pt videos to watch. She would like to build back up to walking her dog 2 miles qd. Eager for d/c. [phone]  Harriet Masson CES, ACSM 08/19/2016 10:19 AM

## 2016-08-19 NOTE — Progress Notes (Signed)
Progress Note  Patient Name: Brittney Banks Date of Encounter: 08/19/2016  Primary Cardiologist: Dr. Katrinka Blazing (new)  Subjective   Patient reports feeling "yucky" last night and 2 episodes of diarrhea.  Better this AM. Denies CP. Ambulated 500 yesterday with RN and tolerated okay. Feels like she is making progress with her exertional dyspnea.  Inpatient Medications    Scheduled Meds: . atorvastatin  80 mg Oral q1800  . carvedilol  3.125 mg Oral BID WC  . enoxaparin (LOVENOX) injection  70 mg Subcutaneous Q24H  . insulin aspart  0-20 Units Subcutaneous TID WC  . insulin detemir  15 Units Subcutaneous BID  . lisinopril  10 mg Oral Daily  . loratadine  10 mg Oral Daily  . metFORMIN  850 mg Oral TID WC  . morphine  60 mg Oral Q12H  . predniSONE  10 mg Oral BID WC  . sodium chloride flush  3 mL Intravenous Q12H  . ticagrelor  90 mg Oral BID   Continuous Infusions: . sodium chloride Stopped (08/16/16 1000)  . sodium chloride Stopped (08/16/16 0806)  . sodium chloride     PRN Meds: sodium chloride, acetaminophen, nitroGLYCERIN, ondansetron (ZOFRAN) IV, oxyCODONE, oxyCODONE-acetaminophen, oxymetazoline, sodium chloride flush   Vital Signs    Vitals:   08/18/16 1100 08/18/16 1957 08/19/16 0559 08/19/16 0651  BP: 135/89 113/69 124/86   Pulse: 100 92 74   Resp: 18 16 18    Temp: 98.2 F (36.8 C) 98 F (36.7 C) (!) 96.9 F (36.1 C) 98.1 F (36.7 C)  TempSrc: Oral Oral Axillary Oral  SpO2: 98% 99% 100%   Weight: (!) 300 lb 7.8 oz (136.3 kg)     Height: 5\' 9"  (1.753 m)       Intake/Output Summary (Last 24 hours) at 08/19/16 0941 Last data filed at 08/18/16 2300  Gross per 24 hour  Intake              420 ml  Output              600 ml  Net             -180 ml   Filed Weights   08/15/16 1650 08/18/16 1100  Weight: (!) 301 lb 9.4 oz (136.8 kg) (!) 300 lb 7.8 oz (136.3 kg)    Telemetry    Sinus rhythm  ECG    5/2: sinus rhythm with inferior Q waves  Physical Exam     GEN: No acute distress, up in recliner.   Neck: No JVD Cardiac: RRR, no murmurs, rubs, or gallops.  Respiratory: Clear to auscultation bilaterally anteriorly. Psych: Normal affect  Skin: Bilateral LE with chronic changes  Labs    Chemistry Recent Labs Lab 08/15/16 1523  08/15/16 2103 08/16/16 0342 08/17/16 0257 08/18/16 0151  NA 130*  < > 130* 132* 135 136  K 3.6  < > 4.5 4.1 3.7 4.3  CL 96*  < > 98* 102 103 100*  CO2 22  --  24 21* 24 27  GLUCOSE 275*  < > 341* 272* 258* 236*  BUN 7  < > 6 5* 9 9  CREATININE 0.67  < > 0.69 0.60 0.56 0.68  CALCIUM 8.7*  --  8.7* 8.6* 8.7* 9.1  PROT 6.6  --  6.3*  --   --   --   ALBUMIN 3.6  --  3.3*  --   --   --   AST 92*  --  183*  --   --   --  ALT 46  --  55*  --   --   --   ALKPHOS 90  --  87  --   --   --   BILITOT 0.6  --  0.6  --   --   --   GFRNONAA >60  --  >60 >60 >60 >60  GFRAA >60  --  >60 >60 >60 >60  ANIONGAP 12  --  8 9 8 9   < > = values in this interval not displayed.   Hematology  Recent Labs Lab 08/16/16 0342 08/17/16 0257 08/18/16 0151  WBC 18.9* 15.7* 13.9*  RBC 4.68 4.50 4.63  HGB 15.0 14.3 14.9  HCT 43.2 42.3 43.7  MCV 92.3 94.0 94.4  MCH 32.1 31.8 32.2  MCHC 34.7 33.8 34.1  RDW 13.1 13.2 13.0  PLT 204 176 193    Cardiac Enzymes  Recent Labs Lab 08/15/16 1523 08/15/16 2103 08/16/16 0342  TROPONINI 7.97* >65.00* 33.75*   No results for input(s): TROPIPOC in the last 168 hours.   BNP  Recent Labs Lab 08/15/16 2103  BNP 227.0*     DDimer No results for input(s): DDIMER in the last 168 hours.   Radiology    No results found.  Cardiac Studies   Echo: - Left ventricle: Mild distal septal and inferior wall hypokinesis.   The cavity size was normal. Wall thickness was increased in a   pattern of mild LVH. Systolic function was normal. The estimated   ejection fraction was in the range of 50% to 55%. Wall motion was   normal; there were no regional wall motion abnormalities. -  Atrial septum: No defect or patent foramen ovale was identified. - Impressions: GLS -14.9 lower limit normal 15.  Cardiac Cath: Conclusion    Acute inferior ST elevation myocardial infarction commencing approximately 18 hours prior to admission. Ongoing significant chest discomfort with ST elevation noted on EKG.  Total occlusion in the mid RCA of what is a dominant vessel that supplies the entire inferior wall and apical LV.  80-90% diffuse disease in the mid LAD which is a relatively small vessel.  Luminal irregularities noted in a large diagonal of the LAD.  Ostial to proximal 80% stenosis in the circumflex which is a relatively small vessel.  Irregularities within the ramus intermedius but no high-grade obstruction is noted. Less than 50% obstruction.  Successful PTCA and stent of the mid right coronary from 100% to 0% using an Onyx 4.5 x 22 mm drug-eluting stent postdilated with a 4.5 x 15 mm balloon. Catheter based thrombectomy was applied prior to stenting. TIMI grade 3 flow noted post procedure. Very distal PDA and left ventricular branch embolic obstruction.  The patient is allergic to aspirin.  Recommendations:  Brilinta times at least 6 months before switching to Plavix to complete at least one year, but I would recommend indefinite since the patient is unable to use aspirin.  IV Aggrastat 18 hours.  Beta blocker therapy.  High intensity statin therapy.  Consider staged intervention on the mid LAD diffuse disease. Team should review pictures to determine if this should be done prior to discharge.     Patient Profile     46 y.o. female presenting late with inferior MI, underwent emergent cath and PCI of the RCA.  Assessment & Plan    #1 Acute inferior STEMI: RCA occluded and was treated with DES x 1.  Due to ASA allergy, she is on Brilinta.  Echo with LVEF 50-55%.  Medical management for LAD disease.  Continue carvedilol and lisinopril.  Continue statin.  She  will follow up as outpatient for further medical management.  #2 HLD: on statin  #3 Type 2 DM: on SSI and increased Levemir 15units BID yesterday plus metformin.  CBGs are high and she is on prednisone for pyoderma gangrenosum.  She is eating 100% of her meals.    Signed, Gwynn Burly, DO  08/19/2016, 9:41 AM    I have personally seen and examined this patient with Dr. Earlene Plater. I agree with the assessment and plan as outlined above. She is doing well. No chest pain. Dyspnea improved. Continue Brilinta, Coreg, Lisinopril, statin. Discharge home today. Follow up in our office in 1-2 weeks with office APP and then long term with Dr. Katrinka Blazing.   Verne Carrow 08/19/2016 11:15 AM

## 2016-08-19 NOTE — Care Management Important Message (Signed)
Important Message  Patient Details  Name: Brittney Banks MRN: [id number] Date of Birth: Oct 28, 1970   Medicare Important Message Given:  Yes    Kyla Balzarine 08/19/2016, 12:28 PM

## 2016-08-19 NOTE — Care Management Note (Addendum)
Case Management Note  Patient Details  Name: DWAYNE LIERMAN MRN: [id number] Date of Birth: 1970/09/25  Subjective/Objective:    Pt presented for [facility]. Pt is from home with spouse. Plan to return home with [facility].                  Action/Plan: CM did provide pt with Agency List- pt chose [facility] for services. Referral made to Liaison [facility] and [facility] to begin within 24-48 hours. Pt had questions in regards to PCS- [facility] will be able to assist with Personal Care Services since pt has Medicaid. Referral for transportation as well. CM did make pt aware that Medicaid can assist with transportation as well. Staff RN aware of plan of care. Pt will need HH Orders and F2F. No further needs from CM at this time.   Expected Discharge Date:  08/19/16               Expected Discharge Plan:  Home w Home Health Services  In-House Referral:  NA  Discharge planning Services  CM Consult, Medication Assistance  Post Acute Care Choice:  Home Health Choice offered to:  Patient  DME Arranged:  N/A DME Agency:  NA  HH Arranged:  RN, Social Work [employer] Agency:  [facility]  Status of Service:  Completed, signed off  If discussed at [employer] of Stay Meetings, dates discussed:    Additional Comments:  Gala Lewandowsky, RN 08/19/2016, 2:05 PM

## 2016-08-19 NOTE — Progress Notes (Signed)
Inpatient Diabetes Program Recommendations  AACE/ADA: New Consensus Statement on Inpatient Glycemic Control (2015)  Target Ranges:  Prepandial:   less than 140 mg/dL      Peak postprandial:   less than 180 mg/dL (1-2 hours)      Critically ill patients:  140 - 180 mg/dL   Lab Results  Component Value Date   GLUCAP 166 (H) 08/19/2016   HGBA1C 10.8 (H) 08/15/2016    Review of Glycemic Control Results for Brittney Banks, Brittney Banks (MRN [id number]) as of 08/19/2016 10:31  Ref. Range 08/17/2016 21:41 08/18/2016 08:25 08/18/2016 12:39 08/18/2016 17:01 08/19/2016 05:52  Glucose-Capillary Latest Ref Range: 65 - 99 mg/dL 676 (H) 720 (H) 947 (H) 262 (H) 166 (H)   Inpatient Diabetes Program Recommendations:  Noted elevated post prandial CBGs. -Please consider Novolog 4 units tid Meal coverage if eats 50%  Thank you, Darel Hong E. Ngozi Alvidrez, RN, MSN, CDE  Diabetes Coordinator Inpatient Glycemic Control Team Team Pager [phone] (8am-5pm) 08/19/2016 10:31 AM

## 2016-08-19 NOTE — Discharge Summary (Signed)
Discharge Summary    Patient ID: Brittney Banks,  MRN: [id number], DOB/AGE: October 11, 1970 46 y.o.  Admit date: 08/15/2016 Discharge date: 08/19/2016   Primary Care Provider: No primary care provider on file. Primary Cardiologist: Dr. Tamala Julian  Discharge Diagnoses    Active Problems:   Diabetes mellitus type 2 in obese (HCC)   HTN (hypertension)   ST elevation myocardial infarction (STEMI) of inferior wall (HCC)   CAD in native artery   Acute ST elevation myocardial infarction (STEMI) of inferior wall (HCC)   Allergies Allergies  Allergen Reactions  . Anaprox [Naproxen Sodium] Shortness Of Breath    Redness of skin and difficulty breathing Redness of skin and difficulty breathing  . Aspirin Nausea And Vomiting    Severe cramping, vomiting Severe cramping, vomiting  . Ceftin [Cefuroxime Axetil] Shortness Of Breath    Skin turns red Skin turns red  . Celexa [Citalopram Hydrobromide] Shortness Of Breath    Breathing difficulty Breathing difficulty  . Ciprofloxacin Shortness Of Breath    Skin turns red Skin turns red  . Coconut Fragrance Hives, Shortness Of Breath and Swelling    Reaction to any kind of coconut Mouth and tongue swelling  . Contrast Media [Iodinated Diagnostic Agents] Shortness Of Breath    Breathing difficulty  . Cortisol [Hydrocortisone] Shortness Of Breath    Breathing difficulty and redness of skin Breathing difficulty and redness of skin  . Elavil [Amitriptyline] Shortness Of Breath    Redness of skin and blisters of skin Difficulty breathing Redness of skin and blisters of skin Difficulty breathing  . Hctz [Hydrochlorothiazide] Shortness Of Breath  . Imitrex [Sumatriptan] Shortness Of Breath    Redness and difficulty breathing Redness and difficulty breathing  . Lexapro [Escitalopram Oxalate] Shortness Of Breath    Breathing difficulty Redness to skin Breathing difficulty Redness to skin  . Methadone Nausea And Vomiting    Severely  vomiting Severely vomiting  . Metrizamide Shortness Of Breath    Breathing difficulty  . Motrin [Ibuprofen] Nausea And Vomiting  . Naprosyn [Naproxen] Shortness Of Breath    Breathing difficulty Redness of skin Breathing difficulty Redness of skin  . Neurontin [Gabapentin] Shortness Of Breath    Breathing difficulty  Redness to skin Breathing difficulty  Redness to skin  . Penicillins Shortness Of Breath    Redness to skin Has patient had a PCN reaction causing immediate rash, facial/tongue/throat swelling, SOB or lightheadedness with hypotension: Yes Has patient had a PCN reaction causing severe rash involving mucus membranes or skin necrosis: No Has patient had a PCN reaction that required hospitalization pt was in the hospital at time of reaction Has patient had a PCN reaction occurring within the last 10 years: No If all of the above answers are "NO", then may proceed with Cephalosporin use. Redness to skin Has patient had a PCN reaction causing immediate rash, facial/tongue/throat swelling, SOB or lightheadedness with hypotension: Yes Has patient had a PCN reaction causing severe rash involving mucus membranes or skin necrosis: No Has patient had a PCN reaction that required hospitalization pt was in the hospital at time of reaction Has patient had a PCN reaction occurring within the last 10 years: No If all of the above answers are "NO", then may proceed with Cephalosporin use.  . Sulfa Antibiotics Shortness Of Breath    Severe breathing difficulty And redness of skin  . Sulfasalazine Shortness Of Breath    Severe breathing difficulty And redness of skin  . Tagamet [Cimetidine]  Nausea And Vomiting    Severe vomiting Severe vomiting  . Toradol [Ketorolac Tromethamine] Shortness Of Breath    Redness to skin Redness to skin  . Vancomycin Rash    Patient Confirms Red mans syndome type rxn - Tolerated a slow infusion in past  . Zoloft [Sertraline Hcl] Shortness Of Breath     Breathing difficulty Redness to skin Breathing difficulty Redness to skin  . Iodine Swelling    Redness to skin Redness to skin     History of Present Illness     Brittney Banks is a 46 yo female with PMH of DM, HTN, cellulitis, and pyoderma gangrenosum. Reported she has never seen a cardiologist in the past. Remote hx of tobacco use. Does not know her family hx as she is adopted. Was in her usual state of health, until last evening around 11pm. States she was doing laundry when she developed left sided chest pain. She did not seek medical attention at that time. Pain was intermittent throughout the evening. This morning she went to [facility] in [place] and EKG there was concerning for inferior STEMI. EMS was called. She was given 2 SL nitro, no ASA as she has an allergy. Received IVFs.   In the ED, her EKG was reviewed and called an acute inferior STEMI. She was brought to the cath lab emergently for cardiac catheterization. Noted to be tachycardiac, with stable blood pressure on arrival.   Hospital Course     Consultants: None  She was taken to the cath lab and received a bare metal stent to her mid RCA. She had 80-90% diffuse disease in the mid LAD which is a relatively small vessel; this will be treated medically. She has an aspirin allergy and will discharge on a minimum of six months of brilinta. Follow up echocardiogram with LVEF of 50-55%. Post-cath chest pressure has resolved.   She was discharged on brillinta, coreg, and lisinopril. She was instructed to resume metformin 48 hrs after discharge.  Patient seen and examined by Dr. Clifton James today and was stable for discharge. All follow up has been arranged.  _____________  Discharge Vitals Blood pressure 124/86, pulse 74, temperature 98.1 F (36.7 C), temperature source Oral, resp. rate 18, height 5\' 9"  (1.753 m), weight (!) 300 lb 7.8 oz (136.3 kg), SpO2 100 %.  Filed Weights   08/15/16 1650 08/18/16 1100  Weight: (!) 301 lb 9.4  oz (136.8 kg) (!) 300 lb 7.8 oz (136.3 kg)    Labs & Radiologic Studies    CBC  Recent Labs  08/17/16 0257 08/18/16 0151  WBC 15.7* 13.9*  HGB 14.3 14.9  HCT 42.3 43.7  MCV 94.0 94.4  PLT 176 193   Basic Metabolic Panel  Recent Labs  08/17/16 0257 08/18/16 0151  NA 135 136  K 3.7 4.3  CL 103 100*  CO2 24 27  GLUCOSE 258* 236*  BUN 9 9  CREATININE 0.56 0.68  CALCIUM 8.7* 9.1   Liver Function Tests No results for input(s): AST, ALT, ALKPHOS, BILITOT, PROT, ALBUMIN in the last 72 hours. No results for input(s): LIPASE, AMYLASE in the last 72 hours. Cardiac Enzymes No results for input(s): CKTOTAL, CKMB, CKMBINDEX, TROPONINI in the last 72 hours. BNP Invalid input(s): POCBNP D-Dimer No results for input(s): DDIMER in the last 72 hours. Hemoglobin A1C No results for input(s): HGBA1C in the last 72 hours. Fasting Lipid Panel No results for input(s): CHOL, HDL, LDLCALC, TRIG, CHOLHDL, LDLDIRECT in the last 72 hours. Thyroid  Function Tests No results for input(s): TSH, T4TOTAL, T3FREE, THYROIDAB in the last 72 hours.  Invalid input(s): FREET3 _____________  No results found.   Diagnostic Studies/Procedures    Echo: - Left ventricle: Mild distal septal and inferior wall hypokinesis. The cavity size was normal. Wall thickness was increased in a pattern of mild LVH. Systolic function was normal. The estimated ejection fraction was in the range of 50% to 55%. Wall motion was normal; there were no regional wall motion abnormalities. - Atrial septum: No defect or patent foramen ovale was identified. - Impressions: GLS -14.9 lower limit normal 15.  Cardiac Cath: Conclusion    Acute inferior ST elevation myocardial infarction commencing approximately 18 hours prior to admission. Ongoing significant chest discomfort with ST elevation noted on EKG.  Total occlusion in the mid RCA of what is a dominant vessel that supplies the entire inferior wall and  apical LV.  80-90% diffuse disease in the mid LAD which is a relatively small vessel.  Luminal irregularities noted in a large diagonal of the LAD.  Ostial to proximal 80% stenosis in the circumflex which is a relatively small vessel.  Irregularities within the ramus intermedius but no high-grade obstruction is noted. Less than 50% obstruction.  Successful PTCA and stent of the mid right coronary from 100% to 0% using an Onyx 4.5 x 22 mm drug-eluting stent postdilated with a 4.5 x 15 mm balloon. Catheter based thrombectomy was applied prior to stenting. TIMI grade 3 flow noted post procedure. Very distal PDA and left ventricular branch embolic obstruction.  The patient is allergic to aspirin.  Recommendations:  Brilinta times at least 6 months before switching to Plavix to complete at least one year, but I would recommend indefinite since the patient is unable to use aspirin.  IV Aggrastat 18 hours.  Beta blocker therapy.  High intensity statin therapy.  Consider staged intervention on the mid LAD diffuse disease. Team should review pictures to determine if this should be done prior to discharge.       Disposition   Pt is being discharged home today in good condition.  Follow-up Plans & Appointments    Follow-up Information    Nada Boozer, NP Follow up on 08/30/2016.   Specialties:  Cardiology, Radiology Why:  2:30 pm TOC appt and follow up for STEMI/PCI Contact information: [address] [place] [address] [phone]          Discharge Instructions    Amb Referral to Cardiac Rehabilitation    Complete by:  As directed    Diagnosis:   Coronary Stents Comment - To [place] STEMI     Diet - low sodium heart healthy    Complete by:  As directed    Discharge instructions    Complete by:  As directed    No driving for 2 weeks. No lifting over 10 lbs for 4 weeks. No sexual activity for 4 weeks. You may not return to work until cleared by your  cardiologist. Keep procedure site clean & dry. If you notice increased pain, swelling, bleeding or pus, call/return!  You may shower, but no soaking baths/hot tubs/pools for 1 week.   Increase activity slowly    Complete by:  As directed       Discharge Medications   Current Discharge Medication List    START taking these medications   Details  atorvastatin (LIPITOR) 80 MG tablet Take 1 tablet (80 mg total) by mouth daily at 6 PM. Qty:  90 tablet, Refills: 3    carvedilol (COREG) 3.125 MG tablet Take 1 tablet (3.125 mg total) by mouth 2 (two) times daily with a meal. Qty: 180 tablet, Refills: 3    nitroGLYCERIN (NITROSTAT) 0.4 MG SL tablet Place 1 tablet (0.4 mg total) under the tongue every 5 (five) minutes x 3 doses as needed for chest pain. Qty: 25 tablet, Refills: 3    ticagrelor (BRILINTA) 90 MG TABS tablet Take 1 tablet (90 mg total) by mouth 2 (two) times daily. Qty: 180 tablet, Refills: 3      CONTINUE these medications which have CHANGED   Details  metFORMIN (GLUCOPHAGE) 850 MG tablet Take 1 tablet (850 mg total) by mouth 3 (three) times daily with meals. Resume 48 hr after discharge.      CONTINUE these medications which have NOT CHANGED   Details  cetirizine (ZYRTEC) 10 MG tablet Take 10 mg by mouth daily.    glucose monitoring kit (FREESTYLE) monitoring kit 1 each by Does not apply route as needed for other. Dispense any model that is covered- dispense testing supplies for Q AC/ HS accuchecks- 1 month supply with one refil. Qty: 1 each, Refills: 1    insulin aspart (NOVOLOG FLEXPEN) 100 UNIT/ML FlexPen Inject 3-20 Units into the skin 3 (three) times daily with meals. CBG 70 - 120: 0 units  CBG 121 - 150: 3 units  CBG 151 - 200: 4 units  CBG 201 - 250: 7 units  CBG 251 - 300: 11 units  CBG 301 - 350: 15 units  CBG 351 - 400: 20 units Qty: 15 mL, Refills: 11    insulin detemir (LEVEMIR) 100 UNIT/ML injection Inject 0.2 mLs (20 Units total) into the skin at  bedtime. Qty: 10 mL, Refills: 11    lisinopril (PRINIVIL,ZESTRIL) 10 MG tablet Take 1 tablet (10 mg total) by mouth daily. Qty: 30 tablet, Refills: 0    methotrexate (RHEUMATREX) 2.5 MG tablet Take 12.5 mg by mouth every Sunday. Caution:Chemotherapy. Protect from light.    morphine (MS CONTIN) 60 MG 12 hr tablet Limit 1 tablet by mouth every 8-12 hours if tolerated Qty: 90 tablet, Refills: 0    omeprazole (PRILOSEC OTC) 20 MG tablet Take 20 mg by mouth daily.    Oxycodone HCl 20 MG TABS Limited 1 tablet by mouth 3-6 times per day if tolerated Qty: 170 tablet, Refills: 0    predniSONE (DELTASONE) 10 MG tablet Take 10 mg by mouth 2 (two) times daily with a meal.     collagenase (SANTYL) ointment Apply topically daily. Qty: 15 g, Refills: 0         Aspirin prescribed at discharge?  No: allergy High Intensity Statin Prescribed? (Lipitor 40-'80mg'$  or Crestor 20-'40mg'$ ): Yes Beta Blocker Prescribed? Yes For EF <40%, was ACEI/ARB Prescribed? Yes ADP Receptor Inhibitor Prescribed? (i.e. Plavix etc.-Includes Medically Managed Patients): Yes For EF <40%, Aldosterone Inhibitor Prescribed? No: EF > 76 Was EF assessed during THIS hospitalization? Yes Was Cardiac Rehab II ordered? (Included Medically managed Patients): Yes   Outstanding Labs/Studies   None  Duration of Discharge Encounter   Greater than 30 minutes including physician time.  Signed, Tami Lin Duke PA-C 08/19/2016, 1:45 PM

## 2016-08-19 NOTE — Research (Signed)
I spoke with patient to let her know that she did not receive the genotype for Dal-Gene study.Patient will not be able to continue in the study. I thanked patient for her willingness to participate in the study.

## 2016-08-21 DIAGNOSIS — I25119 Atherosclerotic heart disease of native coronary artery with unspecified angina pectoris: Secondary | ICD-10-CM | POA: Diagnosis not present

## 2016-08-21 DIAGNOSIS — I2111 ST elevation (STEMI) myocardial infarction involving right coronary artery: Secondary | ICD-10-CM | POA: Diagnosis not present

## 2016-08-23 DIAGNOSIS — M47816 Spondylosis without myelopathy or radiculopathy, lumbar region: Secondary | ICD-10-CM | POA: Diagnosis not present

## 2016-08-23 DIAGNOSIS — M533 Sacrococcygeal disorders, not elsewhere classified: Secondary | ICD-10-CM | POA: Diagnosis not present

## 2016-08-23 DIAGNOSIS — M542 Cervicalgia: Secondary | ICD-10-CM | POA: Diagnosis not present

## 2016-08-23 DIAGNOSIS — G894 Chronic pain syndrome: Secondary | ICD-10-CM | POA: Diagnosis not present

## 2016-08-23 DIAGNOSIS — M47817 Spondylosis without myelopathy or radiculopathy, lumbosacral region: Secondary | ICD-10-CM | POA: Diagnosis not present

## 2016-08-23 DIAGNOSIS — M5137 Other intervertebral disc degeneration, lumbosacral region: Secondary | ICD-10-CM | POA: Diagnosis not present

## 2016-08-23 DIAGNOSIS — M5136 Other intervertebral disc degeneration, lumbar region: Secondary | ICD-10-CM | POA: Diagnosis not present

## 2016-08-23 DIAGNOSIS — M47812 Spondylosis without myelopathy or radiculopathy, cervical region: Secondary | ICD-10-CM | POA: Diagnosis not present

## 2016-08-23 DIAGNOSIS — Z79891 Long term (current) use of opiate analgesic: Secondary | ICD-10-CM | POA: Diagnosis not present

## 2016-08-23 DIAGNOSIS — Z8614 Personal history of Methicillin resistant Staphylococcus aureus infection: Secondary | ICD-10-CM | POA: Diagnosis not present

## 2016-08-23 DIAGNOSIS — M5416 Radiculopathy, lumbar region: Secondary | ICD-10-CM | POA: Diagnosis not present

## 2016-08-23 DIAGNOSIS — M4322 Fusion of spine, cervical region: Secondary | ICD-10-CM | POA: Diagnosis not present

## 2016-08-24 DIAGNOSIS — I25119 Atherosclerotic heart disease of native coronary artery with unspecified angina pectoris: Secondary | ICD-10-CM | POA: Diagnosis not present

## 2016-08-24 DIAGNOSIS — I2111 ST elevation (STEMI) myocardial infarction involving right coronary artery: Secondary | ICD-10-CM | POA: Diagnosis not present

## 2016-08-26 DIAGNOSIS — I2111 ST elevation (STEMI) myocardial infarction involving right coronary artery: Secondary | ICD-10-CM | POA: Diagnosis not present

## 2016-08-26 DIAGNOSIS — I25119 Atherosclerotic heart disease of native coronary artery with unspecified angina pectoris: Secondary | ICD-10-CM | POA: Diagnosis not present

## 2016-08-30 ENCOUNTER — Ambulatory Visit: Payer: Medicare Other | Admitting: Cardiology

## 2016-08-30 DIAGNOSIS — Z792 Long term (current) use of antibiotics: Secondary | ICD-10-CM | POA: Diagnosis not present

## 2016-08-30 DIAGNOSIS — Z79899 Other long term (current) drug therapy: Secondary | ICD-10-CM | POA: Diagnosis not present

## 2016-08-30 DIAGNOSIS — Z7952 Long term (current) use of systemic steroids: Secondary | ICD-10-CM | POA: Diagnosis not present

## 2016-08-30 DIAGNOSIS — T8189XD Other complications of procedures, not elsewhere classified, subsequent encounter: Secondary | ICD-10-CM | POA: Diagnosis not present

## 2016-08-30 DIAGNOSIS — Z981 Arthrodesis status: Secondary | ICD-10-CM | POA: Diagnosis not present

## 2016-08-30 DIAGNOSIS — Z5181 Encounter for therapeutic drug level monitoring: Secondary | ICD-10-CM | POA: Diagnosis not present

## 2016-08-30 DIAGNOSIS — B9562 Methicillin resistant Staphylococcus aureus infection as the cause of diseases classified elsewhere: Secondary | ICD-10-CM | POA: Diagnosis not present

## 2016-08-30 DIAGNOSIS — L0889 Other specified local infections of the skin and subcutaneous tissue: Secondary | ICD-10-CM | POA: Diagnosis not present

## 2016-08-30 DIAGNOSIS — L88 Pyoderma gangrenosum: Secondary | ICD-10-CM | POA: Diagnosis not present

## 2016-09-01 DIAGNOSIS — I2111 ST elevation (STEMI) myocardial infarction involving right coronary artery: Secondary | ICD-10-CM | POA: Diagnosis not present

## 2016-09-02 DIAGNOSIS — I2111 ST elevation (STEMI) myocardial infarction involving right coronary artery: Secondary | ICD-10-CM | POA: Diagnosis not present

## 2016-09-02 DIAGNOSIS — I25119 Atherosclerotic heart disease of native coronary artery with unspecified angina pectoris: Secondary | ICD-10-CM | POA: Diagnosis not present

## 2016-09-03 DIAGNOSIS — I2111 ST elevation (STEMI) myocardial infarction involving right coronary artery: Secondary | ICD-10-CM | POA: Diagnosis not present

## 2016-09-03 DIAGNOSIS — I25119 Atherosclerotic heart disease of native coronary artery with unspecified angina pectoris: Secondary | ICD-10-CM | POA: Diagnosis not present

## 2016-09-05 ENCOUNTER — Ambulatory Visit (INDEPENDENT_AMBULATORY_CARE_PROVIDER_SITE_OTHER): Payer: Medicare Other | Admitting: Cardiology

## 2016-09-05 ENCOUNTER — Encounter: Payer: Self-pay | Admitting: Cardiology

## 2016-09-05 VITALS — BP 118/84 | HR 84 | Ht 69.5 in | Wt 302.4 lb

## 2016-09-05 DIAGNOSIS — I2510 Atherosclerotic heart disease of native coronary artery without angina pectoris: Secondary | ICD-10-CM | POA: Diagnosis not present

## 2016-09-05 DIAGNOSIS — L88 Pyoderma gangrenosum: Secondary | ICD-10-CM | POA: Diagnosis not present

## 2016-09-05 DIAGNOSIS — E118 Type 2 diabetes mellitus with unspecified complications: Secondary | ICD-10-CM | POA: Diagnosis not present

## 2016-09-05 DIAGNOSIS — Z794 Long term (current) use of insulin: Secondary | ICD-10-CM

## 2016-09-05 DIAGNOSIS — R0602 Shortness of breath: Secondary | ICD-10-CM | POA: Diagnosis not present

## 2016-09-05 DIAGNOSIS — Z955 Presence of coronary angioplasty implant and graft: Secondary | ICD-10-CM | POA: Diagnosis not present

## 2016-09-05 DIAGNOSIS — I519 Heart disease, unspecified: Secondary | ICD-10-CM

## 2016-09-05 DIAGNOSIS — I10 Essential (primary) hypertension: Secondary | ICD-10-CM | POA: Diagnosis not present

## 2016-09-05 DIAGNOSIS — E782 Mixed hyperlipidemia: Secondary | ICD-10-CM

## 2016-09-05 DIAGNOSIS — I2119 ST elevation (STEMI) myocardial infarction involving other coronary artery of inferior wall: Secondary | ICD-10-CM | POA: Diagnosis not present

## 2016-09-05 DIAGNOSIS — L97409 Non-pressure chronic ulcer of unspecified heel and midfoot with unspecified severity: Secondary | ICD-10-CM | POA: Diagnosis not present

## 2016-09-05 MED ORDER — ISOSORBIDE MONONITRATE ER 30 MG PO TB24: 30.0000 mg | ORAL_TABLET | Freq: Every day | ORAL | 5 refills | Status: DC

## 2016-09-05 MED ORDER — ROSUVASTATIN CALCIUM 20 MG PO TABS
20.0000 mg | ORAL_TABLET | Freq: Every day | ORAL | 5 refills | Status: DC
Start: 2016-09-05 — End: 2017-02-10

## 2016-09-05 MED ORDER — CLOPIDOGREL BISULFATE 75 MG PO TABS: 75.0000 mg | ORAL_TABLET | Freq: Every day | ORAL | 5 refills | Status: DC

## 2016-09-05 NOTE — Progress Notes (Signed)
Cardiology Office Note   Date:  09/05/2016   ID:  Brittney Banks, DOB 09/11/1970, MRN [id number]  PCP:  Ronita Hipps, MD  Cardiologist:    Dr. Tamala Julian  Chief Complaint  Patient presents with  . Hospitalization Follow-up    post STEMI      History of Present Illness: Brittney Banks is a 46 y.o. female who presents for post hospitalization 08/15/16 to 08/19/16 for left sided chest pain.  Intermittent through the night and continued morning of admit, she went to [facility] and EKG with inf. STEMI.  She had 2 SL NTG but no asa due to allergy (N&V).  She went emergently to cath lab at [facility].  In Lab she had 100% RCA stenosis and had PCI with Onyx DES and catheter based thrombectomy prior to stenting.  She does have other disease.   80-90% diffuse disease in the mid LAD which is a relatively small vessel.  Luminal irregularities noted in a large diagonal of the LAD.  Ostial to proximal 80% stenosis in the circumflex which is a relatively small vessel.  Irregularities within the ramus intermedius but no high-grade obstruction is noted. Less than 50% obstruction.  Thought was possible PCI to LAD.  This vessel is small and would be difficult.   Echo with EF 50-55%,   She was placed on Brilinta no ASA due to allergy.  She did have some mild chest pain prior to discharge, that resolved. Since she has been home she has twinges in her chest though they do no last long.  This occurs with exertion.   Does not occur every day.  Has not had to take NTG.  She also has SOB that occ occurs with chest pain but usually without.  It has awakened her from sleep.  She also has edema at times.  Yesterday worse, she has stopped most salt, and she wrapped her legs with improvement.    With the lipitor she had vomiting now diarrhea.  Also with significant muscle pain.   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  She has pyoderma gangrenosum and is on  steroids.  she has ulcers on her legs and arms and now on her face.  Home health comes to help with these wounds.  She is followed by Payton Mccallum at [facility].       Past Medical History:  Diagnosis Date  . Allergy   . Behcet's disease [facility])    patient advised RN she has Behcet's disease  . Cellulitis 03/2016  . DDD (degenerative disc disease), lumbar   . Diabetes mellitus without complication ([place])    type 2  . Hypertension   . MRSA carrier 2008  . MRSA infection 2008   BOTH LEGS    Past Surgical History:  Procedure Laterality Date  . BILATERAL CARPAL TUNNEL RELEASE  2007  . CORONARY STENT INTERVENTION N/A 08/15/2016   Procedure: Coronary Stent Intervention;  Surgeon: Belva Crome, MD;  Location: [place] CV LAB;  Service: Cardiovascular;  Laterality: N/A;  . ECTOPIC PREGNANCY SURGERY     2 SURGERIES  . LEFT HEART CATH AND CORONARY ANGIOGRAPHY N/A 08/15/2016   Procedure: Left Heart Cath and Coronary Angiography;  Surgeon: Belva Crome, MD;  Location: [place] CV LAB;  Service: Cardiovascular;  Laterality: N/A;  . NECK SURGERY N/A 2008  . REPLACEMENT TOTAL KNEE BILATERAL Right   .  TONSILLECTOMY       Current Outpatient Prescriptions  Medication Sig Dispense Refill  . atorvastatin (LIPITOR) 80 MG tablet Take 1 tablet (80 mg total) by mouth daily at 6 PM. 90 tablet 3  . carvedilol (COREG) 3.125 MG tablet Take 1 tablet (3.125 mg total) by mouth 2 (two) times daily with a meal. 180 tablet 3  . cetirizine (ZYRTEC) 10 MG tablet Take 10 mg by mouth daily.    . collagenase (SANTYL) ointment Apply topically daily. 15 g 0  . glucose monitoring kit (FREESTYLE) monitoring kit 1 each by Does not apply route as needed for other. Dispense any model that is covered- dispense testing supplies for Q AC/ HS accuchecks- 1 month supply with one refil. 1 each 1  . insulin aspart (NOVOLOG FLEXPEN) 100 UNIT/ML FlexPen Inject 3-20 Units into the skin 3 (three) times daily with meals. CBG 70 - 120: 0  units  CBG 121 - 150: 3 units  CBG 151 - 200: 4 units  CBG 201 - 250: 7 units  CBG 251 - 300: 11 units  CBG 301 - 350: 15 units  CBG 351 - 400: 20 units 15 mL 11  . insulin detemir (LEVEMIR) 100 UNIT/ML injection Inject 0.2 mLs (20 Units total) into the skin at bedtime. 10 mL 11  . lisinopril (PRINIVIL,ZESTRIL) 10 MG tablet Take 1 tablet (10 mg total) by mouth daily. 30 tablet 0  . metFORMIN (GLUCOPHAGE) 850 MG tablet Take 1 tablet (850 mg total) by mouth 3 (three) times daily with meals. Resume 48 hr after discharge.    . methotrexate (RHEUMATREX) 2.5 MG tablet Take 12.5 mg by mouth every Sunday. Caution:Chemotherapy. Protect from light.    . morphine (MS CONTIN) 60 MG 12 hr tablet Take 60 mg by mouth every 8 (eight) hours as needed for pain.    . nitroGLYCERIN (NITROSTAT) 0.4 MG SL tablet Place 1 tablet (0.4 mg total) under the tongue every 5 (five) minutes x 3 doses as needed for chest pain. 25 tablet 3  . omeprazole (PRILOSEC OTC) 20 MG tablet Take 20 mg by mouth daily.    . Oxycodone HCl 20 MG TABS Take 20 mg by mouth every 4 (four) hours as needed (pain).    . predniSONE (DELTASONE) 10 MG tablet Take 10 mg by mouth 2 (two) times daily with a meal.     . ticagrelor (BRILINTA) 90 MG TABS tablet Take 1 tablet (90 mg total) by mouth 2 (two) times daily. 180 tablet 3   No current facility-administered medications for this visit.     Allergies:   Anaprox [naproxen sodium]; Aspirin; Ceftin [cefuroxime axetil]; Celexa [citalopram hydrobromide]; Ciprofloxacin; Coconut fragrance; Contrast media [iodinated diagnostic agents]; Cortisol [hydrocortisone]; Elavil [amitriptyline]; Hctz [hydrochlorothiazide]; Imitrex [sumatriptan]; Lexapro [escitalopram oxalate]; Methadone; Metrizamide; Motrin [ibuprofen]; Naprosyn [naproxen]; Neurontin [gabapentin]; Penicillins; Sulfa antibiotics; Sulfasalazine; Tagamet [cimetidine]; Toradol [ketorolac tromethamine]; Vancomycin; Zoloft [sertraline hcl]; and Iodine     Social History:  The patient  reports that she quit smoking about 6 years ago. She has never used smokeless tobacco. She reports that she does not drink alcohol or use drugs.   Family History:  The patient's She was adopted. Family history is unknown by patient.    ROS:  General:no colds or fevers, no weight changes Skin:no rashes or ulcers HEENT:no blurred vision, no congestion CV:see HPI PUL:see HPI GI:no diarrhea constipation or melena, no indigestion GU:no hematuria, no dysuria MS:no joint pain, no claudication Neuro:no syncope, no lightheadedness Endo:+insulin dependent diabetes with  glucose 120 to 160 at home. no thyroid disease   Wt Readings from Last 3 Encounters:  09/05/16 (!) 302 lb 6.4 oz (137.2 kg)  08/18/16 (!) 300 lb 7.8 oz (136.3 kg)  04/28/16 287 lb (130.2 kg)     PHYSICAL EXAM: VS:  BP 118/84   Pulse 84   Ht 5' 9.5" (1.765 m)   Wt (!) 302 lb 6.4 oz (137.2 kg)   LMP  (LMP Unknown)   BMI 44.02 kg/m  , BMI Body mass index is 44.02 kg/m. General:Pleasant affect, NAD Skin:Warm and dry, brisk capillary refill, ulcer on lt facial cheek, legs and arms HEENT:normocephalic, sclera clear, mucus membranes moist Neck:supple, no JVD, no bruits  Heart:S1S2 RRR without murmur, gallup, rub or click Lungs:clear without rales, rhonchi, or wheezes ENI:DPOEU, soft, non tender, + BS, do not palpate liver spleen or masses Ext:tr lower ext edema, 2+ pedal pulses, 2+ radial pulses Neuro:alert and oriented X 3, MAE, follows commands, + facial symmetry    EKG:  EKG is ordered today. The ekg ordered today demonstrates SR inf. MI with T wave inversion in II,III, AVF improved from 08/17/16   Recent Labs: 08/15/2016: ALT 55; B Natriuretic Peptide 227.0; TSH 0.504 08/18/2016: BUN 9; Creatinine, Ser 0.68; Hemoglobin 14.9; Platelets 193; Potassium 4.3; Sodium 136    Lipid Panel    Component Value Date/Time   CHOL 200 08/16/2016 0342   TRIG 122 08/16/2016 0342   HDL 29 (L)  08/16/2016 0342   CHOLHDL 6.9 08/16/2016 0342   VLDL 24 08/16/2016 0342   LDLCALC 147 (H) 08/16/2016 0342       Other studies Reviewed: Additional studies/ records that were reviewed today include: . Cardiac cath and PCI: Procedures   Coronary Stent Intervention  Left Heart Cath and Coronary Angiography  Conclusion    Acute inferior ST elevation myocardial infarction commencing approximately 18 hours prior to admission. Ongoing significant chest discomfort with ST elevation noted on EKG.  Total occlusion in the mid RCA of what is a dominant vessel that supplies the entire inferior wall and apical LV.  80-90% diffuse disease in the mid LAD which is a relatively small vessel.  Luminal irregularities noted in a large diagonal of the LAD.  Ostial to proximal 80% stenosis in the circumflex which is a relatively small vessel.  Irregularities within the ramus intermedius but no high-grade obstruction is noted. Less than 50% obstruction.  Successful PTCA and stent of the mid right coronary from 100% to 0% using an Onyx 4.5 x 22 mm drug-eluting stent postdilated with a 4.5 x 15 mm balloon. Catheter based thrombectomy was applied prior to stenting. TIMI grade 3 flow noted post procedure. Very distal PDA and left ventricular branch embolic obstruction.  The patient is allergic to aspirin.  Recommendations:  Brilinta times at least 6 months before switching to Plavix to complete at least one year, but I would recommend indefinite since the patient is unable to use aspirin.  IV Aggrastat 18 hours.  Beta blocker therapy.  High intensity statin therapy.  Consider staged intervention on the mid LAD diffuse disease. Team should review pictures to determine if this should be done prior to discharge.   ECHO 08/17/16  Study Conclusions  - Left ventricle: Mild distal septal and inferior wall hypokinesis.   The cavity size was normal. Wall thickness was increased in a   pattern of mild  LVH. Systolic function was normal. The estimated   ejection fraction was in the range of 50%  to 55%. Wall motion was   normal; there were no regional wall motion abnormalities. - Atrial septum: No defect or patent foramen ovale was identified. - Impressions: GLS -14.9 lower limit normal 15.  Impressions:  - GLS -14.9 lower limit normal 15.   ASSESSMENT AND PLAN:  1.  S/p inf wall STEMI  With DES to RCA for 100% occlusion on brilinta, allergy to asa she is on BB and ACE.  Troponin was > 65.00. She does have NTG  2. CAD residual with continued pain I discussed with Dr. Tamala Julian in the office and he reviewed films- the LAD is also a small vessel, will try medical management first.  With pain will add imdur 30 mg po daily.  If this helps we could do higher dose and eventually add ranexa if needed.   I will see her back in 2 weeks, and Dr. Tamala Julian in 8-10 weeks.  3. SOB most likely due to Brilinta.  She will take dose tonight then stop.  Tomorrow she will take 300 mg of plavix then 75 mg per thereafter.  She will call if not improvement in SOB.  I do not see CXR we will have her have one.  4. HLD goal LDL is < 70.  She is having issues with lipitor with N&V, diarrhea. She will stop and instead take crestor 20 mg.  Will recheck hepatic and lipid pane in 6 weeks.   5. LV dysfunction with EF 50-55% on ACE.  6. DM per PCP  7. Skin ulcers related to pyoderma gangrenosa.    Current medicines are reviewed with the patient today.  The patient Has no concerns regarding medicines.  The following changes have been made:  See above Labs/ tests ordered today include:see above  Disposition:   FU:  see above  Signed, Cecilie Kicks, NP  09/05/2016 3:49 PM    [facility] [address] [place], [place], [place] Phone: [phone]; Fax: [phone]

## 2016-09-05 NOTE — Patient Instructions (Addendum)
Medication Instructions:  Your physician has recommended you make the following change in your medication:  1) STOP Lipitor 2) START Crestor 20mg  daily 3) STOP Brilinta. Take your last dose tonight 4) START Plavix 75mg  daily. Take 300mg  (4 tablets) of Plavix tomorrow morning, THEN take 75mg  daily starting the next day 5) START Imdur 30mg  daily  Labwork: Your physician recommends that you return for a FASTING lipid profile and cmet in 6 weeks    Testing/Procedures:  None ordered  Follow-Up: Your physician recommends that you schedule a follow-up appointment in: 2 weeks with Nada Boozer, NP   Any Other Special Instructions Will Be Listed Below (If Applicable). Call the office if you develop chest pain or shortness of breath    If you need a refill on your cardiac medications before your next appointment, please call your pharmacy.

## 2016-09-06 ENCOUNTER — Telehealth: Payer: Self-pay | Admitting: *Deleted

## 2016-09-06 NOTE — Telephone Encounter (Signed)
-----   Message from Leone Brand, NP sent at 09/05/2016  5:12 PM EDT ----- Please have pt get 2 view cXR for SOB, she did not have in the hospital.  The XRay.  Thanks.

## 2016-09-06 NOTE — Addendum Note (Signed)
Addended by: Burnetta Sabin on: 09/06/2016 07:26 AM   Modules accepted: Orders

## 2016-09-06 NOTE — Telephone Encounter (Signed)
Pt returned my call and she has been made aware that Nada Boozer, NP, would like for her to run by [facility] and get a 2 View CXR, since she didn't have one in the hospital and she is having some SOB.  Pt agreeable with the plan and states that she will go by today or tomorrow and get that done.

## 2016-09-07 DIAGNOSIS — I2111 ST elevation (STEMI) myocardial infarction involving right coronary artery: Secondary | ICD-10-CM | POA: Diagnosis not present

## 2016-09-07 DIAGNOSIS — I25119 Atherosclerotic heart disease of native coronary artery with unspecified angina pectoris: Secondary | ICD-10-CM | POA: Diagnosis not present

## 2016-09-08 ENCOUNTER — Encounter: Payer: Self-pay | Admitting: Cardiology

## 2016-09-14 ENCOUNTER — Ambulatory Visit
Admission: RE | Admit: 2016-09-14 | Discharge: 2016-09-14 | Disposition: A | Discharge status: Home / Self Care | Payer: Medicare Other | Source: Ambulatory Visit | Attending: Cardiology | Admitting: Cardiology

## 2016-09-14 DIAGNOSIS — R0602 Shortness of breath: Secondary | ICD-10-CM | POA: Diagnosis not present

## 2016-09-14 DIAGNOSIS — R079 Chest pain, unspecified: Secondary | ICD-10-CM | POA: Diagnosis not present

## 2016-09-15 ENCOUNTER — Telehealth: Payer: Self-pay | Admitting: Cardiology

## 2016-09-15 DIAGNOSIS — I25119 Atherosclerotic heart disease of native coronary artery with unspecified angina pectoris: Secondary | ICD-10-CM | POA: Diagnosis not present

## 2016-09-15 DIAGNOSIS — I2111 ST elevation (STEMI) myocardial infarction involving right coronary artery: Secondary | ICD-10-CM | POA: Diagnosis not present

## 2016-09-15 NOTE — Telephone Encounter (Signed)
New message      Pt c/o BP issue: STAT if pt c/o blurred vision, one-sided weakness or slurred speech  1. What are your last 5 BP readings?  165/94 HR 98  2. Are you having any other symptoms (ex. Dizziness, headache, blurred vision, passed out)?  Headache for 3 days 3. What is your BP issue? Calling to report bp reading.  Please call pt at [phone]

## 2016-09-16 MED ORDER — AZITHROMYCIN 500 MG PO TABS: 500.0000 mg | ORAL_TABLET | Freq: Every day | ORAL | 0 refills | Status: DC

## 2016-09-16 MED ORDER — LINEZOLID 600 MG PO TABS: 600.0000 mg | ORAL_TABLET | Freq: Two times a day (BID) | ORAL | 0 refills | Status: DC

## 2016-09-16 MED ORDER — LISINOPRIL 20 MG PO TABS: 20.0000 mg | ORAL_TABLET | Freq: Every day | ORAL | 3 refills | Status: DC

## 2016-09-16 MED ORDER — CARVEDILOL 6.25 MG PO TABS: 6.2500 mg | ORAL_TABLET | Freq: Two times a day (BID) | ORAL | 3 refills | Status: DC

## 2016-09-16 NOTE — Telephone Encounter (Signed)
-----   Message from Leone Brand, NP sent at 09/15/2016  5:05 PM EDT ----- Pt with infiltrate on CXR, concern for PNA.  How is her SOB?  Will treat with  linezolid 600mg  BID  # 14 no refills and azithromycin 500mg  daily, # 7 no refills. both for 7 days duration.  Thanks.  ----- Message ----- From: Awilda Metro, [facility] Sent: 09/15/2016   3:31 PM To: Leone Brand, NP  Pt is allergic to sulfa abx, PCNs, cephalosporins, and fluoroquinolones. She was in the hospital within the last month for 5 days, therefore should cover MDR pathogens like MRSA. However, unable to cover for pseudomonas in the outpatient setting given her allergies. Would recommend starting linezolid 600mg  BID and azithromycin 500mg  daily, both for 7 days duration.

## 2016-09-16 NOTE — Telephone Encounter (Signed)
Increase coreg to 6.25 BID and lisinopril to 20 mg daily.  What is her HR.?  This should bring BP down.

## 2016-09-16 NOTE — Telephone Encounter (Signed)
Returned pts call re: bp readings. Per Nada Boozer, increased the Coreg to 6.25 bid and Lisinoprol 20 mg daily. Pt hr at the time of the bp was 98. Pt has been made aware of the medication change and has been advised to monitor her bp for the next 5-7 days and keep a log and to call us with that next week.  Pt agreeable with this and verbalized appreciation.

## 2016-09-20 DIAGNOSIS — M5137 Other intervertebral disc degeneration, lumbosacral region: Secondary | ICD-10-CM | POA: Diagnosis not present

## 2016-09-20 DIAGNOSIS — M47816 Spondylosis without myelopathy or radiculopathy, lumbar region: Secondary | ICD-10-CM | POA: Diagnosis not present

## 2016-09-20 DIAGNOSIS — M47812 Spondylosis without myelopathy or radiculopathy, cervical region: Secondary | ICD-10-CM | POA: Diagnosis not present

## 2016-09-20 DIAGNOSIS — M5136 Other intervertebral disc degeneration, lumbar region: Secondary | ICD-10-CM | POA: Diagnosis not present

## 2016-09-20 DIAGNOSIS — M5416 Radiculopathy, lumbar region: Secondary | ICD-10-CM | POA: Diagnosis not present

## 2016-09-20 DIAGNOSIS — M533 Sacrococcygeal disorders, not elsewhere classified: Secondary | ICD-10-CM | POA: Diagnosis not present

## 2016-09-20 DIAGNOSIS — Z79891 Long term (current) use of opiate analgesic: Secondary | ICD-10-CM | POA: Diagnosis not present

## 2016-09-20 DIAGNOSIS — Z8614 Personal history of Methicillin resistant Staphylococcus aureus infection: Secondary | ICD-10-CM | POA: Diagnosis not present

## 2016-09-20 DIAGNOSIS — M4322 Fusion of spine, cervical region: Secondary | ICD-10-CM | POA: Diagnosis not present

## 2016-09-20 DIAGNOSIS — M542 Cervicalgia: Secondary | ICD-10-CM | POA: Diagnosis not present

## 2016-09-20 DIAGNOSIS — M47817 Spondylosis without myelopathy or radiculopathy, lumbosacral region: Secondary | ICD-10-CM | POA: Diagnosis not present

## 2016-09-20 DIAGNOSIS — G894 Chronic pain syndrome: Secondary | ICD-10-CM | POA: Diagnosis not present

## 2016-09-22 DIAGNOSIS — I25119 Atherosclerotic heart disease of native coronary artery with unspecified angina pectoris: Secondary | ICD-10-CM | POA: Diagnosis not present

## 2016-09-22 DIAGNOSIS — I2111 ST elevation (STEMI) myocardial infarction involving right coronary artery: Secondary | ICD-10-CM | POA: Diagnosis not present

## 2016-09-27 ENCOUNTER — Encounter: Payer: Self-pay | Admitting: Cardiology

## 2016-09-27 ENCOUNTER — Ambulatory Visit (INDEPENDENT_AMBULATORY_CARE_PROVIDER_SITE_OTHER): Payer: Medicare Other | Admitting: Cardiology

## 2016-09-27 VITALS — BP 108/70 | HR 100 | Ht 69.5 in | Wt 298.4 lb

## 2016-09-27 DIAGNOSIS — I2119 ST elevation (STEMI) myocardial infarction involving other coronary artery of inferior wall: Secondary | ICD-10-CM

## 2016-09-27 DIAGNOSIS — I2510 Atherosclerotic heart disease of native coronary artery without angina pectoris: Secondary | ICD-10-CM | POA: Diagnosis not present

## 2016-09-27 DIAGNOSIS — I519 Heart disease, unspecified: Secondary | ICD-10-CM

## 2016-09-27 DIAGNOSIS — Z794 Long term (current) use of insulin: Secondary | ICD-10-CM | POA: Diagnosis not present

## 2016-09-27 DIAGNOSIS — E782 Mixed hyperlipidemia: Secondary | ICD-10-CM | POA: Diagnosis not present

## 2016-09-27 DIAGNOSIS — Z955 Presence of coronary angioplasty implant and graft: Secondary | ICD-10-CM | POA: Diagnosis not present

## 2016-09-27 DIAGNOSIS — E118 Type 2 diabetes mellitus with unspecified complications: Secondary | ICD-10-CM | POA: Diagnosis not present

## 2016-09-27 DIAGNOSIS — R509 Fever, unspecified: Secondary | ICD-10-CM

## 2016-09-27 MED ORDER — ISOSORBIDE MONONITRATE ER 30 MG PO TB24: 30.0000 mg | ORAL_TABLET | Freq: Two times a day (BID) | ORAL | 3 refills | Status: DC

## 2016-09-27 NOTE — Progress Notes (Signed)
Cardiology Office Note   Date:  09/27/2016   ID:  Brittney Banks, DOB [birth year], MRN [id number]  PCP:  Ronita Hipps, MD  Cardiologist:  Dr. Tamala Julian     Chief Complaint  Patient presents with  . Coronary Artery Disease    STEMI, with stent  . Shortness of Breath    PNA      History of Present Illness: Brittney Banks is a 46 y.o. female who presents for CAD with recent STEMI.  She had emergent cath and found to have 100% RCA stenosis and had PCI with ONYX, DES and catheter based thrombectomy prior to stenting. Does have residual CAD. CAD residual with continued pain I discussed with Dr. Tamala Julian in the office and he reviewed films- the LAD is also a small vessel, will try medical management first.  With pain I added imdur 30 mg po daily.  This has helped has not needed NTG sl since.   do higher dose and eventually add ranexa if needed.   I will see her back in 2 weeks, and Dr. Tamala Julian in 8-10 weeks.    She did not tolerate Brilinta so changed to plavix and the SOB improved.   She has pyoderma gangrenosum and is on steroids  CXR with infiltrate and we added abx.  Zithromax and Zyvox. Today she has competed abx but now with fever.  Up to 101.5 last pm.   N, V &. Diarrhea on lipitor 80 we changed to Crestor 20 mg.  And no further N,V or diarrhea.    Today with her chest pain the imdur has helped no longer needing SL NTG , when she walks and she is 30 min per day, she is developing som twinges and DOE.  With rest all goes away.  She always props up at night to sleep.   Overall she is better except now with cough and fever.     Past Medical History:  Diagnosis Date  . Allergy   . Behcet's disease [facility])    patient advised RN she has Behcet's disease  . Cellulitis 03/2016  . Coronary artery disease   . DDD (degenerative disc disease), lumbar   . Diabetes mellitus without complication ([place])    type 2  . Hyperlipidemia   . Hypertension   . LV dysfunction   . MRSA carrier 2008  . MRSA  infection 2008   BOTH LEGS  . Myocardial infarction involving right coronary artery [facility])     Past Surgical History:  Procedure Laterality Date  . BILATERAL CARPAL TUNNEL RELEASE  2007  . CORONARY STENT INTERVENTION N/A 08/15/2016   Procedure: Coronary Stent Intervention;  Surgeon: Belva Crome, MD;  Location: [facility];  Service: Cardiovascular;  Laterality: N/A;  . ECTOPIC PREGNANCY SURGERY     2 SURGERIES  . LEFT HEART CATH AND CORONARY ANGIOGRAPHY N/A 08/15/2016   Procedure: Left Heart Cath and Coronary Angiography;  Surgeon: Belva Crome, MD;  Location: [place];  Service: Cardiovascular;  Laterality: N/A;  . NECK SURGERY N/A 2008  . REPLACEMENT TOTAL KNEE BILATERAL Right   . TONSILLECTOMY       Current Outpatient Prescriptions  Medication Sig Dispense Refill  . carvedilol (COREG) 6.25 MG tablet Take 1 tablet (6.25 mg total) by mouth 2 (two) times daily. 180 tablet 3  . cetirizine (ZYRTEC) 10 MG tablet Take 10 mg by mouth daily.    . clopidogrel (PLAVIX) 75 MG tablet Take 1 tablet (  75 mg total) by mouth daily. 35 tablet 5  . collagenase (SANTYL) ointment Apply topically daily. 15 g 0  . glucose monitoring kit (FREESTYLE) monitoring kit 1 each by Does not apply route as needed for other. Dispense any model that is covered- dispense testing supplies for Q AC/ HS accuchecks- 1 month supply with one refil. 1 each 1  . insulin aspart (NOVOLOG FLEXPEN) 100 UNIT/ML FlexPen Inject 3-20 Units into the skin 3 (three) times daily with meals. CBG 70 - 120: 0 units  CBG 121 - 150: 3 units  CBG 151 - 200: 4 units  CBG 201 - 250: 7 units  CBG 251 - 300: 11 units  CBG 301 - 350: 15 units  CBG 351 - 400: 20 units 15 mL 11  . insulin detemir (LEVEMIR) 100 UNIT/ML injection Inject 0.2 mLs (20 Units total) into the skin at bedtime. 10 mL 11  . isosorbide mononitrate (IMDUR) 30 MG 24 hr tablet Take 1 tablet (30 mg total) by mouth 2 (two) times daily. 60 tablet 3  . lisinopril  (PRINIVIL,ZESTRIL) 20 MG tablet Take 1 tablet (20 mg total) by mouth daily. 90 tablet 3  . metFORMIN (GLUCOPHAGE) 850 MG tablet Take 1 tablet (850 mg total) by mouth 3 (three) times daily with meals. Resume 48 hr after discharge.    . methotrexate (RHEUMATREX) 2.5 MG tablet Take 12.5 mg by mouth every Sunday. Caution:Chemotherapy. Protect from light.    . morphine (MS CONTIN) 60 MG 12 hr tablet Take 60 mg by mouth every 8 (eight) hours as needed for pain.    . nitroGLYCERIN (NITROSTAT) 0.4 MG SL tablet Place 1 tablet (0.4 mg total) under the tongue every 5 (five) minutes x 3 doses as needed for chest pain. 25 tablet 3  . omeprazole (PRILOSEC OTC) 20 MG tablet Take 20 mg by mouth daily.    . Oxycodone HCl 20 MG TABS Take 20 mg by mouth every 4 (four) hours as needed (pain).    . predniSONE (DELTASONE) 10 MG tablet Take 10 mg by mouth 2 (two) times daily with a meal.     . rosuvastatin (CRESTOR) 20 MG tablet Take 1 tablet (20 mg total) by mouth daily. 30 tablet 5   No current facility-administered medications for this visit.     Allergies:   Anaprox [naproxen sodium]; Aspirin; Ceftin [cefuroxime axetil]; Celexa [citalopram hydrobromide]; Ciprofloxacin; Coconut fragrance; Contrast media [iodinated diagnostic agents]; Cortisol [hydrocortisone]; Elavil [amitriptyline]; Hctz [hydrochlorothiazide]; Imitrex [sumatriptan]; Lexapro [escitalopram oxalate]; Methadone; Metrizamide; Motrin [ibuprofen]; Naprosyn [naproxen]; Neurontin [gabapentin]; Penicillins; Sulfa antibiotics; Sulfasalazine; Tagamet [cimetidine]; Toradol [ketorolac tromethamine]; Vancomycin; Zoloft [sertraline hcl]; and Iodine    Social History:  The patient  reports that she quit smoking about 6 years ago. She has never used smokeless tobacco. She reports that she does not drink alcohol or use drugs.   Family History:  The patient's She was adopted. Family history is unknown by patient.    ROS:  General:no colds or fevers, + weight down 4  lbs.  Skin:no rashes + ulcers which come and go. HEENT:no blurred vision, no congestion CV:see HPI PUL:see HPI GI:no diarrhea constipation or melena, no indigestion GU:no hematuria, no dysuria MS:no joint pain, no claudication Neuro:no syncope, no lightheadedness Endo:+ diabetes has improved, no thyroid disease  Wt Readings from Last 3 Encounters:  09/27/16 298 lb 6.4 oz (135.4 kg)  09/05/16 (!) 302 lb 6.4 oz (137.2 kg)  08/18/16 (!) 300 lb 7.8 oz (136.3 kg)  PHYSICAL EXAM: VS:  BP 108/70   Pulse 100   Ht 5' 9.5" (1.765 m)   Wt 298 lb 6.4 oz (135.4 kg)   LMP  (LMP Unknown)   SpO2 97%   BMI 43.43 kg/m  , BMI Body mass index is 43.43 kg/m.  Temp 98.5 orally  General:Pleasant affect though mildly flat, NAD Skin:Warm and dry, brisk capillary refill, flushed now, + ulcers on arms and legs.   HEENT:normocephalic, sclera clear, mucus membranes moist Neck:supple, no JVD, no bruits  Heart:S1S2 RRR without murmur, gallup, rub or click Lungs:clear without rales, rhonchi, or wheezes VZD:GLOVF, soft, non tender, + BS, do not palpate liver spleen or masses Ext:no lower ext edema, 2+ pedal pulses, 2+ radial pulses Neuro:alert and oriented X 3, MAE, follows commands, + facial symmetry    EKG:  EKG is NOT ordered today.    Recent Labs: 08/15/2016: ALT 55; B Natriuretic Peptide 227.0; TSH 0.504 08/18/2016: BUN 9; Creatinine, Ser 0.68; Hemoglobin 14.9; Platelets 193; Potassium 4.3; Sodium 136    Lipid Panel    Component Value Date/Time   CHOL 200 08/16/2016 0342   TRIG 122 08/16/2016 0342   HDL 29 (L) 08/16/2016 0342   CHOLHDL 6.9 08/16/2016 0342   VLDL 24 08/16/2016 0342   LDLCALC 147 (H) 08/16/2016 0342       Other studies Reviewed: Additional studies/ records that were reviewed today include: . ECHO 08/17/16  Study Conclusions  - Left ventricle: Mild distal septal and inferior wall hypokinesis.   The cavity size was normal. Wall thickness was increased in a    pattern of mild LVH. Systolic function was normal. The estimated   ejection fraction was in the range of 50% to 55%. Wall motion was   normal; there were no regional wall motion abnormalities. - Atrial septum: No defect or patent foramen ovale was identified. - Impressions: GLS -14.9 lower limit normal 15.  Impressions:  - GLS -14.9 lower limit normal 15.  Procedures  Cardiac CAth   Coronary Stent Intervention  Left Heart Cath and Coronary Angiography  Conclusion    Acute inferior ST elevation myocardial infarction commencing approximately 18 hours prior to admission. Ongoing significant chest discomfort with ST elevation noted on EKG.  Total occlusion in the mid RCA of what is a dominant vessel that supplies the entire inferior wall and apical LV.  80-90% diffuse disease in the mid LAD which is a relatively small vessel.  Luminal irregularities noted in a large diagonal of the LAD.  Ostial to proximal 80% stenosis in the circumflex which is a relatively small vessel.  Irregularities within the ramus intermedius but no high-grade obstruction is noted. Less than 50% obstruction.  Successful PTCA and stent of the mid right coronary from 100% to 0% using an Onyx 4.5 x 22 mm drug-eluting stent postdilated with a 4.5 x 15 mm balloon. Catheter based thrombectomy was applied prior to stenting. TIMI grade 3 flow noted post procedure. Very distal PDA and left ventricular branch embolic obstruction.  The patient is allergic to aspirin.  Recommendations:  Brilinta times at least 6 months before switching to Plavix to complete at least one year, but I would recommend indefinite since the patient is unable to use aspirin.  IV Aggrastat 18 hours.  Beta blocker therapy.  High intensity statin therapy.  Consider staged intervention on the mid LAD diffuse disease. Team should review pictures to determine if this should be done prior to discharge.  Indications   Acute ST elevation  myocardial infarction (STEMI) involving other coronary artery of inferior wall (HCC) [I21.19 (ICD-10-CM)]    ASSESSMENT AND PLAN:  1.  CAD with residual disease and continued angina.  Imdur added last visit helped, will increase to 30 BID one AM and one at lunch.to prevent hypotension.  Then if continued pain ranexa would be next addition but would like to hold off for now. Follow up with Dr. Tamala Julian in 2-3 weeks   2.  Recent STEMI now on plavix, allergy to ASA.  SOB with Brilinta.   3. HLD on crestor 20 mg will need to recheck lipids in a few weeks.  4. Fever, with recent PNA treated but now with fever. Discussed with Dr. Curt Bears will have her follow up with PCP.  Though if fever climbs tonight she should go to ER and have blood cultures with fever.  Afebrile here.   5.  Mild LV dysfunction on ACE with recent increase due to HTN.   6. Skin ulcers related to pyoderma gangrenosa. On steroids.   7. Multiple allergies.  8. Slight ST on BB BID.    Current medicines are reviewed with the patient today.  The patient Has no concerns regarding medicines.  The following changes have been made:  See above Labs/ tests ordered today include:see above  Disposition:   FU:  see above  Signed, Cecilie Kicks, NP  09/27/2016 4:08 PM    [facility] [place], [address] [place], [place] Phone: [phone]; Fax: [phone]

## 2016-09-27 NOTE — Patient Instructions (Addendum)
Medication Instructions:   START TAKING IMDUR 30 MG TWICE A DAY   ONE TABLET IN THE AM ONE TABLET IN THE PM   If you need a refill on your cardiac medications before your next appointment, please call your pharmacy.  Labwork: CBC  WITH DIFF BMP AND U/A   Testing/Procedures:NONE ORDERED  TODAY    Follow-Up: WITH DR Katrinka Blazing   2 TO 3 WEEKS WITH INGOLD ON A DAY SMITH IN OFFICE   Any Other Special Instructions Will Be Listed Below (If Applicable).

## 2016-09-28 ENCOUNTER — Emergency Department (HOSPITAL_COMMUNITY): Payer: Medicare Other

## 2016-09-28 ENCOUNTER — Encounter (HOSPITAL_COMMUNITY): Payer: Self-pay

## 2016-09-28 ENCOUNTER — Emergency Department (HOSPITAL_COMMUNITY)
Admission: EM | Admit: 2016-09-28 | Discharge: 2016-09-28 | Disposition: A | Discharge status: Home / Self Care | Payer: Medicare Other | Attending: Emergency Medicine | Admitting: Emergency Medicine

## 2016-09-28 ENCOUNTER — Telehealth: Payer: Self-pay | Admitting: Interventional Cardiology

## 2016-09-28 DIAGNOSIS — B9789 Other viral agents as the cause of diseases classified elsewhere: Secondary | ICD-10-CM | POA: Diagnosis not present

## 2016-09-28 DIAGNOSIS — Z9861 Coronary angioplasty status: Secondary | ICD-10-CM | POA: Insufficient documentation

## 2016-09-28 DIAGNOSIS — Z87891 Personal history of nicotine dependence: Secondary | ICD-10-CM | POA: Diagnosis not present

## 2016-09-28 DIAGNOSIS — Z79899 Other long term (current) drug therapy: Secondary | ICD-10-CM | POA: Diagnosis not present

## 2016-09-28 DIAGNOSIS — E119 Type 2 diabetes mellitus without complications: Secondary | ICD-10-CM | POA: Insufficient documentation

## 2016-09-28 DIAGNOSIS — Z794 Long term (current) use of insulin: Secondary | ICD-10-CM | POA: Diagnosis not present

## 2016-09-28 DIAGNOSIS — J069 Acute upper respiratory infection, unspecified: Secondary | ICD-10-CM | POA: Insufficient documentation

## 2016-09-28 DIAGNOSIS — I2111 ST elevation (STEMI) myocardial infarction involving right coronary artery: Secondary | ICD-10-CM | POA: Diagnosis not present

## 2016-09-28 DIAGNOSIS — I10 Essential (primary) hypertension: Secondary | ICD-10-CM | POA: Insufficient documentation

## 2016-09-28 DIAGNOSIS — Z96653 Presence of artificial knee joint, bilateral: Secondary | ICD-10-CM | POA: Insufficient documentation

## 2016-09-28 DIAGNOSIS — Z7902 Long term (current) use of antithrombotics/antiplatelets: Secondary | ICD-10-CM | POA: Insufficient documentation

## 2016-09-28 DIAGNOSIS — R05 Cough: Secondary | ICD-10-CM | POA: Diagnosis not present

## 2016-09-28 DIAGNOSIS — R0602 Shortness of breath: Secondary | ICD-10-CM | POA: Diagnosis not present

## 2016-09-28 DIAGNOSIS — I2510 Atherosclerotic heart disease of native coronary artery without angina pectoris: Secondary | ICD-10-CM | POA: Insufficient documentation

## 2016-09-28 DIAGNOSIS — I25119 Atherosclerotic heart disease of native coronary artery with unspecified angina pectoris: Secondary | ICD-10-CM | POA: Diagnosis not present

## 2016-09-28 LAB — URINALYSIS
Bilirubin, UA: NEGATIVE
KETONES UA: NEGATIVE
Leukocytes, UA: NEGATIVE
Nitrite, UA: NEGATIVE
PROTEIN UA: NEGATIVE
RBC, UA: NEGATIVE
Specific Gravity, UA: 1.028 (ref 1.005–1.030)
Urobilinogen, Ur: 0.2 mg/dL (ref 0.2–1.0)
pH, UA: 5 (ref 5.0–7.5)

## 2016-09-28 LAB — CBC WITH DIFFERENTIAL/PLATELET
BASOS: 0 %
Basophils Absolute: 0 10*3/uL (ref 0.0–0.2)
EOS (ABSOLUTE): 0.2 10*3/uL (ref 0.0–0.4)
Eos: 2 %
HEMATOCRIT: 45.1 % (ref 34.0–46.6)
Hemoglobin: 15.4 g/dL (ref 11.1–15.9)
IMMATURE GRANULOCYTES: 0 %
Immature Grans (Abs): 0 10*3/uL (ref 0.0–0.1)
LYMPHS ABS: 1.5 10*3/uL (ref 0.7–3.1)
Lymphs: 15 %
MCH: 32.2 pg (ref 26.6–33.0)
MCHC: 34.1 g/dL (ref 31.5–35.7)
MCV: 94 fL (ref 79–97)
MONOS ABS: 0.5 10*3/uL (ref 0.1–0.9)
Monocytes: 5 %
NEUTROS ABS: 7.8 10*3/uL — AB (ref 1.4–7.0)
NEUTROS PCT: 78 %
PLATELETS: 181 10*3/uL (ref 150–379)
RBC: 4.79 x10E6/uL (ref 3.77–5.28)
RDW: 14.3 % (ref 12.3–15.4)
WBC: 10.1 10*3/uL (ref 3.4–10.8)

## 2016-09-28 LAB — BASIC METABOLIC PANEL
BUN / CREAT RATIO: 25 — AB (ref 9–23)
BUN: 13 mg/dL (ref 6–24)
CO2: 26 mmol/L (ref 20–29)
Calcium: 9.2 mg/dL (ref 8.7–10.2)
Chloride: 98 mmol/L (ref 96–106)
Creatinine, Ser: 0.51 mg/dL — ABNORMAL LOW (ref 0.57–1.00)
GFR calc Af Amer: 134 mL/min/{1.73_m2} (ref >59)
GFR, EST NON AFRICAN AMERICAN: 116 mL/min/{1.73_m2} (ref >59)
GLUCOSE: 310 mg/dL — AB (ref 65–99)
Potassium: 4.5 mmol/L (ref 3.5–5.2)
Sodium: 138 mmol/L (ref 134–144)

## 2016-09-28 MED ORDER — FLUTICASONE PROPIONATE 50 MCG/ACT NA SUSP: 1.0000 | Freq: Every day | NASAL | 0 refills | Status: DC

## 2016-09-28 MED ORDER — BENZONATATE 100 MG PO CAPS: 100.0000 mg | ORAL_CAPSULE | Freq: Four times a day (QID) | ORAL | 0 refills | Status: AC | PRN

## 2016-09-28 MED ORDER — ALBUTEROL SULFATE HFA 108 (90 BASE) MCG/ACT IN AERS
2.0000 | INHALATION_SPRAY | RESPIRATORY_TRACT | Status: DC | PRN
  Administered 2016-09-28: 2 via RESPIRATORY_TRACT
  Filled 2016-09-28: qty 6.7

## 2016-09-28 NOTE — Telephone Encounter (Signed)
New message    Amber from [facility] is calling to report some symptoms pt is having to RN. Chest congestion and describes it as heaviness, cold sweats, and hard to catch her breath. Please call.

## 2016-09-28 NOTE — ED Provider Notes (Signed)
[facility] DEPT Provider Note   CSN: [id number] Arrival date & time: 09/28/16  1557     History   Chief Complaint Chief Complaint  Patient presents with  . Cough    HPI Brittney Banks is a 46 y.o. female Presenting with 10 day history of cough.  Patient states 10 days ago she started to have cough and fever, and was diagnosed with pneumonia. She was put on amoxicillin and azithromycin, but after finishing the antibiotics, she states that she feels worse. She states that her cough is worse, and she still feels warm some nights prior to bed. She reports associated nasal congestion. Her cough is worse at night and when she first wakes up in the morning. She denies any wheezing or chest tightness. She denies chest pain, nausea, vomiting, abdominal pain, sore throat, ear pain, or any eye symptoms. Her husband has similar symptoms.  HPI  Past Medical History:  Diagnosis Date  . Allergy   . Behcet's disease [facility])    patient advised RN she has Behcet's disease  . Cellulitis 03/2016  . Coronary artery disease   . DDD (degenerative disc disease), lumbar   . Diabetes mellitus without complication ([place])    type 2  . Hyperlipidemia   . Hypertension   . LV dysfunction   . MRSA carrier 2008  . MRSA infection 2008   BOTH LEGS  . Myocardial infarction involving right coronary artery [facility])     Patient Active Problem List   Diagnosis Date Noted  . Acute ST elevation myocardial infarction (STEMI) of inferior wall ([place]) 08/15/2016  . ST elevation myocardial infarction (STEMI) of inferior wall ([place])   . CAD in native artery   . HTN (hypertension) 04/08/2016  . Chronic pain syndrome 04/05/2016  . Skin ulceration ([place]) 04/05/2016  . Diabetes mellitus type 2 in obese ([place]) 04/05/2016  . Pyoderma gangrenosum   . Cellulitis 04/04/2016  . MRSA infection   . DDD (degenerative disc disease), cervical 08/31/2014  . DDD (degenerative disc disease), thoracic 08/31/2014  . DDD (degenerative  disc disease), lumbar 08/31/2014  . Cervical post-laminectomy syndrome 08/31/2014  . Spinal stenosis, lumbar region, with neurogenic claudication 08/31/2014    Past Surgical History:  Procedure Laterality Date  . BILATERAL CARPAL TUNNEL RELEASE  2007  . CORONARY STENT INTERVENTION N/A 08/15/2016   Procedure: Coronary Stent Intervention;  Surgeon: Belva Crome, MD;  Location: [facility];  Service: Cardiovascular;  Laterality: N/A;  . ECTOPIC PREGNANCY SURGERY     2 SURGERIES  . LEFT HEART CATH AND CORONARY ANGIOGRAPHY N/A 08/15/2016   Procedure: Left Heart Cath and Coronary Angiography;  Surgeon: Belva Crome, MD;  Location: [facility];  Service: Cardiovascular;  Laterality: N/A;  . NECK SURGERY N/A 2008  . REPLACEMENT TOTAL KNEE BILATERAL Right   . TONSILLECTOMY      OB History    No data available       Home Medications    Prior to Admission medications   Medication Sig Start Date End Date Taking? Authorizing Provider  benzonatate (TESSALON) 100 MG capsule Take 1 capsule (100 mg total) by mouth every 6 (six) hours as needed for cough. 09/28/16 10/08/16  Gabi Mcfate, PA-C  carvedilol (COREG) 6.25 MG tablet Take 1 tablet (6.25 mg total) by mouth 2 (two) times daily. 09/16/16   Isaiah Serge, NP  cetirizine (ZYRTEC) 10 MG tablet Take 10 mg by mouth daily.    [provider]  clopidogrel (  PLAVIX) 75 MG tablet Take 1 tablet (75 mg total) by mouth daily. 09/05/16   Isaiah Serge, NP  collagenase (SANTYL) ointment Apply topically daily. 04/09/16   Debbe Odea, MD  fluticasone (FLONASE) 50 MCG/ACT nasal spray Place 1 spray into both nostrils daily. 09/28/16 10/08/16  Naomy Esham, PA-C  glucose monitoring kit (FREESTYLE) monitoring kit 1 each by Does not apply route as needed for other. Dispense any model that is covered- dispense testing supplies for Q AC/ HS accuchecks- 1 month supply with one refil. 04/09/16   Debbe Odea, MD  insulin aspart  (NOVOLOG FLEXPEN) 100 UNIT/ML FlexPen Inject 3-20 Units into the skin 3 (three) times daily with meals. CBG 70 - 120: 0 units  CBG 121 - 150: 3 units  CBG 151 - 200: 4 units  CBG 201 - 250: 7 units  CBG 251 - 300: 11 units  CBG 301 - 350: 15 units  CBG 351 - 400: 20 units 04/09/16   Debbe Odea, MD  insulin detemir (LEVEMIR) 100 UNIT/ML injection Inject 0.2 mLs (20 Units total) into the skin at bedtime. 04/09/16   Debbe Odea, MD  isosorbide mononitrate (IMDUR) 30 MG 24 hr tablet Take 1 tablet (30 mg total) by mouth 2 (two) times daily. 09/27/16 12/26/16  Isaiah Serge, NP  lisinopril (PRINIVIL,ZESTRIL) 20 MG tablet Take 1 tablet (20 mg total) by mouth daily. 09/16/16 12/15/16  Isaiah Serge, NP  metFORMIN (GLUCOPHAGE) 850 MG tablet Take 1 tablet (850 mg total) by mouth 3 (three) times daily with meals. Resume 48 hr after discharge. 08/19/16   Ledora Bottcher, PA  methotrexate (RHEUMATREX) 2.5 MG tablet Take 12.5 mg by mouth every Sunday. Caution:Chemotherapy. Protect from light.    [provider]  morphine (MS CONTIN) 60 MG 12 hr tablet Take 60 mg by mouth every 8 (eight) hours as needed for pain.    [provider]  nitroGLYCERIN (NITROSTAT) 0.4 MG SL tablet Place 1 tablet (0.4 mg total) under the tongue every 5 (five) minutes x 3 doses as needed for chest pain. 08/19/16   [facility], Tami Lin, PA  omeprazole (PRILOSEC OTC) 20 MG tablet Take 20 mg by mouth daily.    [provider]  Oxycodone HCl 20 MG TABS Take 20 mg by mouth every 4 (four) hours as needed (pain).    [provider]  predniSONE (DELTASONE) 10 MG tablet Take 10 mg by mouth 2 (two) times daily with a meal.  04/14/16   [provider]  rosuvastatin (CRESTOR) 20 MG tablet Take 1 tablet (20 mg total) by mouth daily. 09/05/16 12/04/16  Isaiah Serge, NP    Family History Family History  Problem Relation Age of Onset  . Adopted: Yes  . Family history unknown: Yes    Social  History Social History  Substance Use Topics  . Smoking status: Former Smoker    Quit date: 08/17/2010  . Smokeless tobacco: Never Used  . Alcohol use No     Allergies   Anaprox [naproxen sodium]; Aspirin; Ceftin [cefuroxime axetil]; Celexa [citalopram hydrobromide]; Ciprofloxacin; Coconut fragrance; Contrast media [iodinated diagnostic agents]; Cortisol [hydrocortisone]; Elavil [amitriptyline]; Hctz [hydrochlorothiazide]; Imitrex [sumatriptan]; Lexapro [escitalopram oxalate]; Methadone; Metrizamide; Motrin [ibuprofen]; Naprosyn [naproxen]; Neurontin [gabapentin]; Penicillins; Sulfa antibiotics; Sulfasalazine; Tagamet [cimetidine]; Toradol [ketorolac tromethamine]; Vancomycin; Zoloft [sertraline hcl]; and Iodine   Review of Systems Review of Systems  Constitutional: Positive for fever. Negative for chills and fatigue.  HENT: Positive for congestion and rhinorrhea. Negative for ear pain,  sinus pain, sinus pressure, sore throat and trouble swallowing.   Eyes: Negative for pain and itching.  Respiratory: Positive for cough. Negative for shortness of breath and wheezing.   Cardiovascular: Negative for chest pain and palpitations.  Gastrointestinal: Negative for abdominal pain.  Musculoskeletal: Negative for neck pain and neck stiffness.  Skin: Negative for rash.  Neurological: Negative for headaches.  Hematological: Negative for adenopathy.     Physical Exam Updated Vital Signs BP 117/63 (BP Location: Right Arm)   Pulse 87   Temp 98.3 F (36.8 C) (Oral)   Resp 14   LMP  (LMP Unknown)   SpO2 98%   Physical Exam  Constitutional: She is oriented to person, place, and time. She appears well-developed and well-nourished. No distress.  HENT:  Head: Normocephalic and atraumatic.  Right Ear: Hearing, tympanic membrane, external ear and ear canal normal.  Left Ear: Hearing, tympanic membrane, external ear and ear canal normal.  Nose: Mucosal edema present. Right sinus exhibits no  maxillary sinus tenderness and no frontal sinus tenderness. Left sinus exhibits no maxillary sinus tenderness and no frontal sinus tenderness.  Mouth/Throat: Uvula is midline and mucous membranes are normal.  OP with cobblestoning and without exudate or erythema  Eyes: Conjunctivae and EOM are normal. Pupils are equal, round, and reactive to light.  Cardiovascular: Normal rate, regular rhythm, normal heart sounds and intact distal pulses.   Pulmonary/Chest: Effort normal. No respiratory distress. She has wheezes (mild scattered wheezing).  Musculoskeletal: Normal range of motion.  Lymphadenopathy:    She has no cervical adenopathy.  Neurological: She is alert and oriented to person, place, and time.  Skin: Skin is warm. No rash noted. She is not diaphoretic.  Psychiatric: She has a normal mood and affect.     ED Treatments / Results  Labs (all labs ordered are listed, but only abnormal results are displayed) Labs Reviewed - No data to display  EKG  EKG Interpretation None       Radiology Dg Chest 2 View  Result Date: 09/28/2016 CLINICAL DATA:  46 year old female with shortness of breath productive cough and chest tightness for 10 days. Recently diagnosed with pneumonia and treated with antibiotics. EXAM: CHEST  2 VIEW COMPARISON:  09/14/2016. FINDINGS: Lung volumes are stable and within normal limits. There is mildly streaky bilateral lung base opacity with no consolidation or pleural effusion. No other confluent pulmonary opacity. Mediastinal contours remain within normal limits. Visualized tracheal air column is within normal limits. Previous cervical ACDF. No acute osseous abnormality identified. Negative visible bowel gas pattern. IMPRESSION: Stable radiographic appearance of the chest, with clear lungs aside from mild nonspecific streaky bibasilar opacity. Considering the stability I favor lung base scarring or atelectasis over bronchopneumonia. Electronically Signed   By: Genevie Ann  M.D.   On: 09/28/2016 17:12    Procedures Procedures (including critical care time)  Medications Ordered in ED Medications  albuterol (PROVENTIL HFA;VENTOLIN HFA) 108 (90 Base) MCG/ACT inhaler 2 puff (2 puffs Inhalation Given 09/28/16 1800)     Initial Impression / Assessment and Plan / ED Course  I have reviewed the triage vital signs and the nursing notes.  Pertinent labs & imaging results that were available during my care of the patient were reviewed by me and considered in my medical decision making (see chart for details).     Patient symptoms consistent with viral URI. X-ray shows improvement from previous pneumonia. As antibiotics did not make patient feel better, likely viral cause. Will discharge patient  with albuterol inhaler, Tessalon Perles, and Flonase to treat her symptoms. Discussed continuing use of Zyrtec and use of Tylenol when necessary for fever. Discussed return precautions. Patient agrees to plan.  Final Clinical Impressions(s) / ED Diagnoses   Final diagnoses:  Viral URI with cough    New Prescriptions Discharge Medication List as of 09/28/2016  5:50 PM    START taking these medications   Details  benzonatate (TESSALON) 100 MG capsule Take 1 capsule (100 mg total) by mouth every 6 (six) hours as needed for cough., Starting Wed 09/28/2016, Until Sat 10/08/2016, Print    fluticasone (FLONASE) 50 MCG/ACT nasal spray Place 1 spray into both nostrils daily., Starting Wed 09/28/2016, Until Sat 10/08/2016, Print         [place], [place], PA-C 09/28/16 1920    Carmin Muskrat, MD 09/29/16 1228

## 2016-09-28 NOTE — ED Triage Notes (Signed)
Pt presents to the ed for complaints of a cough and having an episode of sweating this morning.  She was just diagnosed with pneumonia and finished her antibiotics on Thursday. States that she has been feeling worse since she stopped them and her homehealth nurse saw her today and said that since she was coughing and sweating earlier she needed to come to the er. Pt is afebrile in triage in no apparent distress, breathing equal, unlabored and breath sounds clear bilaterally.

## 2016-09-28 NOTE — Discharge Instructions (Signed)
Use flonase daily, and continue taking your zyrtec daily.  You may use Tessalon pearls as needed for cough. Use tylenol as needed for fever. Use the albuterol inhaler as needed for wheezing, shortness of breathing, or lung tightness.  Stay well hydrated.  Follow up with your PCP in the next few days if symptoms are not improving.  Return to ED if you develop worsening symptoms, persistent fever, or chest pain.

## 2016-09-28 NOTE — Telephone Encounter (Signed)
Spoke with [facility], nurse with [facility].  She states pt was c/o chest tightness and heaviness today.  Has had intermittent nausea.  Pt broke out in a cold sweat while nurse was there.  Nurse states temp today was 96.3.  Advised nurse that yesterday while pt was in office she was instructed to go to ER if fever persisted.  Nurse felt pt needed to go to ER based on sx.  Advised [facility] nurse that I agreed with this and pt should proceed to ER based on sx and recent PNA diagnosis.  [facility] nurse appreciative and states she will speak with pt about going to ER for evaluation.

## 2016-09-30 DIAGNOSIS — I25119 Atherosclerotic heart disease of native coronary artery with unspecified angina pectoris: Secondary | ICD-10-CM | POA: Diagnosis not present

## 2016-09-30 DIAGNOSIS — I2111 ST elevation (STEMI) myocardial infarction involving right coronary artery: Secondary | ICD-10-CM | POA: Diagnosis not present

## 2016-10-04 ENCOUNTER — Telehealth: Payer: Self-pay | Admitting: Interventional Cardiology

## 2016-10-04 DIAGNOSIS — I25119 Atherosclerotic heart disease of native coronary artery with unspecified angina pectoris: Secondary | ICD-10-CM | POA: Diagnosis not present

## 2016-10-04 DIAGNOSIS — I2111 ST elevation (STEMI) myocardial infarction involving right coronary artery: Secondary | ICD-10-CM | POA: Diagnosis not present

## 2016-10-04 NOTE — Telephone Encounter (Signed)
Brittney Brand, NP  Julio Sicks, LPN        If you could get this pt in to see Dr. Katrinka Blazing in 3-4 weeks post STEMI that would be great. Thank you.!     Spoke with pt and she thought she was already scheduled to see Dr. Katrinka Blazing in July.  Advised her that she was scheduled with Nada Boozer, NP on a day that Dr. Katrinka Blazing is in the office.  Pt states that she would like to keep her current appt and not change it.  Advised we would leave appt the way it is.

## 2016-10-10 DIAGNOSIS — Z6841 Body Mass Index (BMI) 40.0 and over, adult: Secondary | ICD-10-CM | POA: Diagnosis not present

## 2016-10-10 DIAGNOSIS — I252 Old myocardial infarction: Secondary | ICD-10-CM | POA: Diagnosis not present

## 2016-10-10 DIAGNOSIS — R05 Cough: Secondary | ICD-10-CM | POA: Diagnosis not present

## 2016-10-11 DIAGNOSIS — I2111 ST elevation (STEMI) myocardial infarction involving right coronary artery: Secondary | ICD-10-CM | POA: Diagnosis not present

## 2016-10-11 DIAGNOSIS — I25119 Atherosclerotic heart disease of native coronary artery with unspecified angina pectoris: Secondary | ICD-10-CM | POA: Diagnosis not present

## 2016-10-14 DIAGNOSIS — I25119 Atherosclerotic heart disease of native coronary artery with unspecified angina pectoris: Secondary | ICD-10-CM | POA: Diagnosis not present

## 2016-10-14 DIAGNOSIS — I2111 ST elevation (STEMI) myocardial infarction involving right coronary artery: Secondary | ICD-10-CM | POA: Diagnosis not present

## 2016-10-16 DIAGNOSIS — I219 Acute myocardial infarction, unspecified: Secondary | ICD-10-CM

## 2016-10-16 HISTORY — DX: Acute myocardial infarction, unspecified: I21.9

## 2016-10-17 ENCOUNTER — Ambulatory Visit (INDEPENDENT_AMBULATORY_CARE_PROVIDER_SITE_OTHER): Payer: Medicare Other | Admitting: Cardiology

## 2016-10-17 ENCOUNTER — Encounter: Payer: Self-pay | Admitting: Cardiology

## 2016-10-17 VITALS — BP 122/84 | HR 104 | Ht 69.0 in | Wt 299.6 lb

## 2016-10-17 DIAGNOSIS — E782 Mixed hyperlipidemia: Secondary | ICD-10-CM

## 2016-10-17 DIAGNOSIS — I2510 Atherosclerotic heart disease of native coronary artery without angina pectoris: Secondary | ICD-10-CM

## 2016-10-17 DIAGNOSIS — E118 Type 2 diabetes mellitus with unspecified complications: Secondary | ICD-10-CM | POA: Diagnosis not present

## 2016-10-17 DIAGNOSIS — I252 Old myocardial infarction: Secondary | ICD-10-CM | POA: Diagnosis not present

## 2016-10-17 DIAGNOSIS — Z955 Presence of coronary angioplasty implant and graft: Secondary | ICD-10-CM | POA: Diagnosis not present

## 2016-10-17 DIAGNOSIS — Z01818 Encounter for other preprocedural examination: Secondary | ICD-10-CM

## 2016-10-17 DIAGNOSIS — I25119 Atherosclerotic heart disease of native coronary artery with unspecified angina pectoris: Secondary | ICD-10-CM | POA: Diagnosis not present

## 2016-10-17 DIAGNOSIS — Z794 Long term (current) use of insulin: Secondary | ICD-10-CM

## 2016-10-17 DIAGNOSIS — I209 Angina pectoris, unspecified: Secondary | ICD-10-CM | POA: Diagnosis not present

## 2016-10-17 DIAGNOSIS — L88 Pyoderma gangrenosum: Secondary | ICD-10-CM | POA: Diagnosis not present

## 2016-10-17 DIAGNOSIS — I519 Heart disease, unspecified: Secondary | ICD-10-CM

## 2016-10-17 DIAGNOSIS — I2111 ST elevation (STEMI) myocardial infarction involving right coronary artery: Secondary | ICD-10-CM | POA: Diagnosis not present

## 2016-10-17 NOTE — Patient Instructions (Signed)
Medication Instructions:  Your physician recommends that you continue on your current medications as directed. Please refer to the Current Medication list given to you today.'   Labwork: Your physician recommends that you return for lab work on 11/07/16 for pre procedure labs. CBC, CMET, LIPIDS, PT/INR  Testing/Procedures: None Ordered   Follow-Up:   Any Other Special Instructions Will Be Listed Below (If Applicable).    [facility] CARDIOVASCULAR DIVISION [facility] ST OFFICE [address], [address] [place] [address] Dept: [phone] Loc: [phone]  Brittney Banks  10/17/2016  You are scheduled for a left heart cath on 11/09/16 with Dr. Katrinka Blazing.  1. Please arrive at the [facility] (Main Entrance A) at [facility]: [address] [place], [place] [address] at 9:30 (two hours before your procedure to ensure your preparation). Free valet parking service is available.   Special note: Every effort is made to have your procedure done on time. Please understand that emergencies sometimes delay scheduled procedures.  2. Diet: nothing to eat or drink after midnight  3. Labs: 11/07/16  4. Please do not take your glucophage for this procedure. Please only take 15 units of your insulin the night before  5. Plan for one night stay--bring personal belongings. 6. Bring a current list of your medications and current insurance cards. 7. You MUST have a responsible person to drive you home. 8. Someone MUST be with you the first 24 hours after you arrive home or your discharge will be delayed. 9. Please wear clothes that are easy to get on and off and wear slip-on shoes.  Thank you for allowing [place] to care for you!   -- [name]           If you need a refill on your cardiac medications before your next appointment, please call your pharmacy.

## 2016-10-17 NOTE — Progress Notes (Signed)
Cardiology Office Note   Date:  10/17/2016   ID:  Brittney Banks, DOB 1970/09/26, MRN [id number]  PCP:  Ronita Hipps, MD  Cardiologist:  Dr. Tamala Julian    Chief Complaint  Patient presents with  . Coronary Artery Disease      History of Present Illness: Brittney Banks is a 46 y.o. female who presents for CAD with recent STEMI.  She had emergent cath and found to have 100% RCA stenosis and had PCI with ONYX, DES and catheter based thrombectomy prior to stenting. Does have residual CAD. CAD residual with continued pain I discussed with Dr. Tamala Julian in the office and he reviewed films- the LAD is also a small vessel, will try medical management first. With pain I added imdur 30 mg po daily. This has helped has not needed NTG sl since.   do higher dose and eventually add ranexa if needed.   She did not tolerate Brilinta so changed to plavix and the SOB improved.   She has pyoderma gangrenosum and is on steroids  CXR with infiltrate and we added abx.  Zithromax and Zyvox. Today she has competed abx but now with fever.  Up to 101.5 last pm.   N, V &. Diarrhea on lipitor 80 we changed to Crestor 20 mg.  And no further N,V or diarrhea.    Last visit she had fever and went to ER and dx with viral process.   Today over all feels better with virus.  She still has angina more with exertion despite imdur twice a day. Now most of pain is only in her back, does not last long 30 sec to 1 min and if she stops it resolves.  Only in her back now when previously in chest and back.  + SOB and diaphoresis with it but no nausea.  She has only needed NTG twice in last 2 weeks.    No further diarrhea and SOB improved overall.     She does walk her dog 15 min 4 times a day.  With her pyoderma gangrenosum she has lesions on her legs.  She is to see derm on the 17th at [facility].     Past Medical History:  Diagnosis Date  . Allergy   . Behcet's disease [facility])    patient advised RN she has Behcet's disease  .  Cellulitis 03/2016  . Coronary artery disease   . DDD (degenerative disc disease), lumbar   . Diabetes mellitus without complication ([place])    type 2  . Hyperlipidemia   . Hypertension   . LV dysfunction   . MRSA carrier 2008  . MRSA infection 2008   BOTH LEGS  . Myocardial infarction involving right coronary artery [facility])     Past Surgical History:  Procedure Laterality Date  . BILATERAL CARPAL TUNNEL RELEASE  2007  . CORONARY STENT INTERVENTION N/A 08/15/2016   Procedure: Coronary Stent Intervention;  Surgeon: Belva Crome, MD;  Location: [place] [facility];  Service: Cardiovascular;  Laterality: N/A;  . ECTOPIC PREGNANCY SURGERY     2 SURGERIES  . LEFT HEART CATH AND CORONARY ANGIOGRAPHY N/A 08/15/2016   Procedure: Left Heart Cath and Coronary Angiography;  Surgeon: Belva Crome, MD;  Location: [place] [facility];  Service: Cardiovascular;  Laterality: N/A;  . NECK SURGERY N/A 2008  . REPLACEMENT TOTAL KNEE BILATERAL Right   . TONSILLECTOMY       Current Outpatient Prescriptions  Medication Sig Dispense Refill  .  carvedilol (COREG) 6.25 MG tablet Take 1 tablet (6.25 mg total) by mouth 2 (two) times daily. 180 tablet 3  . cetirizine (ZYRTEC) 10 MG tablet Take 10 mg by mouth daily.    . clopidogrel (PLAVIX) 75 MG tablet Take 1 tablet (75 mg total) by mouth daily. 35 tablet 5  . collagenase (SANTYL) ointment Apply topically daily. 15 g 0  . fluticasone (FLONASE) 50 MCG/ACT nasal spray Place 1 spray into both nostrils daily.    Marland Kitchen glucose monitoring kit (FREESTYLE) monitoring kit 1 each by Does not apply route as needed for other. Dispense any model that is covered- dispense testing supplies for Q AC/ HS accuchecks- 1 month supply with one refil. 1 each 1  . insulin aspart (NOVOLOG FLEXPEN) 100 UNIT/ML FlexPen Inject 3-20 Units into the skin 3 (three) times daily with meals. CBG 70 - 120: 0 units  CBG 121 - 150: 3 units  CBG 151 - 200: 4 units  CBG 201 - 250: 7 units  CBG 251 -  300: 11 units  CBG 301 - 350: 15 units  CBG 351 - 400: 20 units 15 mL 11  . insulin detemir (LEVEMIR) 100 UNIT/ML injection Inject 0.2 mLs (20 Units total) into the skin at bedtime. 10 mL 11  . isosorbide mononitrate (IMDUR) 30 MG 24 hr tablet Take 1 tablet (30 mg total) by mouth 2 (two) times daily. 60 tablet 3  . lisinopril (PRINIVIL,ZESTRIL) 20 MG tablet Take 1 tablet (20 mg total) by mouth daily. 90 tablet 3  . metFORMIN (GLUCOPHAGE) 850 MG tablet Take 1 tablet (850 mg total) by mouth 3 (three) times daily with meals. Resume 48 hr after discharge.    . methotrexate (RHEUMATREX) 2.5 MG tablet Take 12.5 mg by mouth every Sunday. Caution:Chemotherapy. Protect from light.    . morphine (MS CONTIN) 60 MG 12 hr tablet Take 60 mg by mouth every 8 (eight) hours as needed for pain.    . nitroGLYCERIN (NITROSTAT) 0.4 MG SL tablet Place 1 tablet (0.4 mg total) under the tongue every 5 (five) minutes x 3 doses as needed for chest pain. 25 tablet 3  . omeprazole (PRILOSEC OTC) 20 MG tablet Take 20 mg by mouth daily.    . Oxycodone HCl 20 MG TABS Take 20 mg by mouth every 4 (four) hours as needed (pain).    . predniSONE (DELTASONE) 10 MG tablet Take 10 mg by mouth 2 (two) times daily with a meal.     . rosuvastatin (CRESTOR) 20 MG tablet Take 1 tablet (20 mg total) by mouth daily. 30 tablet 5   No current facility-administered medications for this visit.     Allergies:   Anaprox [naproxen sodium]; Aspirin; Brilinta [ticagrelor]; Ceftin [cefuroxime axetil]; Celexa [citalopram hydrobromide]; Ciprofloxacin; Coconut fragrance; Contrast media [iodinated diagnostic agents]; Cortisol [hydrocortisone]; Elavil [amitriptyline]; Hctz [hydrochlorothiazide]; Imitrex [sumatriptan]; Lexapro [escitalopram oxalate]; Methadone; Metrizamide; Motrin [ibuprofen]; Naprosyn [naproxen]; Neurontin [gabapentin]; Penicillins; Sulfa antibiotics; Sulfasalazine; Tagamet [cimetidine]; Toradol [ketorolac tromethamine]; Vancomycin; Zoloft  [sertraline hcl]; Iodine; and Lipitor [atorvastatin]    Social History:  The patient  reports that she quit smoking about 6 years ago. She has never used smokeless tobacco. She reports that she does not drink alcohol or use drugs.   Family History:  The patient's She was adopted. Family history is unknown by patient.    ROS:  General:no further colds or fevers, no weight changes Skin:no rashes + chronic ulcers HEENT:no blurred vision, no congestion CV:see HPI PUL:see HPI GI:no diarrhea constipation  or melena, no indigestion GU:no hematuria, no dysuria MS:no joint pain, no claudication Neuro:no syncope, no lightheadedness Endo:+ diabetes stable, no thyroid disease  Wt Readings from Last 3 Encounters:  10/17/16 299 lb 9.6 oz (135.9 kg)  09/27/16 298 lb 6.4 oz (135.4 kg)  09/05/16 (!) 302 lb 6.4 oz (137.2 kg)     PHYSICAL EXAM: VS:  BP 122/84   Pulse (!) 104   Ht '5\' 9"'$  (1.753 m)   Wt 299 lb 9.6 oz (135.9 kg)   LMP  (LMP Unknown)   BMI 44.24 kg/m  , BMI Body mass index is 44.24 kg/m. General:Pleasant affect, NAD Skin:Warm and dry, brisk capillary refill, multiple ulcers on arms and legs, legs wrapped HEENT:normocephalic, sclera clear, mucus membranes moist Neck:supple, no JVD, no bruits  Heart:S1S2 RRR without murmur, gallup, rub or click Lungs:clear without rales, rhonchi, or wheezes DBZ:MCEYE,MVVK, non tender, + BS, do not palpate liver spleen or masses Ext:no lower ext edema, 2+ pedal pulses, 2+ radial pulses Neuro:alert and oriented X 3, MAE, follows commands, + facial symmetry    EKG:  EKG is not ordered today.    Recent Labs: 08/15/2016: ALT 55; B Natriuretic Peptide 227.0; TSH 0.504 09/27/2016: BUN 13; Creatinine, Ser 0.51; Hemoglobin 15.4; Platelets 181; Potassium 4.5; Sodium 138    Lipid Panel    Component Value Date/Time   CHOL 200 08/16/2016 0342   TRIG 122 08/16/2016 0342   HDL 29 (L) 08/16/2016 0342   CHOLHDL 6.9 08/16/2016 0342   VLDL 24  08/16/2016 0342   LDLCALC 147 (H) 08/16/2016 0342       Other studies Reviewed: Additional studies/ records that were reviewed today include: . Belva Crome, MD (Primary)    Procedures   Coronary Stent Intervention  Left Heart Cath and Coronary Angiography  Conclusion    Acute inferior ST elevation myocardial infarction commencing approximately 18 hours prior to admission. Ongoing significant chest discomfort with ST elevation noted on EKG.  Total occlusion in the mid RCA of what is a dominant vessel that supplies the entire inferior wall and apical LV.  80-90% diffuse disease in the mid LAD which is a relatively small vessel.  Luminal irregularities noted in a large diagonal of the LAD.  Ostial to proximal 80% stenosis in the circumflex which is a relatively small vessel.  Irregularities within the ramus intermedius but no high-grade obstruction is noted. Less than 50% obstruction.  Successful PTCA and stent of the mid right coronary from 100% to 0% using an Onyx 4.5 x 22 mm drug-eluting stent postdilated with a 4.5 x 15 mm balloon. Catheter based thrombectomy was applied prior to stenting. TIMI grade 3 flow noted post procedure. Very distal PDA and left ventricular branch embolic obstruction.  The patient is allergic to aspirin.  Recommendations:  Brilinta times at least 6 months before switching to Plavix to complete at least one year, but I would recommend indefinite since the patient is unable to use aspirin.  IV Aggrastat 18 hours.  Beta blocker therapy.  High intensity statin therapy.  Consider staged intervention on the mid LAD diffuse disease. Team should review pictures to determine if this should be done prior to discharge.   Coronary Diagrams   Diagnostic Diagram       Post-Intervention Diagram        ECHO 08/17/16 Study Conclusions  - Left ventricle: Mild distal septal and inferior wall hypokinesis.   The cavity size was normal. Wall  thickness was increased in a  pattern of mild LVH. Systolic function was normal. The estimated   ejection fraction was in the range of 50% to 55%. Wall motion was   normal; there were no regional wall motion abnormalities. - Atrial septum: No defect or patent foramen ovale was identified. - Impressions: GLS -14.9 lower limit normal 15.  Impressions:  - GLS -14.9 lower limit normal 15.  ASSESSMENT AND PLAN:  1.  Continued angina despite increasing Imdur.  Discussed with Dr. Tamala Julian who reviewed the films and felt though small vessel there was a focal lesion.  Will proceed to cath.  Discussed with pt and her mother.  With their schedule and Dr. Thompson Caul best date was 7/25 she will have labs done that week on 23rd including lipids and CMP. Discussed cardiac cath with both pt and her mother.    The patient understands that risks included but are not limited to stroke (1 in 1000), death (1 in 26), kidney failure [usually temporary] (1 in 500), bleeding (1 in 200), allergic reaction [possibly serious] (1 in 200).   2. STEMI of RCA in 5.2018 - tolerating plavix, with allergy to ASA.  On BB, statin   SOB with Brilinta  3. CAD see #1.  HLD on crestor will check lipids when she has pre cath labs drawn.  4.  PNA has resolved.   5. Mild LV dysfunction on ACE  6. Skin ulcers related to pyoderma gangrenosa.  7.  Multiple allergies.  8. DM-2 per PCP  Current medicines are reviewed with the patient today.  The patient Has no concerns regarding medicines.  The following changes have been made:  See above Labs/ tests ordered today include:see above  Disposition:   FU:  see above  Signed, Cecilie Kicks, NP  10/17/2016 6:05 PM    [facility] [place], [place], [place], [place] Phone: [phone]; Fax: [phone]

## 2016-10-18 DIAGNOSIS — G894 Chronic pain syndrome: Secondary | ICD-10-CM | POA: Diagnosis not present

## 2016-10-18 DIAGNOSIS — M5136 Other intervertebral disc degeneration, lumbar region: Secondary | ICD-10-CM | POA: Diagnosis not present

## 2016-10-18 DIAGNOSIS — M542 Cervicalgia: Secondary | ICD-10-CM | POA: Diagnosis not present

## 2016-10-18 DIAGNOSIS — Z79891 Long term (current) use of opiate analgesic: Secondary | ICD-10-CM | POA: Diagnosis not present

## 2016-10-18 DIAGNOSIS — M47812 Spondylosis without myelopathy or radiculopathy, cervical region: Secondary | ICD-10-CM | POA: Diagnosis not present

## 2016-10-18 DIAGNOSIS — M4322 Fusion of spine, cervical region: Secondary | ICD-10-CM | POA: Diagnosis not present

## 2016-10-18 DIAGNOSIS — M533 Sacrococcygeal disorders, not elsewhere classified: Secondary | ICD-10-CM | POA: Diagnosis not present

## 2016-10-18 DIAGNOSIS — M47817 Spondylosis without myelopathy or radiculopathy, lumbosacral region: Secondary | ICD-10-CM | POA: Diagnosis not present

## 2016-10-18 DIAGNOSIS — M5416 Radiculopathy, lumbar region: Secondary | ICD-10-CM | POA: Diagnosis not present

## 2016-10-18 DIAGNOSIS — Z8614 Personal history of Methicillin resistant Staphylococcus aureus infection: Secondary | ICD-10-CM | POA: Diagnosis not present

## 2016-10-18 DIAGNOSIS — M5137 Other intervertebral disc degeneration, lumbosacral region: Secondary | ICD-10-CM | POA: Diagnosis not present

## 2016-10-18 DIAGNOSIS — M47816 Spondylosis without myelopathy or radiculopathy, lumbar region: Secondary | ICD-10-CM | POA: Diagnosis not present

## 2016-10-20 DIAGNOSIS — I25119 Atherosclerotic heart disease of native coronary artery with unspecified angina pectoris: Secondary | ICD-10-CM | POA: Diagnosis not present

## 2016-10-20 DIAGNOSIS — I2111 ST elevation (STEMI) myocardial infarction involving right coronary artery: Secondary | ICD-10-CM | POA: Diagnosis not present

## 2016-10-24 ENCOUNTER — Other Ambulatory Visit: Payer: Medicare Other

## 2016-10-24 DIAGNOSIS — I2111 ST elevation (STEMI) myocardial infarction involving right coronary artery: Secondary | ICD-10-CM | POA: Diagnosis not present

## 2016-10-24 DIAGNOSIS — I25119 Atherosclerotic heart disease of native coronary artery with unspecified angina pectoris: Secondary | ICD-10-CM | POA: Diagnosis not present

## 2016-10-27 DIAGNOSIS — I2111 ST elevation (STEMI) myocardial infarction involving right coronary artery: Secondary | ICD-10-CM | POA: Diagnosis not present

## 2016-10-27 DIAGNOSIS — I25119 Atherosclerotic heart disease of native coronary artery with unspecified angina pectoris: Secondary | ICD-10-CM | POA: Diagnosis not present

## 2016-10-31 DIAGNOSIS — I2111 ST elevation (STEMI) myocardial infarction involving right coronary artery: Secondary | ICD-10-CM | POA: Diagnosis not present

## 2016-10-31 DIAGNOSIS — I25119 Atherosclerotic heart disease of native coronary artery with unspecified angina pectoris: Secondary | ICD-10-CM | POA: Diagnosis not present

## 2016-11-01 DIAGNOSIS — Z79899 Other long term (current) drug therapy: Secondary | ICD-10-CM | POA: Diagnosis not present

## 2016-11-01 DIAGNOSIS — L97929 Non-pressure chronic ulcer of unspecified part of left lower leg with unspecified severity: Secondary | ICD-10-CM | POA: Diagnosis not present

## 2016-11-01 DIAGNOSIS — I252 Old myocardial infarction: Secondary | ICD-10-CM | POA: Diagnosis not present

## 2016-11-01 DIAGNOSIS — E259 Adrenogenital disorder, unspecified: Secondary | ICD-10-CM | POA: Diagnosis not present

## 2016-11-01 DIAGNOSIS — L97919 Non-pressure chronic ulcer of unspecified part of right lower leg with unspecified severity: Secondary | ICD-10-CM | POA: Diagnosis not present

## 2016-11-01 DIAGNOSIS — Z794 Long term (current) use of insulin: Secondary | ICD-10-CM | POA: Diagnosis not present

## 2016-11-01 DIAGNOSIS — Z5181 Encounter for therapeutic drug level monitoring: Secondary | ICD-10-CM | POA: Diagnosis not present

## 2016-11-01 DIAGNOSIS — E11621 Type 2 diabetes mellitus with foot ulcer: Secondary | ICD-10-CM | POA: Diagnosis not present

## 2016-11-01 DIAGNOSIS — L88 Pyoderma gangrenosum: Secondary | ICD-10-CM | POA: Diagnosis not present

## 2016-11-03 DIAGNOSIS — I2111 ST elevation (STEMI) myocardial infarction involving right coronary artery: Secondary | ICD-10-CM | POA: Diagnosis not present

## 2016-11-03 DIAGNOSIS — I25119 Atherosclerotic heart disease of native coronary artery with unspecified angina pectoris: Secondary | ICD-10-CM | POA: Diagnosis not present

## 2016-11-07 ENCOUNTER — Other Ambulatory Visit: Payer: Medicare Other | Admitting: *Deleted

## 2016-11-07 DIAGNOSIS — I2510 Atherosclerotic heart disease of native coronary artery without angina pectoris: Secondary | ICD-10-CM

## 2016-11-07 DIAGNOSIS — I10 Essential (primary) hypertension: Secondary | ICD-10-CM

## 2016-11-07 LAB — LIPID PANEL
CHOLESTEROL TOTAL: 125 mg/dL (ref 100–199)
Chol/HDL Ratio: 3.4 ratio (ref 0.0–4.4)
HDL: 37 mg/dL — ABNORMAL LOW (ref >39)
LDL Calculated: 47 mg/dL (ref 0–99)
TRIGLYCERIDES: 206 mg/dL — AB (ref 0–149)
VLDL Cholesterol Cal: 41 mg/dL — ABNORMAL HIGH (ref 5–40)

## 2016-11-07 LAB — COMPREHENSIVE METABOLIC PANEL
A/G RATIO: 1.8 (ref 1.2–2.2)
ALBUMIN: 4.2 g/dL (ref 3.5–5.5)
ALK PHOS: 91 IU/L (ref 39–117)
ALT: 42 IU/L — ABNORMAL HIGH (ref 0–32)
AST: 20 IU/L (ref 0–40)
BUN / CREAT RATIO: 24 — AB (ref 9–23)
BUN: 17 mg/dL (ref 6–24)
Bilirubin Total: 0.3 mg/dL (ref 0.0–1.2)
CO2: 30 mmol/L — AB (ref 20–29)
CREATININE: 0.7 mg/dL (ref 0.57–1.00)
Calcium: 9.7 mg/dL (ref 8.7–10.2)
Chloride: 98 mmol/L (ref 96–106)
GFR calc Af Amer: 121 mL/min/{1.73_m2} (ref >59)
GFR, EST NON AFRICAN AMERICAN: 105 mL/min/{1.73_m2} (ref >59)
GLOBULIN, TOTAL: 2.4 g/dL (ref 1.5–4.5)
Glucose: 339 mg/dL — ABNORMAL HIGH (ref 65–99)
POTASSIUM: 4.2 mmol/L (ref 3.5–5.2)
SODIUM: 136 mmol/L (ref 134–144)
Total Protein: 6.6 g/dL (ref 6.0–8.5)

## 2016-11-07 LAB — PROTIME-INR
INR: 1 (ref 0.8–1.2)
Prothrombin Time: 9.9 s (ref 9.1–12.0)

## 2016-11-07 LAB — CBC WITH DIFFERENTIAL/PLATELET
BASOS: 0 %
Basophils Absolute: 0 10*3/uL (ref 0.0–0.2)
EOS (ABSOLUTE): 0.2 10*3/uL (ref 0.0–0.4)
Eos: 2 %
HEMATOCRIT: 44.4 % (ref 34.0–46.6)
Hemoglobin: 15.5 g/dL (ref 11.1–15.9)
Lymphocytes Absolute: 3.9 10*3/uL — ABNORMAL HIGH (ref 0.7–3.1)
Lymphs: 30 %
MCH: 32.7 pg (ref 26.6–33.0)
MCHC: 34.9 g/dL (ref 31.5–35.7)
MCV: 94 fL (ref 79–97)
MONOCYTES: 7 %
Monocytes Absolute: 0.9 10*3/uL (ref 0.1–0.9)
NEUTROS ABS: 8.1 10*3/uL — AB (ref 1.4–7.0)
Neutrophils: 61 %
Platelets: 219 10*3/uL (ref 150–379)
RBC: 4.74 x10E6/uL (ref 3.77–5.28)
RDW: 14 % (ref 12.3–15.4)
WBC: 13.1 10*3/uL — ABNORMAL HIGH (ref 3.4–10.8)

## 2016-11-07 NOTE — Progress Notes (Signed)
inr

## 2016-11-07 NOTE — Addendum Note (Signed)
Addended by: Tonita Phoenix on: 11/07/2016 11:28 AM   Modules accepted: Orders

## 2016-11-08 ENCOUNTER — Telehealth: Payer: Self-pay | Admitting: Interventional Cardiology

## 2016-11-08 ENCOUNTER — Telehealth: Payer: Self-pay

## 2016-11-08 DIAGNOSIS — I25119 Atherosclerotic heart disease of native coronary artery with unspecified angina pectoris: Secondary | ICD-10-CM | POA: Diagnosis not present

## 2016-11-08 DIAGNOSIS — I2111 ST elevation (STEMI) myocardial infarction involving right coronary artery: Secondary | ICD-10-CM | POA: Diagnosis not present

## 2016-11-08 MED ORDER — PREDNISONE 50 MG PO TABS: ORAL_TABLET | ORAL | 0 refills | Status: DC

## 2016-11-08 NOTE — Telephone Encounter (Signed)
Call placed to Pt.  Pt notified of prescription called to pharmacy.  Order placed for prednisone 50 mg every 6 hours beginning at 6:00 pm tonight.  Pt will take 3 doses prior to procedure tomorrow.  Pt indicates understanding-confirmed sent to correct pharmacy.

## 2016-11-08 NOTE — Telephone Encounter (Signed)
-----   Message from Jolyn Nap, RN sent at 11/08/2016  6:45 AM EDT ----- Regarding: Contrast allergy  Boneta Lucks,  Ms [facility] for tomorrow has a contrast allergy, Vernona Rieger forget to treat her.  Can you call the patient a prescription in for prednison 50mg  take every 6 hours times 3. 6pm, midnight and 6am.  Thanks  [employer]

## 2016-11-09 ENCOUNTER — Encounter (HOSPITAL_COMMUNITY): Payer: Self-pay | Admitting: Cardiology

## 2016-11-09 ENCOUNTER — Ambulatory Visit (HOSPITAL_COMMUNITY)
Admission: RE | Disposition: A | Discharge status: Home / Self Care | Payer: Self-pay | Source: Ambulatory Visit | Attending: Interventional Cardiology

## 2016-11-09 ENCOUNTER — Ambulatory Visit (HOSPITAL_COMMUNITY)
Admission: RE | Admit: 2016-11-09 | Discharge: 2016-11-09 | Disposition: A | Discharge status: Home / Self Care | Payer: Medicare Other | Source: Ambulatory Visit | Attending: Interventional Cardiology | Admitting: Interventional Cardiology

## 2016-11-09 DIAGNOSIS — Z955 Presence of coronary angioplasty implant and graft: Secondary | ICD-10-CM | POA: Diagnosis not present

## 2016-11-09 DIAGNOSIS — Z7951 Long term (current) use of inhaled steroids: Secondary | ICD-10-CM | POA: Insufficient documentation

## 2016-11-09 DIAGNOSIS — E669 Obesity, unspecified: Secondary | ICD-10-CM | POA: Insufficient documentation

## 2016-11-09 DIAGNOSIS — I25118 Atherosclerotic heart disease of native coronary artery with other forms of angina pectoris: Secondary | ICD-10-CM | POA: Diagnosis present

## 2016-11-09 DIAGNOSIS — E785 Hyperlipidemia, unspecified: Secondary | ICD-10-CM | POA: Insufficient documentation

## 2016-11-09 DIAGNOSIS — I252 Old myocardial infarction: Secondary | ICD-10-CM | POA: Insufficient documentation

## 2016-11-09 DIAGNOSIS — I2582 Chronic total occlusion of coronary artery: Secondary | ICD-10-CM | POA: Diagnosis not present

## 2016-11-09 DIAGNOSIS — Z22322 Carrier or suspected carrier of Methicillin resistant Staphylococcus aureus: Secondary | ICD-10-CM | POA: Diagnosis not present

## 2016-11-09 DIAGNOSIS — Z91041 Radiographic dye allergy status: Secondary | ICD-10-CM | POA: Insufficient documentation

## 2016-11-09 DIAGNOSIS — M5136 Other intervertebral disc degeneration, lumbar region: Secondary | ICD-10-CM | POA: Diagnosis not present

## 2016-11-09 DIAGNOSIS — Z87891 Personal history of nicotine dependence: Secondary | ICD-10-CM | POA: Insufficient documentation

## 2016-11-09 DIAGNOSIS — Z794 Long term (current) use of insulin: Secondary | ICD-10-CM | POA: Diagnosis not present

## 2016-11-09 DIAGNOSIS — Z7902 Long term (current) use of antithrombotics/antiplatelets: Secondary | ICD-10-CM | POA: Insufficient documentation

## 2016-11-09 DIAGNOSIS — Z883 Allergy status to other anti-infective agents status: Secondary | ICD-10-CM | POA: Diagnosis not present

## 2016-11-09 DIAGNOSIS — E119 Type 2 diabetes mellitus without complications: Secondary | ICD-10-CM | POA: Diagnosis present

## 2016-11-09 DIAGNOSIS — Z7952 Long term (current) use of systemic steroids: Secondary | ICD-10-CM | POA: Insufficient documentation

## 2016-11-09 DIAGNOSIS — L98499 Non-pressure chronic ulcer of skin of other sites with unspecified severity: Secondary | ICD-10-CM | POA: Diagnosis not present

## 2016-11-09 DIAGNOSIS — Z880 Allergy status to penicillin: Secondary | ICD-10-CM | POA: Diagnosis not present

## 2016-11-09 DIAGNOSIS — Z6841 Body Mass Index (BMI) 40.0 and over, adult: Secondary | ICD-10-CM | POA: Insufficient documentation

## 2016-11-09 DIAGNOSIS — Z882 Allergy status to sulfonamides status: Secondary | ICD-10-CM | POA: Insufficient documentation

## 2016-11-09 DIAGNOSIS — I10 Essential (primary) hypertension: Secondary | ICD-10-CM | POA: Diagnosis not present

## 2016-11-09 DIAGNOSIS — I2119 ST elevation (STEMI) myocardial infarction involving other coronary artery of inferior wall: Secondary | ICD-10-CM | POA: Diagnosis present

## 2016-11-09 DIAGNOSIS — I25119 Atherosclerotic heart disease of native coronary artery with unspecified angina pectoris: Secondary | ICD-10-CM | POA: Diagnosis present

## 2016-11-09 DIAGNOSIS — E1169 Type 2 diabetes mellitus with other specified complication: Secondary | ICD-10-CM | POA: Diagnosis present

## 2016-11-09 DIAGNOSIS — L88 Pyoderma gangrenosum: Secondary | ICD-10-CM | POA: Diagnosis not present

## 2016-11-09 DIAGNOSIS — M352 Behcet's disease: Secondary | ICD-10-CM | POA: Insufficient documentation

## 2016-11-09 HISTORY — PX: ULTRASOUND GUIDANCE FOR VASCULAR ACCESS: SHX6516

## 2016-11-09 HISTORY — PX: LEFT HEART CATH AND CORONARY ANGIOGRAPHY: CATH118249

## 2016-11-09 HISTORY — PX: CORONARY STENT INTERVENTION: CATH118234

## 2016-11-09 LAB — POCT ACTIVATED CLOTTING TIME: Activated Clotting Time: 351 seconds

## 2016-11-09 LAB — GLUCOSE, CAPILLARY
GLUCOSE-CAPILLARY: 285 mg/dL — AB (ref 65–99)
GLUCOSE-CAPILLARY: 283 mg/dL — AB (ref 65–99)

## 2016-11-09 SURGERY — CORONARY STENT INTERVENTION
Anesthesia: LOCAL

## 2016-11-09 MED ORDER — PREDNISONE 5 MG PO TABS: 5.0000 mg | ORAL_TABLET | Freq: Every day | ORAL | Status: DC

## 2016-11-09 MED ORDER — INSULIN ASPART 100 UNIT/ML ~~LOC~~ SOLN
SUBCUTANEOUS | Status: AC
  Administered 2016-11-09: 5 [IU] via SUBCUTANEOUS
  Filled 2016-11-09: qty 1

## 2016-11-09 MED ORDER — SODIUM CHLORIDE 0.9% FLUSH: 3.0000 mL | Freq: Two times a day (BID) | INTRAVENOUS | Status: DC

## 2016-11-09 MED ORDER — MORPHINE SULFATE ER 60 MG PO TBCR
60.0000 mg | EXTENDED_RELEASE_TABLET | Freq: Three times a day (TID) | ORAL | Status: DC | PRN
Start: 2016-11-09 — End: 2016-11-09

## 2016-11-09 MED ORDER — ONDANSETRON HCL 4 MG/2ML IJ SOLN: 4.0000 mg | Freq: Four times a day (QID) | INTRAMUSCULAR | Status: DC | PRN

## 2016-11-09 MED ORDER — HEPARIN SODIUM (PORCINE) 1000 UNIT/ML IJ SOLN
INTRAMUSCULAR | Status: DC | PRN
  Administered 2016-11-09 (×2): 6000 [IU] via INTRAVENOUS

## 2016-11-09 MED ORDER — CLOPIDOGREL BISULFATE 75 MG PO TABS
ORAL_TABLET | ORAL | Status: AC
  Filled 2016-11-09: qty 1

## 2016-11-09 MED ORDER — OXYCODONE HCL 20 MG PO TABS: 20.0000 mg | ORAL_TABLET | ORAL | Status: DC | PRN

## 2016-11-09 MED ORDER — LIDOCAINE HCL (PF) 1 % IJ SOLN
INTRAMUSCULAR | Status: DC | PRN
  Administered 2016-11-09: 3 mL

## 2016-11-09 MED ORDER — MIDAZOLAM HCL 2 MG/2ML IJ SOLN
INTRAMUSCULAR | Status: DC | PRN
  Administered 2016-11-09 (×2): 1 mg via INTRAVENOUS

## 2016-11-09 MED ORDER — INSULIN DETEMIR 100 UNIT/ML ~~LOC~~ SOLN: 20.0000 [IU] | Freq: Every day | SUBCUTANEOUS | Status: DC

## 2016-11-09 MED ORDER — DIPHENHYDRAMINE HCL 50 MG/ML IJ SOLN
INTRAMUSCULAR | Status: AC
  Administered 2016-11-09: 25 mg via INTRAVENOUS
  Filled 2016-11-09: qty 1

## 2016-11-09 MED ORDER — CLOPIDOGREL BISULFATE 75 MG PO TABS: 75.0000 mg | ORAL_TABLET | Freq: Every day | ORAL | Status: DC

## 2016-11-09 MED ORDER — LORATADINE 10 MG PO TABS: 10.0000 mg | ORAL_TABLET | Freq: Every day | ORAL | Status: DC

## 2016-11-09 MED ORDER — CLOPIDOGREL BISULFATE 300 MG PO TABS
ORAL_TABLET | ORAL | Status: DC | PRN
  Administered 2016-11-09: 225 mg via ORAL

## 2016-11-09 MED ORDER — IOPAMIDOL (ISOVUE-370) INJECTION 76%
INTRAVENOUS | Status: AC
  Filled 2016-11-09: qty 125

## 2016-11-09 MED ORDER — ISOSORBIDE MONONITRATE ER 30 MG PO TB24: 30.0000 mg | ORAL_TABLET | Freq: Two times a day (BID) | ORAL | Status: DC

## 2016-11-09 MED ORDER — IOPAMIDOL (ISOVUE-370) INJECTION 76%
INTRAVENOUS | Status: AC
  Filled 2016-11-09: qty 100

## 2016-11-09 MED ORDER — ANGIOPLASTY BOOK
Freq: Once | Status: DC
  Filled 2016-11-09: qty 1

## 2016-11-09 MED ORDER — FENTANYL CITRATE (PF) 100 MCG/2ML IJ SOLN
INTRAMUSCULAR | Status: AC
  Filled 2016-11-09: qty 2

## 2016-11-09 MED ORDER — CLOPIDOGREL BISULFATE 75 MG PO TABS: 75.0000 mg | ORAL_TABLET | ORAL | Status: DC

## 2016-11-09 MED ORDER — INSULIN ASPART 100 UNIT/ML ~~LOC~~ SOLN
SUBCUTANEOUS | Status: DC
Start: 2016-11-09 — End: 2016-11-09
  Filled 2016-11-09: qty 1

## 2016-11-09 MED ORDER — NITROGLYCERIN 0.4 MG SL SUBL: 0.4000 mg | SUBLINGUAL_TABLET | SUBLINGUAL | Status: DC | PRN

## 2016-11-09 MED ORDER — SODIUM CHLORIDE 0.9 % IV SOLN: 250.0000 mL | INTRAVENOUS | Status: DC | PRN

## 2016-11-09 MED ORDER — HEPARIN (PORCINE) IN NACL 2-0.9 UNIT/ML-% IJ SOLN
INTRAMUSCULAR | Status: AC
  Filled 2016-11-09: qty 1000

## 2016-11-09 MED ORDER — FREESTYLE SYSTEM KIT: 1.0000 | PACK | Status: DC | PRN

## 2016-11-09 MED ORDER — LABETALOL HCL 5 MG/ML IV SOLN: 10.0000 mg | INTRAVENOUS | Status: AC | PRN

## 2016-11-09 MED ORDER — HEPARIN SODIUM (PORCINE) 1000 UNIT/ML IJ SOLN
INTRAMUSCULAR | Status: AC
  Filled 2016-11-09: qty 1

## 2016-11-09 MED ORDER — DIPHENHYDRAMINE HCL 50 MG/ML IJ SOLN
25.0000 mg | Freq: Once | INTRAMUSCULAR | Status: AC
  Administered 2016-11-09: 25 mg via INTRAVENOUS

## 2016-11-09 MED ORDER — HEPARIN (PORCINE) IN NACL 2-0.9 UNIT/ML-% IJ SOLN
INTRAMUSCULAR | Status: AC | PRN
  Administered 2016-11-09: 1000 mL

## 2016-11-09 MED ORDER — NITROGLYCERIN 1 MG/10 ML FOR IR/CATH LAB
INTRA_ARTERIAL | Status: DC | PRN
  Administered 2016-11-09 (×2): 200 ug via INTRACORONARY

## 2016-11-09 MED ORDER — SODIUM CHLORIDE 0.9 % WEIGHT BASED INFUSION
3.0000 mL/kg/h | INTRAVENOUS | Status: DC
  Administered 2016-11-09: 10:00:00 via INTRAVENOUS

## 2016-11-09 MED ORDER — INSULIN ASPART 100 UNIT/ML ~~LOC~~ SOLN
0.0000 [IU] | Freq: Three times a day (TID) | SUBCUTANEOUS | Status: DC
  Administered 2016-11-09 (×2): 5 [IU] via SUBCUTANEOUS

## 2016-11-09 MED ORDER — IOPAMIDOL (ISOVUE-370) INJECTION 76%
INTRAVENOUS | Status: DC | PRN
  Administered 2016-11-09: 210 mL via INTRAVENOUS

## 2016-11-09 MED ORDER — SODIUM CHLORIDE 0.9% FLUSH: 3.0000 mL | INTRAVENOUS | Status: DC | PRN

## 2016-11-09 MED ORDER — ROSUVASTATIN CALCIUM 20 MG PO TABS: 20.0000 mg | ORAL_TABLET | Freq: Every day | ORAL | Status: DC

## 2016-11-09 MED ORDER — LIDOCAINE HCL (PF) 1 % IJ SOLN
INTRAMUSCULAR | Status: AC
  Filled 2016-11-09: qty 30

## 2016-11-09 MED ORDER — INSULIN ASPART 100 UNIT/ML ~~LOC~~ SOLN: 0.0000 [IU] | Freq: Every day | SUBCUTANEOUS | Status: DC

## 2016-11-09 MED ORDER — FENTANYL CITRATE (PF) 100 MCG/2ML IJ SOLN
INTRAMUSCULAR | Status: DC | PRN
  Administered 2016-11-09 (×2): 50 ug via INTRAVENOUS

## 2016-11-09 MED ORDER — SODIUM CHLORIDE 0.9 % WEIGHT BASED INFUSION: 1.0000 mL/kg/h | INTRAVENOUS | Status: DC

## 2016-11-09 MED ORDER — MIDAZOLAM HCL 2 MG/2ML IJ SOLN
INTRAMUSCULAR | Status: AC
  Filled 2016-11-09: qty 2

## 2016-11-09 MED ORDER — ACETAMINOPHEN 325 MG PO TABS: 650.0000 mg | ORAL_TABLET | ORAL | Status: DC | PRN

## 2016-11-09 MED ORDER — VERAPAMIL HCL 2.5 MG/ML IV SOLN
INTRAVENOUS | Status: AC
  Filled 2016-11-09: qty 2

## 2016-11-09 MED ORDER — SODIUM CHLORIDE 0.9 % IV SOLN: INTRAVENOUS | Status: AC

## 2016-11-09 MED ORDER — ASPIRIN 81 MG PO CHEW
CHEWABLE_TABLET | ORAL | Status: AC
  Filled 2016-11-09: qty 1

## 2016-11-09 MED ORDER — INSULIN ASPART 100 UNIT/ML FLEXPEN: 3.0000 [IU] | PEN_INJECTOR | Freq: Three times a day (TID) | SUBCUTANEOUS | Status: DC

## 2016-11-09 MED ORDER — CARVEDILOL 3.125 MG PO TABS: 6.2500 mg | ORAL_TABLET | Freq: Two times a day (BID) | ORAL | Status: DC

## 2016-11-09 MED ORDER — VERAPAMIL HCL 2.5 MG/ML IV SOLN
INTRAVENOUS | Status: DC | PRN
  Administered 2016-11-09: 3 mL via INTRA_ARTERIAL

## 2016-11-09 MED ORDER — CLOPIDOGREL BISULFATE 75 MG PO TABS
ORAL_TABLET | ORAL | Status: AC
  Filled 2016-11-09: qty 2

## 2016-11-09 MED ORDER — HYDRALAZINE HCL 20 MG/ML IJ SOLN: 5.0000 mg | INTRAMUSCULAR | Status: AC | PRN

## 2016-11-09 MED ORDER — LISINOPRIL 20 MG PO TABS: 20.0000 mg | ORAL_TABLET | Freq: Every day | ORAL | Status: DC

## 2016-11-09 SURGICAL SUPPLY — 17 items
BALLN EMERGE MR 2.0X12 (BALLOONS) ×3
BALLOON EMERGE MR 2.0X12 (BALLOONS) IMPLANT
CATH IMPULSE 5F ANG/FL3.5 (CATHETERS) ×1 IMPLANT
CATH VISTA GUIDE 6FR XBLAD3.0 (CATHETERS) ×1 IMPLANT
COVER PRB 48X5XTLSCP FOLD TPE (BAG) IMPLANT
COVER PROBE 5X48 (BAG) ×3
DEVICE RAD COMP TR BAND LRG (VASCULAR PRODUCTS) ×1 IMPLANT
GLIDESHEATH SLEND A-KIT 6F 22G (SHEATH) ×1 IMPLANT
GUIDEWIRE INQWIRE 1.5J.035X260 (WIRE) IMPLANT
INQWIRE 1.5J .035X260CM (WIRE) ×3
KIT ENCORE 26 ADVANTAGE (KITS) ×1 IMPLANT
KIT HEART LEFT (KITS) ×3 IMPLANT
PACK CARDIAC CATHETERIZATION (CUSTOM PROCEDURE TRAY) ×3 IMPLANT
STENT RESOLUTE ONYX 2.25X15 (Permanent Stent) ×1 IMPLANT
TRANSDUCER W/STOPCOCK (MISCELLANEOUS) ×3 IMPLANT
TUBING CIL FLEX 10 FLL-RA (TUBING) ×3 IMPLANT
WIRE ASAHI PROWATER 180CM (WIRE) ×1 IMPLANT

## 2016-11-09 NOTE — Progress Notes (Signed)
CARDIAC REHAB PHASE I   Pt s/p PCI for same day discharge, last seen by cardiac rehab 5/18. Completed PCI/stent education with pt and mother at bedside. Reviewed risk factors, anti-platelet therapy, stent card, activity restrictions, ntg, exercise, heart healthy and diabetes diet handouts and phase 2 cardiac rehab. Pt verbalized understanding, very receptive. Pt agrees to phase 2 cardiac rehab referral, will send to [facility]. Pt in recliner, call bell within reach.    [phone] Joylene Grapes, RN, BSN 11/09/2016 3:02 PM

## 2016-11-09 NOTE — Interval H&P Note (Signed)
Cath Lab Visit (complete for each Cath Lab visit)  Clinical Evaluation Leading to the Procedure:   ACS: No.  Non-ACS:    Anginal Classification: CCS III  Anti-ischemic medical therapy: Minimal Therapy (1 class of medications)  Non-Invasive Test Results: No non-invasive testing performed  Prior CABG: No previous CABG      History and Physical Interval Note:  11/09/2016 12:01 PM  Brittney Banks  has presented today for surgery, with the diagnosis of cad  The various methods of treatment have been discussed with the patient and family. After consideration of risks, benefits and other options for treatment, the patient has consented to  Procedure(s): Coronary Stent Intervention (N/A) as a surgical intervention .  The patient's history has been reviewed, patient examined, no change in status, stable for surgery.  I have reviewed the patient's chart and labs.  Questions were answered to the patient's satisfaction.     Lyn Records III

## 2016-11-09 NOTE — Discharge Instructions (Signed)

## 2016-11-09 NOTE — Discharge Summary (Signed)
The patient has been seen in conjunction with Reino Bellis, NP-C. All aspects of care have been considered and discussed. The patient has been personally interviewed, examined, and all clinical data has been reviewed.   Uneventful PCI of the mid LAD treated with drug-eluting stent without complications.  Access site is unremarkable.  Patient is ambulated postprocedure without chest discomfort.  Appropriate to discharge as a same-day PCI.    Discharge Summary    Patient ID: Brittney Banks,  MRN: [id number], DOB/AGE: Jul 10, 1970 46 y.o.  Admit date: 11/09/2016 Discharge date: 11/09/2016  Primary Care Provider: Ronita Hipps Primary Cardiologist: Tamala Julian   Discharge Diagnoses    Principal Problem:   Coronary artery disease involving native coronary artery of native heart with angina pectoris [facility]) Active Problems:   Diabetes mellitus type 2 in obese ([place])   HTN (hypertension)   ST elevation myocardial infarction (STEMI) of inferior wall (HCC)   Allergies Allergies  Allergen Reactions  . Anaprox [Naproxen Sodium] Shortness Of Breath    Redness of skin and difficulty breathing Redness of skin and difficulty breathing  . Aspirin Nausea And Vomiting    Severe cramping, vomiting Severe cramping, vomiting  . Brilinta [Ticagrelor] Shortness Of Breath  . Ceftin [Cefuroxime Axetil] Shortness Of Breath    Skin turns red Skin turns red  . Celexa [Citalopram Hydrobromide] Shortness Of Breath    Breathing difficulty Breathing difficulty  . Ciprofloxacin Shortness Of Breath    Skin turns red Skin turns red  . Coconut Fragrance Hives, Shortness Of Breath and Swelling    Reaction to any kind of coconut Mouth and tongue swelling  . Contrast Media [Iodinated Diagnostic Agents] Shortness Of Breath    Breathing difficulty  . Cortisol [Hydrocortisone] Shortness Of Breath    Breathing difficulty and redness of skin Breathing difficulty and redness of skin  . Elavil  [Amitriptyline] Shortness Of Breath    Redness of skin and blisters of skin Difficulty breathing Redness of skin and blisters of skin Difficulty breathing  . Hctz [Hydrochlorothiazide] Shortness Of Breath  . Imitrex [Sumatriptan] Shortness Of Breath    Redness and difficulty breathing Redness and difficulty breathing  . Lexapro [Escitalopram Oxalate] Shortness Of Breath    Breathing difficulty Redness to skin Breathing difficulty Redness to skin  . Methadone Nausea And Vomiting    Severely vomiting Severely vomiting  . Metrizamide Shortness Of Breath    Breathing difficulty  . Motrin [Ibuprofen] Nausea And Vomiting  . Naprosyn [Naproxen] Shortness Of Breath    Breathing difficulty Redness of skin Breathing difficulty Redness of skin  . Neurontin [Gabapentin] Shortness Of Breath    Breathing difficulty  Redness to skin Breathing difficulty  Redness to skin  . Penicillins Shortness Of Breath    Redness to skin Has patient had a PCN reaction causing immediate rash, facial/tongue/throat swelling, SOB or lightheadedness with hypotension: Yes Has patient had a PCN reaction causing severe rash involving mucus membranes or skin necrosis: No Has patient had a PCN reaction that required hospitalization pt was in the hospital at time of reaction Has patient had a PCN reaction occurring within the last 10 years: No If all of the above answers are "NO", then may proceed with Cephalosporin use. Redness to skin Has patient had a PCN reaction causing immediate rash, facial/tongue/throat swelling, SOB or lightheadedness with hypotension: Yes Has patient had a PCN reaction causing severe rash involving mucus membranes or skin necrosis: No Has patient had a PCN reaction that  required hospitalization pt was in the hospital at time of reaction Has patient had a PCN reaction occurring within the last 10 years: No If all of the above answers are "NO", then may proceed with Cephalosporin use.  .  Sulfa Antibiotics Shortness Of Breath    Severe breathing difficulty And redness of skin  . Sulfasalazine Shortness Of Breath    Severe breathing difficulty And redness of skin  . Tagamet [Cimetidine] Nausea And Vomiting    Severe vomiting Severe vomiting  . Toradol [Ketorolac Tromethamine] Shortness Of Breath    Redness to skin Redness to skin  . Vancomycin Rash    Patient Confirms Red mans syndome type rxn - Tolerated a slow infusion in past  . Zoloft [Sertraline Hcl] Shortness Of Breath    Breathing difficulty Redness to skin Breathing difficulty Redness to skin  . Iodine Swelling    Redness to skin Redness to skin  . Lipitor [Atorvastatin] Diarrhea    Diagnostic Studies/Procedures    LHC: 11/09/16  Conclusion     Ost Cx to Prox Cx lesion, 50 %stenosed.    Widely patent stent in the native right coronary.\  90% stenosis in the mid LAD. The LAD is small in caliber and in the 2.0-2.25 mm range.  Widely patent circumflex with ostial 50% circumflex.  The left main is widely patent.  Successful mid LAD stent reducing 90% stenosis to 0% using a 2.25 mm x 15 mm Onyx DES dilated to 14 atm 2. TIMI grade 3 flow was noted.  RECOMMENDATIONS:   Continue Plavix indefinitely.  Discharge this evening if no complications and the patient is able to ambulate without problems.  Risk factor modification   _____________   History of Present Illness     Brittney Banks is a 46 y.o. female who presents for CAD with recent STEMI. She had emergent cath and found to have 100% RCA stenosis and had PCI with ONYX, DES and catheter based thrombectomy prior to stenting. Does have residual CAD. CAD residual with continued pain and this was discussed with Dr. Katrinka Blazing in the office and he reviewed films- the LAD is also a small vessel, will try medical management first. With pain addedimdur 30 mg po daily was added. This has helped has not needed NTG sl since.   She did not tolerate  Brilinta so changed to plavix and the SOB improved. She has pyoderma gangrenosum and is on steroids N, V &.Diarrhea on lipitor 80 we changed to Crestor 20 mg and no further N,V or diarrhea.    At her last office visit on 10/17/16 reported she still had angina more with exertion despite imdur twice a day. Now most of pain was only in her back, does not last long 30 sec to 1 min and if she stopped it would resolve. Reported SOB and diaphoresis with it but no nausea. She has only needed NTG twice in last 2 weeks prior to appt. Given her persistent symptoms she was referred to outpatient cath for possible PCI to the LAD.   Hospital Course     She underwent cardiac catheterization with Dr. Katrinka Blazing with successful PCI and DESx1 to the mLAD. Patient was already Plavix monotherapy (given ASA allergy) as an outpatient. She will be continued on this. No changes in her medications. No complications noted post cath. Radial cath site stable. Given post cath precautions/restrictions given. Follow up in the office has been arranged.  _____________  Discharge Vitals Blood pressure 110/68, pulse 83, temperature  98.7 F (37.1 C), temperature source Oral, resp. rate (!) 29, height '5\' 9"'$  (1.753 m), weight 280 lb (127 kg), SpO2 94 %.  Filed Weights   11/09/16 0939 11/09/16 1129  Weight: 280 lb (127 kg) 280 lb (127 kg)    Labs & Radiologic Studies    CBC  Recent Labs  11/07/16 1128  WBC 13.1*  NEUTROABS 8.1*  HGB 15.5  HCT 44.4  MCV 94  PLT 683   Basic Metabolic Panel  Recent Labs  11/07/16 1128  NA 136  K 4.2  CL 98  CO2 30*  GLUCOSE 339*  BUN 17  CREATININE 0.70  CALCIUM 9.7   Liver Function Tests  Recent Labs  11/07/16 1128  AST 20  ALT 42*  ALKPHOS 91  BILITOT 0.3  PROT 6.6  ALBUMIN 4.2   No results for input(s): LIPASE, AMYLASE in the last 72 hours. Cardiac Enzymes No results for input(s): CKTOTAL, CKMB, CKMBINDEX, TROPONINI in the last 72 hours. BNP Invalid input(s):  POCBNP D-Dimer No results for input(s): DDIMER in the last 72 hours. Hemoglobin A1C No results for input(s): HGBA1C in the last 72 hours. Fasting Lipid Panel  Recent Labs  11/07/16 1124  CHOL 125  HDL 37*  LDLCALC 47  TRIG 206*  CHOLHDL 3.4   Thyroid Function Tests No results for input(s): TSH, T4TOTAL, T3FREE, THYROIDAB in the last 72 hours.  Invalid input(s): FREET3 _____________  No results found. Disposition   Pt is being discharged home today in good condition.  Follow-up Plans & Appointments   Follow-up Information    Burtis Junes, NP Follow up on 11/21/2016.   Specialties:  Nurse Practitioner, Interventional Cardiology, Cardiology, Radiology Why:  at 10am for your follow up appt.  Contact information: [address]  [place] [phone] [phone]          Discharge Instructions    Amb Referral to Cardiac Rehabilitation    Complete by:  As directed    Diagnosis:  Coronary Stents Comment - to [place]     Discharge Medications   Current Discharge Medication List    CONTINUE these medications which have NOT CHANGED   Details  carvedilol (COREG) 6.25 MG tablet Take 1 tablet (6.25 mg total) by mouth 2 (two) times daily. Qty: 180 tablet, Refills: 3    cetirizine (ZYRTEC) 10 MG tablet Take 10 mg by mouth daily.    clopidogrel (PLAVIX) 75 MG tablet Take 1 tablet (75 mg total) by mouth daily. Qty: 35 tablet, Refills: 5    fluticasone (FLONASE) 50 MCG/ACT nasal spray Place 1 spray into both nostrils daily as needed for allergies.     insulin aspart (NOVOLOG FLEXPEN) 100 UNIT/ML FlexPen Inject 3-20 Units into the skin 3 (three) times daily with meals. CBG 70 - 120: 0 units  CBG 121 - 150: 3 units  CBG 151 - 200: 4 units  CBG 201 - 250: 7 units  CBG 251 - 300: 11 units  CBG 301 - 350: 15 units  CBG 351 - 400: 20 units Qty: 15 mL, Refills: 11    insulin detemir (LEVEMIR) 100 UNIT/ML injection Inject 0.2 mLs (20 Units total) into  the skin at bedtime. Qty: 10 mL, Refills: 11    isosorbide mononitrate (IMDUR) 30 MG 24 hr tablet Take 1 tablet (30 mg total) by mouth 2 (two) times daily. Qty: 60 tablet, Refills: 3    lisinopril (PRINIVIL,ZESTRIL) 20 MG tablet Take 1 tablet (20 mg total) by  mouth daily. Qty: 90 tablet, Refills: 3    metFORMIN (GLUCOPHAGE) 850 MG tablet Take 1 tablet (850 mg total) by mouth 3 (three) times daily with meals. Resume 48 hr after discharge.    methotrexate (RHEUMATREX) 2.5 MG tablet Take 12.5 mg by mouth every Sunday. Caution:Chemotherapy. Protect from light.    morphine (MS CONTIN) 60 MG 12 hr tablet Take 60 mg by mouth every 8 (eight) hours as needed for pain.    nitroGLYCERIN (NITROSTAT) 0.4 MG SL tablet Place 1 tablet (0.4 mg total) under the tongue every 5 (five) minutes x 3 doses as needed for chest pain. Qty: 25 tablet, Refills: 3    Oxycodone HCl 20 MG TABS Take 20 mg by mouth every 4 (four) hours as needed (pain).    predniSONE (DELTASONE) 5 MG tablet Take 5 mg by mouth daily with breakfast.     rosuvastatin (CRESTOR) 20 MG tablet Take 1 tablet (20 mg total) by mouth daily. Qty: 30 tablet, Refills: 5    glucose monitoring kit (FREESTYLE) monitoring kit 1 each by Does not apply route as needed for other. Dispense any model that is covered- dispense testing supplies for Q AC/ HS accuchecks- 1 month supply with one refil. Qty: 1 each, Refills: 1         Aspirin prescribed at discharge?  Yes High Intensity Statin Prescribed? (Lipitor 40-'80mg'$  or Crestor 20-'40mg'$ ): Yes Beta Blocker Prescribed? Yes For EF <40%, was ACEI/ARB Prescribed? Yes ADP Receptor Inhibitor Prescribed? (i.e. Plavix etc.-Includes Medically Managed Patients): Yes For EF <40%, Aldosterone Inhibitor Prescribed? No: EF ok Was EF assessed during THIS hospitalization? No: Recently assessed during hospitalization  Was Cardiac Rehab II ordered? (Included Medically managed Patients): Yes, already enrolled.      Outstanding Labs/Studies   N/a  Duration of Discharge Encounter   Greater than 30 minutes including physician time.  Signed, Reino Bellis NP-C 11/09/2016, 7:25 PM

## 2016-11-09 NOTE — H&P (View-Only) (Signed)
Cardiology Office Note   Date:  10/17/2016   ID:  Brittney Banks, DOB 03-Nov-1970, MRN [id number]  PCP:  Marylen Ponto, MD  Cardiologist:  Dr. Katrinka Blazing    Chief Complaint  Patient presents with  . Coronary Artery Disease      History of Present Illness: Brittney Banks is a 46 y.o. female who presents for CAD with recent STEMI.  She had emergent cath and found to have 100% RCA stenosis and had PCI with ONYX, DES and catheter based thrombectomy prior to stenting. Does have residual CAD. CAD residual with continued pain I discussed with Dr. Katrinka Blazing in the office and he reviewed films- the LAD is also a small vessel, will try medical management first. With pain I added imdur 30 mg po daily. This has helped has not needed NTG sl since.   do higher dose and eventually add ranexa if needed.   She did not tolerate Brilinta so changed to plavix and the SOB improved.   She has pyoderma gangrenosum and is on steroids  CXR with infiltrate and we added abx.  Zithromax and Zyvox. Today she has competed abx but now with fever.  Up to 101.5 last pm.   N, V &. Diarrhea on lipitor 80 we changed to Crestor 20 mg.  And no further N,V or diarrhea.    Last visit she had fever and went to ER and dx with viral process.   Today over all feels better with virus.  She still has angina more with exertion despite imdur twice a day. Now most of pain is only in her back, does not last long 30 sec to 1 min and if she stops it resolves.  Only in her back now when previously in chest and back.  + SOB and diaphoresis with it but no nausea.  She has only needed NTG twice in last 2 weeks.    No further diarrhea and SOB improved overall.     She does walk her dog 15 min 4 times a day.  With her pyoderma gangrenosum she has lesions on her legs.  She is to see derm on the 17th at [facility].     Past Medical History:  Diagnosis Date  . Allergy   . Behcet's disease [facility])    patient advised RN she has Behcet's disease  .  Cellulitis 03/2016  . Coronary artery disease   . DDD (degenerative disc disease), lumbar   . Diabetes mellitus without complication (HCC)    type 2  . Hyperlipidemia   . Hypertension   . LV dysfunction   . MRSA carrier 2008  . MRSA infection 2008   BOTH LEGS  . Myocardial infarction involving right coronary artery [facility])     Past Surgical History:  Procedure Laterality Date  . BILATERAL CARPAL TUNNEL RELEASE  2007  . CORONARY STENT INTERVENTION N/A 08/15/2016   Procedure: Coronary Stent Intervention;  Surgeon: Lyn Records, MD;  Location: [facility] INVASIVE CV LAB;  Service: Cardiovascular;  Laterality: N/A;  . ECTOPIC PREGNANCY SURGERY     2 SURGERIES  . LEFT HEART CATH AND CORONARY ANGIOGRAPHY N/A 08/15/2016   Procedure: Left Heart Cath and Coronary Angiography;  Surgeon: Lyn Records, MD;  Location: [facility] INVASIVE CV LAB;  Service: Cardiovascular;  Laterality: N/A;  . NECK SURGERY N/A 2008  . REPLACEMENT TOTAL KNEE BILATERAL Right   . TONSILLECTOMY       Current Outpatient Prescriptions  Medication Sig Dispense Refill  .  carvedilol (COREG) 6.25 MG tablet Take 1 tablet (6.25 mg total) by mouth 2 (two) times daily. 180 tablet 3  . cetirizine (ZYRTEC) 10 MG tablet Take 10 mg by mouth daily.    . clopidogrel (PLAVIX) 75 MG tablet Take 1 tablet (75 mg total) by mouth daily. 35 tablet 5  . collagenase (SANTYL) ointment Apply topically daily. 15 g 0  . fluticasone (FLONASE) 50 MCG/ACT nasal spray Place 1 spray into both nostrils daily.    Marland Kitchen glucose monitoring kit (FREESTYLE) monitoring kit 1 each by Does not apply route as needed for other. Dispense any model that is covered- dispense testing supplies for Q AC/ HS accuchecks- 1 month supply with one refil. 1 each 1  . insulin aspart (NOVOLOG FLEXPEN) 100 UNIT/ML FlexPen Inject 3-20 Units into the skin 3 (three) times daily with meals. CBG 70 - 120: 0 units  CBG 121 - 150: 3 units  CBG 151 - 200: 4 units  CBG 201 - 250: 7 units  CBG 251 -  300: 11 units  CBG 301 - 350: 15 units  CBG 351 - 400: 20 units 15 mL 11  . insulin detemir (LEVEMIR) 100 UNIT/ML injection Inject 0.2 mLs (20 Units total) into the skin at bedtime. 10 mL 11  . isosorbide mononitrate (IMDUR) 30 MG 24 hr tablet Take 1 tablet (30 mg total) by mouth 2 (two) times daily. 60 tablet 3  . lisinopril (PRINIVIL,ZESTRIL) 20 MG tablet Take 1 tablet (20 mg total) by mouth daily. 90 tablet 3  . metFORMIN (GLUCOPHAGE) 850 MG tablet Take 1 tablet (850 mg total) by mouth 3 (three) times daily with meals. Resume 48 hr after discharge.    . methotrexate (RHEUMATREX) 2.5 MG tablet Take 12.5 mg by mouth every Sunday. Caution:Chemotherapy. Protect from light.    . morphine (MS CONTIN) 60 MG 12 hr tablet Take 60 mg by mouth every 8 (eight) hours as needed for pain.    . nitroGLYCERIN (NITROSTAT) 0.4 MG SL tablet Place 1 tablet (0.4 mg total) under the tongue every 5 (five) minutes x 3 doses as needed for chest pain. 25 tablet 3  . omeprazole (PRILOSEC OTC) 20 MG tablet Take 20 mg by mouth daily.    . Oxycodone HCl 20 MG TABS Take 20 mg by mouth every 4 (four) hours as needed (pain).    . predniSONE (DELTASONE) 10 MG tablet Take 10 mg by mouth 2 (two) times daily with a meal.     . rosuvastatin (CRESTOR) 20 MG tablet Take 1 tablet (20 mg total) by mouth daily. 30 tablet 5   No current facility-administered medications for this visit.     Allergies:   Anaprox [naproxen sodium]; Aspirin; Brilinta [ticagrelor]; Ceftin [cefuroxime axetil]; Celexa [citalopram hydrobromide]; Ciprofloxacin; Coconut fragrance; Contrast media [iodinated diagnostic agents]; Cortisol [hydrocortisone]; Elavil [amitriptyline]; Hctz [hydrochlorothiazide]; Imitrex [sumatriptan]; Lexapro [escitalopram oxalate]; Methadone; Metrizamide; Motrin [ibuprofen]; Naprosyn [naproxen]; Neurontin [gabapentin]; Penicillins; Sulfa antibiotics; Sulfasalazine; Tagamet [cimetidine]; Toradol [ketorolac tromethamine]; Vancomycin; Zoloft  [sertraline hcl]; Iodine; and Lipitor [atorvastatin]    Social History:  The patient  reports that she quit smoking about 6 years ago. She has never used smokeless tobacco. She reports that she does not drink alcohol or use drugs.   Family History:  The patient's She was adopted. Family history is unknown by patient.    ROS:  General:no further colds or fevers, no weight changes Skin:no rashes + chronic ulcers HEENT:no blurred vision, no congestion CV:see HPI PUL:see HPI GI:no diarrhea constipation  or melena, no indigestion GU:no hematuria, no dysuria MS:no joint pain, no claudication Neuro:no syncope, no lightheadedness Endo:+ diabetes stable, no thyroid disease  Wt Readings from Last 3 Encounters:  10/17/16 299 lb 9.6 oz (135.9 kg)  09/27/16 298 lb 6.4 oz (135.4 kg)  09/05/16 (!) 302 lb 6.4 oz (137.2 kg)     PHYSICAL EXAM: VS:  BP 122/84   Pulse (!) 104   Ht '5\' 9"'$  (1.753 m)   Wt 299 lb 9.6 oz (135.9 kg)   LMP  (LMP Unknown)   BMI 44.24 kg/m  , BMI Body mass index is 44.24 kg/m. General:Pleasant affect, NAD Skin:Warm and dry, brisk capillary refill, multiple ulcers on arms and legs, legs wrapped HEENT:normocephalic, sclera clear, mucus membranes moist Neck:supple, no JVD, no bruits  Heart:S1S2 RRR without murmur, gallup, rub or click Lungs:clear without rales, rhonchi, or wheezes XTK:WIOXB,DZHG, non tender, + BS, do not palpate liver spleen or masses Ext:no lower ext edema, 2+ pedal pulses, 2+ radial pulses Neuro:alert and oriented X 3, MAE, follows commands, + facial symmetry    EKG:  EKG is not ordered today.    Recent Labs: 08/15/2016: ALT 55; B Natriuretic Peptide 227.0; TSH 0.504 09/27/2016: BUN 13; Creatinine, Ser 0.51; Hemoglobin 15.4; Platelets 181; Potassium 4.5; Sodium 138    Lipid Panel    Component Value Date/Time   CHOL 200 08/16/2016 0342   TRIG 122 08/16/2016 0342   HDL 29 (L) 08/16/2016 0342   CHOLHDL 6.9 08/16/2016 0342   VLDL 24  08/16/2016 0342   LDLCALC 147 (H) 08/16/2016 0342       Other studies Reviewed: Additional studies/ records that were reviewed today include: . Belva Crome, MD (Primary)    Procedures   Coronary Stent Intervention  Left Heart Cath and Coronary Angiography  Conclusion    Acute inferior ST elevation myocardial infarction commencing approximately 18 hours prior to admission. Ongoing significant chest discomfort with ST elevation noted on EKG.  Total occlusion in the mid RCA of what is a dominant vessel that supplies the entire inferior wall and apical LV.  80-90% diffuse disease in the mid LAD which is a relatively small vessel.  Luminal irregularities noted in a large diagonal of the LAD.  Ostial to proximal 80% stenosis in the circumflex which is a relatively small vessel.  Irregularities within the ramus intermedius but no high-grade obstruction is noted. Less than 50% obstruction.  Successful PTCA and stent of the mid right coronary from 100% to 0% using an Onyx 4.5 x 22 mm drug-eluting stent postdilated with a 4.5 x 15 mm balloon. Catheter based thrombectomy was applied prior to stenting. TIMI grade 3 flow noted post procedure. Very distal PDA and left ventricular branch embolic obstruction.  The patient is allergic to aspirin.  Recommendations:  Brilinta times at least 6 months before switching to Plavix to complete at least one year, but I would recommend indefinite since the patient is unable to use aspirin.  IV Aggrastat 18 hours.  Beta blocker therapy.  High intensity statin therapy.  Consider staged intervention on the mid LAD diffuse disease. Team should review pictures to determine if this should be done prior to discharge.   Coronary Diagrams   Diagnostic Diagram       Post-Intervention Diagram        ECHO 08/17/16 Study Conclusions  - Left ventricle: Mild distal septal and inferior wall hypokinesis.   The cavity size was normal. Wall  thickness was increased in a  pattern of mild LVH. Systolic function was normal. The estimated   ejection fraction was in the range of 50% to 55%. Wall motion was   normal; there were no regional wall motion abnormalities. - Atrial septum: No defect or patent foramen ovale was identified. - Impressions: GLS -14.9 lower limit normal 15.  Impressions:  - GLS -14.9 lower limit normal 15.  ASSESSMENT AND PLAN:  1.  Continued angina despite increasing Imdur.  Discussed with Dr. Tamala Julian who reviewed the films and felt though small vessel there was a focal lesion.  Will proceed to cath.  Discussed with pt and her mother.  With their schedule and Dr. Thompson Caul best date was 7/25 she will have labs done that week on 23rd including lipids and CMP. Discussed cardiac cath with both pt and her mother.    The patient understands that risks included but are not limited to stroke (1 in 1000), death (1 in 23), kidney failure [usually temporary] (1 in 500), bleeding (1 in 200), allergic reaction [possibly serious] (1 in 200).   2. STEMI of RCA in 5.2018 - tolerating plavix, with allergy to ASA.  On BB, statin   SOB with Brilinta  3. CAD see #1.  HLD on crestor will check lipids when she has pre cath labs drawn.  4.  PNA has resolved.   5. Mild LV dysfunction on ACE  6. Skin ulcers related to pyoderma gangrenosa.  7.  Multiple allergies.  8. DM-2 per PCP  Current medicines are reviewed with the patient today.  The patient Has no concerns regarding medicines.  The following changes have been made:  See above Labs/ tests ordered today include:see above  Disposition:   FU:  see above  Signed, Cecilie Kicks, NP  10/17/2016 6:05 PM    [facility] [place], [place], [place], [place] Phone: [phone]; Fax: [phone]

## 2016-11-10 ENCOUNTER — Encounter (HOSPITAL_COMMUNITY): Payer: Self-pay | Admitting: Interventional Cardiology

## 2016-11-10 DIAGNOSIS — I25119 Atherosclerotic heart disease of native coronary artery with unspecified angina pectoris: Secondary | ICD-10-CM | POA: Diagnosis not present

## 2016-11-10 DIAGNOSIS — I2111 ST elevation (STEMI) myocardial infarction involving right coronary artery: Secondary | ICD-10-CM | POA: Diagnosis not present

## 2016-11-10 MED FILL — Clopidogrel Bisulfate Tab 75 MG (Base Equiv): ORAL | Qty: 3 | Status: AC

## 2016-11-14 DIAGNOSIS — I25119 Atherosclerotic heart disease of native coronary artery with unspecified angina pectoris: Secondary | ICD-10-CM | POA: Diagnosis not present

## 2016-11-14 DIAGNOSIS — I2111 ST elevation (STEMI) myocardial infarction involving right coronary artery: Secondary | ICD-10-CM | POA: Diagnosis not present

## 2016-11-15 DIAGNOSIS — M533 Sacrococcygeal disorders, not elsewhere classified: Secondary | ICD-10-CM | POA: Diagnosis not present

## 2016-11-15 DIAGNOSIS — M47812 Spondylosis without myelopathy or radiculopathy, cervical region: Secondary | ICD-10-CM | POA: Diagnosis not present

## 2016-11-15 DIAGNOSIS — M47817 Spondylosis without myelopathy or radiculopathy, lumbosacral region: Secondary | ICD-10-CM | POA: Diagnosis not present

## 2016-11-15 DIAGNOSIS — M5416 Radiculopathy, lumbar region: Secondary | ICD-10-CM | POA: Diagnosis not present

## 2016-11-18 DIAGNOSIS — I2111 ST elevation (STEMI) myocardial infarction involving right coronary artery: Secondary | ICD-10-CM | POA: Diagnosis not present

## 2016-11-18 DIAGNOSIS — I25119 Atherosclerotic heart disease of native coronary artery with unspecified angina pectoris: Secondary | ICD-10-CM | POA: Diagnosis not present

## 2016-11-21 ENCOUNTER — Ambulatory Visit (INDEPENDENT_AMBULATORY_CARE_PROVIDER_SITE_OTHER): Payer: Medicare Other | Admitting: Nurse Practitioner

## 2016-11-21 ENCOUNTER — Encounter: Payer: Self-pay | Admitting: Nurse Practitioner

## 2016-11-21 VITALS — BP 118/76 | HR 90 | Ht 69.0 in | Wt 307.0 lb

## 2016-11-21 DIAGNOSIS — I2111 ST elevation (STEMI) myocardial infarction involving right coronary artery: Secondary | ICD-10-CM | POA: Diagnosis not present

## 2016-11-21 DIAGNOSIS — M352 Behcet's disease: Secondary | ICD-10-CM | POA: Diagnosis not present

## 2016-11-21 DIAGNOSIS — I10 Essential (primary) hypertension: Secondary | ICD-10-CM

## 2016-11-21 DIAGNOSIS — I25119 Atherosclerotic heart disease of native coronary artery with unspecified angina pectoris: Secondary | ICD-10-CM | POA: Diagnosis not present

## 2016-11-21 DIAGNOSIS — Z955 Presence of coronary angioplasty implant and graft: Secondary | ICD-10-CM

## 2016-11-21 DIAGNOSIS — I2510 Atherosclerotic heart disease of native coronary artery without angina pectoris: Secondary | ICD-10-CM

## 2016-11-21 LAB — BASIC METABOLIC PANEL
BUN/Creatinine Ratio: 18 (ref 9–23)
BUN: 13 mg/dL (ref 6–24)
CO2: 22 mmol/L (ref 20–29)
Calcium: 8.4 mg/dL — ABNORMAL LOW (ref 8.7–10.2)
Chloride: 100 mmol/L (ref 96–106)
Creatinine, Ser: 0.73 mg/dL (ref 0.57–1.00)
GFR calc Af Amer: 115 mL/min/{1.73_m2} (ref >59)
GFR calc non Af Amer: 100 mL/min/{1.73_m2} (ref >59)
Glucose: 254 mg/dL — ABNORMAL HIGH (ref 65–99)
Potassium: 4 mmol/L (ref 3.5–5.2)
Sodium: 136 mmol/L (ref 134–144)

## 2016-11-21 LAB — CBC
Hematocrit: 41.3 % (ref 34.0–46.6)
Hemoglobin: 14.8 g/dL (ref 11.1–15.9)
MCH: 32.9 pg (ref 26.6–33.0)
MCHC: 35.8 g/dL — ABNORMAL HIGH (ref 31.5–35.7)
MCV: 92 fL (ref 79–97)
Platelets: 187 10*3/uL (ref 150–379)
RBC: 4.5 x10E6/uL (ref 3.77–5.28)
RDW: 13.4 % (ref 12.3–15.4)
WBC: 11.5 10*3/uL — ABNORMAL HIGH (ref 3.4–10.8)

## 2016-11-21 NOTE — Progress Notes (Signed)
CARDIOLOGY OFFICE NOTE  Date:  11/21/2016    Brittney Banks Date of Birth: August 07, 1970 Medical Record #[id number]  PCP:  Ronita Hipps, MD  Cardiologist:  Tamala Julian  Chief Complaint  Patient presents with  . Coronary Artery Disease    Post hospital visit - seen for Dr. Tamala Julian    History of Present Illness: Brittney Banks is a 46 y.o. female who presents today for a post hospital visit. Seen for Dr. Tamala Julian.   She has known CAD with recent STEMI back in April of 2018. She had emergent cath and found to have 100% RCA stenosis and had PCI with ONYX, DES and catheter based thrombectomy prior to stenting. Noted to have residual CAD. Seen several times by Cecilie Kicks, NP in follow up. She had continued pain and this was discussed with Dr. Tamala Julian in the office and he reviewed films - the LAD was asmall vessel - medical management was attempted first. Imdur was added - she did not tolerate. Also changed to Plavix from Brilinta due to SOB. She has had to have her dose of Lipitor cut back due to N, V, & D.   Her other issues include pyoderma gangrenosum and is on chronic steroids, HTN, HLD, DM and Behcet's disease. Multiple medication allergies noted.    Given her persistent symptoms she was referred to outpatient cath for possible PCI to the LAD. She underwent cardiac catheterization with Dr. Tamala Julian with successful PCI and DESx1 to the mLAD. Patient was already Plavix monotherapy (given ASA allergy) as an outpatient and this was continued.   Comes in today. Here with her mother. She notes that she is better - her back pain is resolved. Still with a few twinges "in the front" but overall, she notes 75% improvement in regards to chest pain. Has had some return of cough/wheezing and feeling hoarse. Concerned about recurrent pneumonia. Home health is to see her today and follow up with her PCP. She is very frustrated about her skin and auto immune issues. Feels like she needs to see rheumatology - apparently  never has. Sugars remain uncontrolled. She is on chronic Prednisone. Struggles with weight. Multiple open lesions over her body noted.   Past Medical History:  Diagnosis Date  . Allergy   . Behcet's disease [facility])    patient advised RN she has Behcet's disease  . Cellulitis 03/2016  . Coronary artery disease    11/09/16 PCI with DES to mLAD, patent RCA stent  . DDD (degenerative disc disease), lumbar   . Diabetes mellitus without complication ([place])    type 2  . Hyperlipidemia   . Hypertension   . LV dysfunction   . MRSA carrier 2008  . MRSA infection 2008   BOTH LEGS  . Myocardial infarction involving right coronary artery [facility])     Past Surgical History:  Procedure Laterality Date  . BILATERAL CARPAL TUNNEL RELEASE  2007  . CORONARY STENT INTERVENTION N/A 08/15/2016   Procedure: Coronary Stent Intervention;  Surgeon: Belva Crome, MD;  Location: [place] [facility];  Service: Cardiovascular;  Laterality: N/A;  . CORONARY STENT INTERVENTION N/A 11/09/2016   Procedure: Coronary Stent Intervention;  Surgeon: Belva Crome, MD;  Location: [place] [facility];  Service: Cardiovascular;  Laterality: N/A;  . ECTOPIC PREGNANCY SURGERY     2 SURGERIES  . LEFT HEART CATH AND CORONARY ANGIOGRAPHY N/A 08/15/2016   Procedure: Left Heart Cath and Coronary Angiography;  Surgeon: Belva Crome,  MD;  Location: [place] [facility];  Service: Cardiovascular;  Laterality: N/A;  . LEFT HEART CATH AND CORONARY ANGIOGRAPHY N/A 11/09/2016   Procedure: Left Heart Cath and Coronary Angiography;  Surgeon: Belva Crome, MD;  Location: [place] [facility];  Service: Cardiovascular;  Laterality: N/A;  . NECK SURGERY N/A 2008  . REPLACEMENT TOTAL KNEE BILATERAL Right   . TONSILLECTOMY    . ULTRASOUND GUIDANCE FOR VASCULAR ACCESS  11/09/2016   Procedure: Ultrasound Guidance For Vascular Access;  Surgeon: Belva Crome, MD;  Location: [place] [facility];  Service: Cardiovascular;;     Medications: Current Meds   Medication Sig  . benzonatate (TESSALON) 200 MG capsule Take 1 Capsule 3 times per day for cough  . carvedilol (COREG) 6.25 MG tablet Take 1 tablet (6.25 mg total) by mouth 2 (two) times daily.  . cetirizine (ZYRTEC) 10 MG tablet Take 10 mg by mouth daily.  . clopidogrel (PLAVIX) 75 MG tablet Take 1 tablet (75 mg total) by mouth daily.  . fluticasone (FLONASE) 50 MCG/ACT nasal spray Place 1 spray into both nostrils daily as needed for allergies.   Marland Kitchen glucose monitoring kit (FREESTYLE) monitoring kit 1 each by Does not apply route as needed for other. Dispense any model that is covered- dispense testing supplies for Q AC/ HS accuchecks- 1 month supply with one refil.  . insulin aspart (NOVOLOG FLEXPEN) 100 UNIT/ML FlexPen Inject 3-20 Units into the skin 3 (three) times daily with meals. CBG 70 - 120: 0 units  CBG 121 - 150: 3 units  CBG 151 - 200: 4 units  CBG 201 - 250: 7 units  CBG 251 - 300: 11 units  CBG 301 - 350: 15 units  CBG 351 - 400: 20 units  . insulin detemir (LEVEMIR) 100 UNIT/ML injection Inject 0.2 mLs (20 Units total) into the skin at bedtime.  . isosorbide mononitrate (IMDUR) 30 MG 24 hr tablet Take 1 tablet (30 mg total) by mouth 2 (two) times daily.  Marland Kitchen lisinopril (PRINIVIL,ZESTRIL) 20 MG tablet Take 1 tablet (20 mg total) by mouth daily.  . metFORMIN (GLUCOPHAGE) 850 MG tablet Take 1 tablet (850 mg total) by mouth 3 (three) times daily with meals. Resume 48 hr after discharge.  . methotrexate (RHEUMATREX) 2.5 MG tablet Take 12.5 mg by mouth every Sunday. Caution:Chemotherapy. Protect from light.  . morphine (MS CONTIN) 60 MG 12 hr tablet Take 60 mg by mouth every 8 (eight) hours as needed for pain.  . nitroGLYCERIN (NITROSTAT) 0.4 MG SL tablet Place 1 tablet (0.4 mg total) under the tongue every 5 (five) minutes x 3 doses as needed for chest pain.  . Oxycodone HCl 20 MG TABS Take 20 mg by mouth every 4 (four) hours as needed (pain).  . predniSONE (DELTASONE) 5 MG tablet Take  5 mg by mouth daily with breakfast.   . rosuvastatin (CRESTOR) 20 MG tablet Take 1 tablet (20 mg total) by mouth daily.  . silver sulfADIAZINE (SILVADENE) 1 % cream apply ONE gram TO SKIN ulcerations ON legs daily     Allergies: Allergies  Allergen Reactions  . Anaprox [Naproxen Sodium] Shortness Of Breath    Redness of skin and difficulty breathing Redness of skin and difficulty breathing  . Aspirin Nausea And Vomiting    Severe cramping, vomiting Severe cramping, vomiting  . Brilinta [Ticagrelor] Shortness Of Breath  . Ceftin [Cefuroxime Axetil] Shortness Of Breath    Skin turns red Skin turns red  . Celexa [  Citalopram Hydrobromide] Shortness Of Breath    Breathing difficulty Breathing difficulty  . Ciprofloxacin Shortness Of Breath    Skin turns red Skin turns red  . Coconut Fragrance Hives, Shortness Of Breath and Swelling    Reaction to any kind of coconut Mouth and tongue swelling  . Contrast Media [Iodinated Diagnostic Agents] Shortness Of Breath    Breathing difficulty  . Cortisol [Hydrocortisone] Shortness Of Breath    Breathing difficulty and redness of skin Breathing difficulty and redness of skin  . Elavil [Amitriptyline] Shortness Of Breath    Redness of skin and blisters of skin Difficulty breathing Redness of skin and blisters of skin Difficulty breathing  . Hctz [Hydrochlorothiazide] Shortness Of Breath  . Imitrex [Sumatriptan] Shortness Of Breath    Redness and difficulty breathing Redness and difficulty breathing  . Lexapro [Escitalopram Oxalate] Shortness Of Breath    Breathing difficulty Redness to skin Breathing difficulty Redness to skin  . Methadone Nausea And Vomiting    Severely vomiting Severely vomiting  . Metrizamide Shortness Of Breath    Breathing difficulty  . Motrin [Ibuprofen] Nausea And Vomiting  . Naprosyn [Naproxen] Shortness Of Breath    Breathing difficulty Redness of skin Breathing difficulty Redness of skin  . Neurontin  [Gabapentin] Shortness Of Breath    Breathing difficulty  Redness to skin Breathing difficulty  Redness to skin  . Penicillins Shortness Of Breath    Redness to skin Has patient had a PCN reaction causing immediate rash, facial/tongue/throat swelling, SOB or lightheadedness with hypotension: Yes Has patient had a PCN reaction causing severe rash involving mucus membranes or skin necrosis: No Has patient had a PCN reaction that required hospitalization pt was in the hospital at time of reaction Has patient had a PCN reaction occurring within the last 10 years: No If all of the above answers are "NO", then may proceed with Cephalosporin use. Redness to skin Has patient had a PCN reaction causing immediate rash, facial/tongue/throat swelling, SOB or lightheadedness with hypotension: Yes Has patient had a PCN reaction causing severe rash involving mucus membranes or skin necrosis: No Has patient had a PCN reaction that required hospitalization pt was in the hospital at time of reaction Has patient had a PCN reaction occurring within the last 10 years: No If all of the above answers are "NO", then may proceed with Cephalosporin use.  . Sulfa Antibiotics Shortness Of Breath    Severe breathing difficulty And redness of skin  . Sulfasalazine Shortness Of Breath    Severe breathing difficulty And redness of skin  . Tagamet [Cimetidine] Nausea And Vomiting    Severe vomiting Severe vomiting  . Toradol [Ketorolac Tromethamine] Shortness Of Breath    Redness to skin Redness to skin  . Vancomycin Rash    Patient Confirms Red mans syndome type rxn - Tolerated a slow infusion in past  . Zoloft [Sertraline Hcl] Shortness Of Breath    Breathing difficulty Redness to skin Breathing difficulty Redness to skin  . Iodine Swelling    Redness to skin Redness to skin  . Lipitor [Atorvastatin] Diarrhea    Social History: The patient  reports that she quit smoking about 6 years ago. She has never  used smokeless tobacco. She reports that she does not drink alcohol or use drugs.   Family History: The patient's She was adopted. Family history is unknown by patient.   Review of Systems: Please see the history of present illness.   Otherwise, the review of systems is  positive for none.   All other systems are reviewed and negative.   Physical Exam: VS:  BP 118/76 (BP Location: Left Arm, Patient Position: Sitting, Cuff Size: Large)   Pulse 90   Ht '5\' 9"'$  (1.753 m)   Wt (!) 307 lb (139.3 kg)   LMP  (LMP Unknown)   BMI 45.34 kg/m  .  BMI Body mass index is 45.34 kg/m.  Wt Readings from Last 3 Encounters:  11/21/16 (!) 307 lb (139.3 kg)  11/09/16 280 lb (127 kg)  10/17/16 299 lb 9.6 oz (135.9 kg)    General: Chronically ill. Looks much older than her stated age. She is alert and in no acute distress.   HEENT: Normal but with very poor dentition.  Neck: Supple, no JVD, carotid bruits, or masses noted.  Cardiac: Regular rate and rhythm. No murmurs, rubs, or gallops. Legs are quite full. Respiratory:  Lungs are clear to auscultation bilaterally with normal work of breathing.  GI: Soft and nontender.  MS: No deformity or atrophy. Gait and ROM intact.  Skin: Warm and dry. Color is normal. Multiple skin lesions in various stages of healing noted.  Neuro:  Strength and sensation are intact and no gross focal deficits noted.  Psych: Alert, appropriate and with normal affect.   LABORATORY DATA:  EKG:  EKG is ordered today. This shows NSR with inferior infarct - no acute changes.   Lab Results  Component Value Date   WBC 13.1 (H) 11/07/2016   HGB 15.5 11/07/2016   HCT 44.4 11/07/2016   PLT 219 11/07/2016   GLUCOSE 339 (H) 11/07/2016   CHOL 125 11/07/2016   TRIG 206 (H) 11/07/2016   HDL 37 (L) 11/07/2016   LDLCALC 47 11/07/2016   ALT 42 (H) 11/07/2016   AST 20 11/07/2016   NA 136 11/07/2016   K 4.2 11/07/2016   CL 98 11/07/2016   CREATININE 0.70 11/07/2016   BUN 17  11/07/2016   CO2 30 (H) 11/07/2016   TSH 0.504 08/15/2016   INR 1.0 11/07/2016   HGBA1C 10.8 (H) 08/15/2016     BNP (last 3 results)  Recent Labs  08/15/16 2103  BNP 227.0*    ProBNP (last 3 results) No results for input(s): PROBNP in the last 8760 hours.   Other Studies Reviewed Today: LHC: 11/09/16  Conclusion     Ost Cx to Prox Cx lesion, 50 %stenosed.   Widely patent stent in the native right coronary.\  90% stenosis in the mid LAD. The LAD is small in caliber and in the 2.0-2.25 mm range.  Widely patent circumflex with ostial 50% circumflex.  The left main is widely patent.  Successful mid LAD stent reducing 90% stenosis to 0% using a 2.25 mm x 15 mm Onyx DES dilated to 14 atm 2. TIMI grade 3 flow was noted.  RECOMMENDATIONS:   Continue Plavix indefinitely.  Discharge this evening if no complications and the patient is able to ambulate without problems.  Risk factor modification     Echo Study Conclusions 08/2016  - Left ventricle: Mild distal septal and inferior wall hypokinesis.   The cavity size was normal. Wall thickness was increased in a   pattern of mild LVH. Systolic function was normal. The estimated   ejection fraction was in the range of 50% to 55%. Wall motion was   normal; there were no regional wall motion abnormalities. - Atrial septum: No defect or patent foramen ovale was identified. - Impressions: GLS -14.9 lower limit  normal 15.  Impressions:  - GLS -14.9 lower limit normal 15.  Assessment/Plan:  1. CAD with recent STEMI with DES to the RCA - residual CAD and now s/p stent to the LAD - back pain resolved. Minor "twinges" of chest pain - seems much improved overall to me. No change with current regimen.   2. DAPT therapy - recheck lab today. No problems noted.   3. Morbid obesity  4. Uncontrolled DM - discussed at length - needs excellent blood sugar control.   5. Chronic skin ulcers - followed by  dermatology  6. Behcet's disease - will send to rheumatology per her request.   Current medicines are reviewed with the patient today.  The patient does not have concerns regarding medicines other than what has been noted above.  The following changes have been made:  See above.  Labs/ tests ordered today include:    Orders Placed This Encounter  Procedures  . Basic metabolic panel  . CBC  . Ambulatory referral to Rheumatology  . EKG 12-Lead     Disposition:   FU with Dr. Tamala Julian in one month.   Patient is agreeable to this plan and will call if any problems develop in the interim.   SignedTruitt Merle, NP  11/21/2016 10:54 AM  [facility] [address] [place], [place]  [address] Phone: [phone] Fax: [phone]

## 2016-11-21 NOTE — Patient Instructions (Addendum)
We will be checking the following labs today - BMET and CBC   Medication Instructions:    Continue with your current medicines.     Testing/Procedures To Be Arranged:  N/A  Follow-Up:   See Dr. Katrinka Blazing in about one month  Referral to rheumatology    Other Special Instructions:   N/A    If you need a refill on your cardiac medications before your next appointment, please call your pharmacy.   Call the [facility] office at [phone] if you have any questions, problems or concerns.

## 2016-11-24 DIAGNOSIS — I25119 Atherosclerotic heart disease of native coronary artery with unspecified angina pectoris: Secondary | ICD-10-CM | POA: Diagnosis not present

## 2016-11-24 DIAGNOSIS — I2111 ST elevation (STEMI) myocardial infarction involving right coronary artery: Secondary | ICD-10-CM | POA: Diagnosis not present

## 2016-11-29 DIAGNOSIS — I25119 Atherosclerotic heart disease of native coronary artery with unspecified angina pectoris: Secondary | ICD-10-CM | POA: Diagnosis not present

## 2016-11-29 DIAGNOSIS — I2111 ST elevation (STEMI) myocardial infarction involving right coronary artery: Secondary | ICD-10-CM | POA: Diagnosis not present

## 2016-12-01 DIAGNOSIS — I25119 Atherosclerotic heart disease of native coronary artery with unspecified angina pectoris: Secondary | ICD-10-CM | POA: Diagnosis not present

## 2016-12-01 DIAGNOSIS — I2111 ST elevation (STEMI) myocardial infarction involving right coronary artery: Secondary | ICD-10-CM | POA: Diagnosis not present

## 2016-12-05 DIAGNOSIS — I2111 ST elevation (STEMI) myocardial infarction involving right coronary artery: Secondary | ICD-10-CM | POA: Diagnosis not present

## 2016-12-05 DIAGNOSIS — I25119 Atherosclerotic heart disease of native coronary artery with unspecified angina pectoris: Secondary | ICD-10-CM | POA: Diagnosis not present

## 2016-12-12 DIAGNOSIS — M47817 Spondylosis without myelopathy or radiculopathy, lumbosacral region: Secondary | ICD-10-CM | POA: Diagnosis not present

## 2016-12-12 DIAGNOSIS — M47812 Spondylosis without myelopathy or radiculopathy, cervical region: Secondary | ICD-10-CM | POA: Diagnosis not present

## 2016-12-12 DIAGNOSIS — M533 Sacrococcygeal disorders, not elsewhere classified: Secondary | ICD-10-CM | POA: Diagnosis not present

## 2016-12-12 DIAGNOSIS — M5416 Radiculopathy, lumbar region: Secondary | ICD-10-CM | POA: Diagnosis not present

## 2016-12-29 ENCOUNTER — Ambulatory Visit: Payer: Medicare Other | Admitting: Interventional Cardiology

## 2017-01-09 ENCOUNTER — Other Ambulatory Visit: Payer: Self-pay | Admitting: Cardiology

## 2017-01-09 DIAGNOSIS — M47812 Spondylosis without myelopathy or radiculopathy, cervical region: Secondary | ICD-10-CM | POA: Diagnosis not present

## 2017-01-09 DIAGNOSIS — M47817 Spondylosis without myelopathy or radiculopathy, lumbosacral region: Secondary | ICD-10-CM | POA: Diagnosis not present

## 2017-01-09 DIAGNOSIS — Z5181 Encounter for therapeutic drug level monitoring: Secondary | ICD-10-CM | POA: Diagnosis not present

## 2017-01-09 DIAGNOSIS — Z79891 Long term (current) use of opiate analgesic: Secondary | ICD-10-CM | POA: Diagnosis not present

## 2017-01-09 DIAGNOSIS — M5136 Other intervertebral disc degeneration, lumbar region: Secondary | ICD-10-CM | POA: Diagnosis not present

## 2017-01-09 DIAGNOSIS — M5416 Radiculopathy, lumbar region: Secondary | ICD-10-CM | POA: Diagnosis not present

## 2017-01-09 DIAGNOSIS — G894 Chronic pain syndrome: Secondary | ICD-10-CM | POA: Diagnosis not present

## 2017-01-09 DIAGNOSIS — M5030 Other cervical disc degeneration, unspecified cervical region: Secondary | ICD-10-CM | POA: Diagnosis not present

## 2017-02-08 DIAGNOSIS — M5136 Other intervertebral disc degeneration, lumbar region: Secondary | ICD-10-CM | POA: Diagnosis not present

## 2017-02-08 DIAGNOSIS — M5030 Other cervical disc degeneration, unspecified cervical region: Secondary | ICD-10-CM | POA: Diagnosis not present

## 2017-02-08 DIAGNOSIS — G894 Chronic pain syndrome: Secondary | ICD-10-CM | POA: Diagnosis not present

## 2017-02-08 DIAGNOSIS — Z5181 Encounter for therapeutic drug level monitoring: Secondary | ICD-10-CM | POA: Diagnosis not present

## 2017-02-10 ENCOUNTER — Ambulatory Visit (INDEPENDENT_AMBULATORY_CARE_PROVIDER_SITE_OTHER): Payer: Medicare Other | Admitting: Interventional Cardiology

## 2017-02-10 ENCOUNTER — Encounter: Payer: Self-pay | Admitting: Interventional Cardiology

## 2017-02-10 ENCOUNTER — Encounter (INDEPENDENT_AMBULATORY_CARE_PROVIDER_SITE_OTHER): Payer: Self-pay

## 2017-02-10 VITALS — BP 112/66 | HR 92 | Ht 69.0 in | Wt 287.4 lb

## 2017-02-10 DIAGNOSIS — I25119 Atherosclerotic heart disease of native coronary artery with unspecified angina pectoris: Secondary | ICD-10-CM

## 2017-02-10 DIAGNOSIS — E669 Obesity, unspecified: Secondary | ICD-10-CM | POA: Diagnosis not present

## 2017-02-10 DIAGNOSIS — E782 Mixed hyperlipidemia: Secondary | ICD-10-CM

## 2017-02-10 DIAGNOSIS — I10 Essential (primary) hypertension: Secondary | ICD-10-CM

## 2017-02-10 DIAGNOSIS — E1169 Type 2 diabetes mellitus with other specified complication: Secondary | ICD-10-CM

## 2017-02-10 DIAGNOSIS — I2510 Atherosclerotic heart disease of native coronary artery without angina pectoris: Secondary | ICD-10-CM

## 2017-02-10 DIAGNOSIS — I209 Angina pectoris, unspecified: Secondary | ICD-10-CM

## 2017-02-10 MED ORDER — ISOSORBIDE MONONITRATE ER 60 MG PO TB24: 60.0000 mg | ORAL_TABLET | Freq: Every day | ORAL | 3 refills | Status: DC

## 2017-02-10 NOTE — Patient Instructions (Signed)
Medication Instructions:  1) START taking Isosorbide 60mg  once daily (You can take two 30mg  tablets at once until you use up your current supply.)  Labwork: None  Testing/Procedures: None  Follow-Up: Your physician recommends that you schedule a follow-up appointment in: 3-6 months with Dr. Katrinka Blazing.    Any Other Special Instructions Will Be Listed Below (If Applicable).     If you need a refill on your cardiac medications before your next appointment, please call your pharmacy.

## 2017-02-10 NOTE — Progress Notes (Signed)
Cardiology Office Note    Date:  02/10/2017   ID:  Brittney Banks, DOB 1970/09/18, MRN [id number]  PCP:  Ronita Hipps, MD  Cardiologist: Sinclair Grooms, MD   Chief Complaint  Patient presents with  . Coronary Artery Disease    angina pectoris    History of Present Illness:  Brittney Banks is a 46 y.o. female CAD with recent STEMI back in April of 2018. She had emergent cath and found to have 100% RCA stenosis and had PCI with ONYX, DES and catheter based thrombectomy prior to stenting. Noted to have residual CAD. Had persisting chest discomfort for months following initial presentation and ultimately underwent PCI and stenting of a small caliber mid to distal LAD in July 2018.  Overall she is better than prior to LAD stenting. She still has some exertional pressure in her chest but markedly improved. She can walk up to a mild on her current medical regimen without much difficulty. Nitroglycerin use has significantly decreased. She is still having trouble with her autoimmune disease/Behcet's disease.  Past Medical History:  Diagnosis Date  . Allergy   . Behcet's disease [facility])    patient advised RN she has Behcet's disease  . Cellulitis 03/2016  . Coronary artery disease    11/09/16 PCI with DES to mLAD, patent RCA stent  . DDD (degenerative disc disease), lumbar   . Diabetes mellitus without complication ([place])    type 2  . Hyperlipidemia   . Hypertension   . LV dysfunction   . MRSA carrier 2008  . MRSA infection 2008   BOTH LEGS  . Myocardial infarction involving right coronary artery [facility])     Past Surgical History:  Procedure Laterality Date  . BILATERAL CARPAL TUNNEL RELEASE  2007  . CORONARY STENT INTERVENTION N/A 08/15/2016   Procedure: Coronary Stent Intervention;  Surgeon: Belva Crome, MD;  Location: [place] [facility];  Service: Cardiovascular;  Laterality: N/A;  . CORONARY STENT INTERVENTION N/A 11/09/2016   Procedure: Coronary Stent Intervention;  Surgeon:  Belva Crome, MD;  Location: [place] [facility];  Service: Cardiovascular;  Laterality: N/A;  . ECTOPIC PREGNANCY SURGERY     2 SURGERIES  . LEFT HEART CATH AND CORONARY ANGIOGRAPHY N/A 08/15/2016   Procedure: Left Heart Cath and Coronary Angiography;  Surgeon: Belva Crome, MD;  Location: [place] [facility];  Service: Cardiovascular;  Laterality: N/A;  . LEFT HEART CATH AND CORONARY ANGIOGRAPHY N/A 11/09/2016   Procedure: Left Heart Cath and Coronary Angiography;  Surgeon: Belva Crome, MD;  Location: [place] [facility];  Service: Cardiovascular;  Laterality: N/A;  . NECK SURGERY N/A 2008  . REPLACEMENT TOTAL KNEE BILATERAL Right   . TONSILLECTOMY    . ULTRASOUND GUIDANCE FOR VASCULAR ACCESS  11/09/2016   Procedure: Ultrasound Guidance For Vascular Access;  Surgeon: Belva Crome, MD;  Location: [place] [facility];  Service: Cardiovascular;;    Current Medications: Outpatient Medications Prior to Visit  Medication Sig Dispense Refill  . carvedilol (COREG) 6.25 MG tablet Take 1 tablet (6.25 mg total) by mouth 2 (two) times daily. 180 tablet 3  . cetirizine (ZYRTEC) 10 MG tablet Take 10 mg by mouth daily.    . clopidogrel (PLAVIX) 75 MG tablet Take 1 tablet (75 mg total) by mouth daily. 35 tablet 5  . fluticasone (FLONASE) 50 MCG/ACT nasal spray Place 1 spray into both nostrils daily as needed for allergies.     Marland Kitchen  glucose monitoring kit (FREESTYLE) monitoring kit 1 each by Does not apply route as needed for other. Dispense any model that is covered- dispense testing supplies for Q AC/ HS accuchecks- 1 month supply with one refil. 1 each 1  . insulin aspart (NOVOLOG FLEXPEN) 100 UNIT/ML FlexPen Inject 3-20 Units into the skin 3 (three) times daily with meals. CBG 70 - 120: 0 units  CBG 121 - 150: 3 units  CBG 151 - 200: 4 units  CBG 201 - 250: 7 units  CBG 251 - 300: 11 units  CBG 301 - 350: 15 units  CBG 351 - 400: 20 units 15 mL 11  . insulin detemir (LEVEMIR) 100 UNIT/ML injection  Inject 0.2 mLs (20 Units total) into the skin at bedtime. 10 mL 11  . methotrexate (RHEUMATREX) 2.5 MG tablet Take 12.5 mg by mouth every Sunday. Caution:Chemotherapy. Protect from light.    . morphine (MS CONTIN) 60 MG 12 hr tablet Take 60 mg by mouth every 8 (eight) hours as needed for pain.    . nitroGLYCERIN (NITROSTAT) 0.4 MG SL tablet Place 1 tablet (0.4 mg total) under the tongue every 5 (five) minutes x 3 doses as needed for chest pain. 25 tablet 3  . Oxycodone HCl 20 MG TABS Take 20 mg by mouth every 4 (four) hours as needed (pain).    Marland Kitchen silver sulfADIAZINE (SILVADENE) 1 % cream apply ONE gram TO SKIN ulcerations ON legs daily  1  . isosorbide mononitrate (IMDUR) 30 MG 24 hr tablet TAKE ONE TABLET BY MOUTH TWICE DAILY 60 tablet 11  . benzonatate (TESSALON) 200 MG capsule Take 1 Capsule 3 times per day for cough  3  . lisinopril (PRINIVIL,ZESTRIL) 20 MG tablet Take 1 tablet (20 mg total) by mouth daily. 90 tablet 3  . metFORMIN (GLUCOPHAGE) 850 MG tablet Take 1 tablet (850 mg total) by mouth 3 (three) times daily with meals. Resume 48 hr after discharge.    . predniSONE (DELTASONE) 5 MG tablet Take 5 mg by mouth daily with breakfast.     . rosuvastatin (CRESTOR) 20 MG tablet Take 1 tablet (20 mg total) by mouth daily. 30 tablet 5   No facility-administered medications prior to visit.      Allergies:   Anaprox [naproxen sodium]; Aspirin; Brilinta [ticagrelor]; Ceftin [cefuroxime axetil]; Celexa [citalopram hydrobromide]; Ciprofloxacin; Coconut fragrance; Contrast media [iodinated diagnostic agents]; Cortisol [hydrocortisone]; Elavil [amitriptyline]; Hctz [hydrochlorothiazide]; Imitrex [sumatriptan]; Lexapro [escitalopram oxalate]; Methadone; Metrizamide; Motrin [ibuprofen]; Naprosyn [naproxen]; Neurontin [gabapentin]; Penicillins; Sulfa antibiotics; Sulfasalazine; Tagamet [cimetidine]; Toradol [ketorolac tromethamine]; Vancomycin; Zoloft [sertraline hcl]; Iodine; and Lipitor [atorvastatin]    Social History   Social History  . Marital status: Single    Spouse name: N/A  . Number of children: N/A  . Years of education: N/A   Social History Main Topics  . Smoking status: Former Smoker    Quit date: 08/17/2010  . Smokeless tobacco: Never Used  . Alcohol use No  . Drug use: No  . Sexual activity: Not Asked   Other Topics Concern  . None   Social History Narrative  . None     Family History:  The patient's She was adopted. Family history is unknown by patient.   ROS:   Please see the history of present illness.    Not smoking. Leg swelling, shortness of breath when lying down, cough, shortness of breath with activity, back pain, muscle pain, easy bruising, joint swelling, headaches, bleeding, leg pain, excessive fatigue, recent fever, and  chest pressure.  All other systems reviewed and are negative.   PHYSICAL EXAM:   VS:  BP 112/66   Pulse 92   Ht _0  (1.753 m)   Wt 287 lb 6.4 oz (130.4 kg)   LMP  (LMP Unknown)   BMI 42.44 kg/m    GEN: Well nourished, well developed, in no acute distress  HEENT: normal  Neck: no JVD, carotid bruits, or masses Cardiac: RRR; no murmurs, rubs, or gallops,no edema  Respiratory:  clear to auscultation bilaterally, normal work of breathing GI: soft, nontender, nondistended, + BS MS: no deformity or atrophy  Skin: warm and dry, no rash Neuro:  Alert and Oriented x 3, Strength and sensation are intact Psych: euthymic mood, full affect  Wt Readings from Last 3 Encounters:  02/10/17 287 lb 6.4 oz (130.4 kg)  11/21/16 (!) 307 lb (139.3 kg)  11/09/16 280 lb (127 kg)      Studies/Labs Reviewed:   EKG:  EKG  No change.  Recent Labs: 08/15/2016: B Natriuretic Peptide 227.0; TSH 0.504 11/07/2016: ALT 42 11/21/2016: BUN 13; Creatinine, Ser 0.73; Hemoglobin 14.8; Platelets 187; Potassium 4.0; Sodium 136   Lipid Panel    Component Value Date/Time   CHOL 125 11/07/2016 1124   TRIG 206 (H) 11/07/2016 1124   HDL 37 (L)  11/07/2016 1124   CHOLHDL 3.4 11/07/2016 1124   CHOLHDL 6.9 08/16/2016 0342   VLDL 24 08/16/2016 0342   LDLCALC 47 11/07/2016 1124    Additional studies/ records that were reviewed today include:  CAD/PCI July 2018: Coronary Diagrams  Diagnostic Diagram      Post-Intervention Diagram          ASSESSMENT:    1. Coronary artery disease involving native coronary artery of native heart with angina pectoris ([place])   2. Essential hypertension   3. Mixed hyperlipidemia   4. Diabetes mellitus type 2 in obese [facility])      PLAN:  In order of problems listed above:  1. Stable for still having angina. Images were reviewed. No focal high-grade obstruction is noted. Consolidate isosorbide to a 60 mg dose each morning. Encouraged aerobic activity. 2. Blood pressure control is adequate. Weight loss and aerobic activity will help improve overall status. 3. Continue Crestor therapy for lipid lowering.LDL target less than 70. 4. Hemoglobin A1c less than 7  Clinical follow-up in 6 months. Increase Imdur to 60 mg once daily. Nitroglycerin use as needed. Increase aerobic activity.    Medication Adjustments/Labs and Tests Ordered: Current medicines are reviewed at length with the patient today.  Concerns regarding medicines are outlined above.  Medication changes, Labs and Tests ordered today are listed in the Patient Instructions below. Patient Instructions  Medication Instructions:  1) START taking Isosorbide 103m once daily (You can take two 361mtablets at once until you use up your current supply.)  Labwork: None  Testing/Procedures: None  Follow-Up: Your physician recommends that you schedule a follow-up appointment in: 3-6 months with Dr. SmTamala Julian   Any Other Special Instructions Will Be Listed Below (If Applicable).     If you need a refill on your cardiac medications before your next appointment, please call your pharmacy.      Signed, HeSinclair GroomsMD   02/10/2017 4:25 PM    [facility] [address]  [id number]: [phone]: [phone]

## 2017-02-13 DIAGNOSIS — M5136 Other intervertebral disc degeneration, lumbar region: Secondary | ICD-10-CM | POA: Diagnosis not present

## 2017-02-13 DIAGNOSIS — M352 Behcet's disease: Secondary | ICD-10-CM | POA: Diagnosis not present

## 2017-02-13 DIAGNOSIS — L88 Pyoderma gangrenosum: Secondary | ICD-10-CM | POA: Diagnosis not present

## 2017-02-13 DIAGNOSIS — Z6841 Body Mass Index (BMI) 40.0 and over, adult: Secondary | ICD-10-CM | POA: Diagnosis not present

## 2017-02-15 ENCOUNTER — Other Ambulatory Visit: Payer: Self-pay | Admitting: Cardiology

## 2017-03-07 DIAGNOSIS — Z5181 Encounter for therapeutic drug level monitoring: Secondary | ICD-10-CM | POA: Diagnosis not present

## 2017-03-07 DIAGNOSIS — G894 Chronic pain syndrome: Secondary | ICD-10-CM | POA: Diagnosis not present

## 2017-03-07 DIAGNOSIS — M5030 Other cervical disc degeneration, unspecified cervical region: Secondary | ICD-10-CM | POA: Diagnosis not present

## 2017-03-07 DIAGNOSIS — M5136 Other intervertebral disc degeneration, lumbar region: Secondary | ICD-10-CM | POA: Diagnosis not present

## 2017-03-16 ENCOUNTER — Telehealth: Payer: Self-pay | Admitting: Interventional Cardiology

## 2017-03-16 NOTE — Telephone Encounter (Signed)
Agree 

## 2017-03-16 NOTE — Telephone Encounter (Signed)
Patient states that since Sunday she has been having intermittent chest pain. She c/o chest pain while sitting watching TV, she describes the chest pain radiating to her back between her shoulder blades and up in neck. Patient denies sob, dizziness/lightheaded or nausea. She describes this pain the same before her second stent was placed. She said the pain was relieved with 1 nitro. On Sunday she took a total of 3 within the course of the day. Today she states that she took 1 nitro at 7AM and another at 10AM. Patient was seen by Dr. Katrinka Blazing on 02/10/17 where she was started on Isosorbide 60 mg once daily which she states she is taking as prescribed.   I informed patient that she should go to the ED to be evaluated since she is having frequent episodes of CP and it feels the same as her prior chest pain before. Patient verbalized understanding and thanked me for the call.

## 2017-03-16 NOTE — Telephone Encounter (Signed)
New Message     Pt c/o of Chest Pain: STAT if CP now or developed within 24 hours  1. Are you having CP right now? no  2. Are you experiencing any other symptoms (ex. SOB, nausea, vomiting, sweating)? Sob and the chest pain with exertion  3. How long have you been experiencing CP? This morning   4. Is your CP continuous or coming and going? Comes and goes  5. Have you taken Nitroglycerin? Yes took 2 this morning  ?

## 2017-03-17 NOTE — H&P (View-Only) (Signed)
Cardiology Office Note   Date:  03/20/2017   ID:  Brittney Banks, DOB Sep 15, 1970, MRN [id number]  PCP:  Ronita Hipps, MD  Cardiologist:  Dr. Tamala Julian    Chief Complaint  Patient presents with  . Chest Pain    CAD      History of Present Illness: Brittney Banks is a 46 y.o. female who presents for recurrent chest pain requiring NTG.    She has a hx of CAD with recent STEMI back in April of 2018. She had emergent cath and found to have 100% RCA stenosis and had PCI with ONYX, DES and catheter based thrombectomy prior to stenting. Noted tohave residual CAD. Had persisting chest discomfort for months following initial presentation and ultimately underwent PCI and stenting of a small caliber mid to distal LAD in July 2018.  She is still having trouble with her autoimmune disease/Behcet's disease.  On last visit she had return of angina and imdur 60 mg increased from 30 mg.  Today the angina occurs at rest twice a day and she has to take NTG.    No associated symptoms of nausea. diaphoresis or SOB though she does had DOE with exertion. She will take NTG with improvement.   Her diabetes is better controlled off the steroids.  She has 2 episodes of chest pain daily.  This has increased from last visit and her BP is soft.    Past Medical History:  Diagnosis Date  . Allergy   . Behcet's disease [facility])    patient advised RN she has Behcet's disease  . Cellulitis 03/2016  . Coronary artery disease    11/09/16 PCI with DES to mLAD, patent RCA stent  . DDD (degenerative disc disease), lumbar   . Diabetes mellitus without complication ([place])    type 2  . Hyperlipidemia   . Hypertension   . LV dysfunction   . MRSA carrier 2008  . MRSA infection 2008   BOTH LEGS  . Myocardial infarction involving right coronary artery [facility])     Past Surgical History:  Procedure Laterality Date  . BILATERAL CARPAL TUNNEL RELEASE  2007  . CORONARY STENT INTERVENTION N/A 08/15/2016   Procedure: Coronary  Stent Intervention;  Surgeon: Belva Crome, MD;  Location: [place] [facility];  Service: Cardiovascular;  Laterality: N/A;  . CORONARY STENT INTERVENTION N/A 11/09/2016   Procedure: Coronary Stent Intervention;  Surgeon: Belva Crome, MD;  Location: [place] [facility];  Service: Cardiovascular;  Laterality: N/A;  . ECTOPIC PREGNANCY SURGERY     2 SURGERIES  . LEFT HEART CATH AND CORONARY ANGIOGRAPHY N/A 08/15/2016   Procedure: Left Heart Cath and Coronary Angiography;  Surgeon: Belva Crome, MD;  Location: [place] [facility];  Service: Cardiovascular;  Laterality: N/A;  . LEFT HEART CATH AND CORONARY ANGIOGRAPHY N/A 11/09/2016   Procedure: Left Heart Cath and Coronary Angiography;  Surgeon: Belva Crome, MD;  Location: [place] [facility];  Service: Cardiovascular;  Laterality: N/A;  . NECK SURGERY N/A 2008  . REPLACEMENT TOTAL KNEE BILATERAL Right   . TONSILLECTOMY    . ULTRASOUND GUIDANCE FOR VASCULAR ACCESS  11/09/2016   Procedure: Ultrasound Guidance For Vascular Access;  Surgeon: Belva Crome, MD;  Location: [place] [facility];  Service: Cardiovascular;;     Current Outpatient Medications  Medication Sig Dispense Refill  . carvedilol (COREG) 6.25 MG tablet Take 1 tablet (6.25 mg total) by mouth 2 (two) times daily. 180 tablet  3  . cetirizine (ZYRTEC) 10 MG tablet Take 10 mg by mouth daily.    . clopidogrel (PLAVIX) 75 MG tablet Take 1 tablet (75 mg total) by mouth daily. 35 tablet 5  . fluticasone (FLONASE) 50 MCG/ACT nasal spray Place 1 spray into both nostrils daily as needed for allergies.     Marland Kitchen glucose monitoring kit (FREESTYLE) monitoring kit 1 each by Does not apply route as needed for other. Dispense any model that is covered- dispense testing supplies for Q AC/ HS accuchecks- 1 month supply with one refil. 1 each 1  . insulin aspart (NOVOLOG FLEXPEN) 100 UNIT/ML FlexPen Inject 3-20 Units into the skin 3 (three) times daily with meals. CBG 70 - 120: 0 units  CBG 121 - 150: 3  units  CBG 151 - 200: 4 units  CBG 201 - 250: 7 units  CBG 251 - 300: 11 units  CBG 301 - 350: 15 units  CBG 351 - 400: 20 units 15 mL 11  . insulin detemir (LEVEMIR) 100 UNIT/ML injection Inject 0.2 mLs (20 Units total) into the skin at bedtime. 10 mL 11  . isosorbide mononitrate (IMDUR) 60 MG 24 hr tablet Take 1 tablet (60 mg total) by mouth daily. 90 tablet 3  . lisinopril (PRINIVIL,ZESTRIL) 20 MG tablet Take 20 mg by mouth daily.    . metFORMIN (GLUCOPHAGE) 850 MG tablet Take 850 mg by mouth 3 (three) times daily with meals.    Marland Kitchen morphine (MS CONTIN) 60 MG 12 hr tablet Take 60 mg by mouth every 8 (eight) hours as needed for pain.    . nitroGLYCERIN (NITROSTAT) 0.4 MG SL tablet Place 1 tablet (0.4 mg total) under the tongue every 5 (five) minutes x 3 doses as needed for chest pain. 25 tablet 3  . Oxycodone HCl 20 MG TABS Take 20 mg by mouth every 4 (four) hours as needed (pain).    . rosuvastatin (CRESTOR) 20 MG tablet Take 1 tablet (20 mg total) by mouth daily. 30 tablet 11  . silver sulfADIAZINE (SILVADENE) 1 % cream apply ONE gram TO SKIN ulcerations ON legs daily  1   No current facility-administered medications for this visit.     Allergies:   Anaprox [naproxen sodium]; Aspirin; Brilinta [ticagrelor]; Ceftin [cefuroxime axetil]; Celexa [citalopram hydrobromide]; Ciprofloxacin; Coconut fragrance; Contrast media [iodinated diagnostic agents]; Cortisol [hydrocortisone]; Elavil [amitriptyline]; Hctz [hydrochlorothiazide]; Imitrex [sumatriptan]; Lexapro [escitalopram oxalate]; Methadone; Metrizamide; Motrin [ibuprofen]; Naprosyn [naproxen]; Neurontin [gabapentin]; Penicillins; Sulfa antibiotics; Sulfasalazine; Tagamet [cimetidine]; Toradol [ketorolac tromethamine]; Vancomycin; Zoloft [sertraline hcl]; Iodine; and Lipitor [atorvastatin]    Social History:  The patient  reports that she quit smoking about 6 years ago. she has never used smokeless tobacco. She reports that she does not drink  alcohol or use drugs.   Family History:  The patient's She was adopted. Family history is unknown by patient.    ROS:  General:no colds or fevers, no weight changes though + wt loss from August of 20 lbs.   Skin:no rashes + ulcers on legs chronic  HEENT:no blurred vision, no congestion CV:see HPI PUL:see HPI GI:no diarrhea constipation or melena, no indigestion GU:no hematuria, no dysuria MS:no joint pain, no claudication Neuro:no syncope, no lightheadedness Endo:+ diabetes, no thyroid disease  Wt Readings from Last 3 Encounters:  03/20/17 287 lb 12.8 oz (130.5 kg)  02/10/17 287 lb 6.4 oz (130.4 kg)  11/21/16 (!) 307 lb (139.3 kg)     PHYSICAL EXAM: VS:  BP 106/76   Pulse 93  Resp 16   Ht _0  (1.753 m)   Wt 287 lb 12.8 oz (130.5 kg)   LMP  (LMP Unknown)   SpO2 98%   BMI 42.50 kg/m  , BMI Body mass index is 42.5 kg/m. General:Pleasant affect, NAD Skin:Warm and dry, brisk capillary refill HEENT:normocephalic, sclera clear, mucus membranes moist Neck:supple, no JVD, no bruits  Heart:S1S2 RRR without murmur, gallup, rub or click Lungs:clear without rales, rhonchi, or wheezes ESP:QZRA, non tender, + BS, do not palpate liver spleen or masses Ext:+ 1 lower ext edema, + skin ulcers of legs, no infection noted, 2+ pedal pulses, 2+ radial pulses Neuro:alert and oriented X 3, MAE, follows commands, + facial symmetry    EKG:  EKG is ordered today. The ekg ordered today demonstrates SR with no acute changes, Q waves in II,III, AVF.    Recent Labs: 08/15/2016: B Natriuretic Peptide 227.0; TSH 0.504 11/07/2016: ALT 42 11/21/2016: BUN 13; Creatinine, Ser 0.73; Hemoglobin 14.8; Platelets 187; Potassium 4.0; Sodium 136    Lipid Panel    Component Value Date/Time   CHOL 125 11/07/2016 1124   TRIG 206 (H) 11/07/2016 1124   HDL 37 (L) 11/07/2016 1124   CHOLHDL 3.4 11/07/2016 1124   CHOLHDL 6.9 08/16/2016 0342   VLDL 24 08/16/2016 0342   LDLCALC 47 11/07/2016 1124        Other studies Reviewed: Additional studies/ records that were reviewed today include: .  Widely patent stent in the native right coronary.\  90% stenosis in the mid LAD. The LAD is small in caliber and in the 2.0-2.25 mm range.  Widely patent circumflex with ostial 50% circumflex.  The left main is widely patent.  Successful mid LAD stent reducing 90% stenosis to 0% using a 2.25 mm x 15 mm Onyx DES dilated to 14 atm 2. TIMI grade 3 flow was noted.  RECOMMENDATIONS:   Continue Plavix indefinitely.  Discharge this evening if no complications and the patient is able to ambulate without problems.  Risk factor modification   ASSESSMENT AND PLAN:  1.  Unstable/ crescendo angina relief with SL NTG and improvement on DOE with increase of Imdur.  Her BP is soft.  Discussed with Dr. Tamala Julian and she may have re stenosed the stent in LAD small vessel vs stenosis prox or distal to stent.  Will plan for cath to further eval.   She is allergic to ASA so on plavix alone.   With Brilinta she became SOB.  Pt with multiple allergies including contrast.    - pre-medicate  With prednisone 50 mg every 6 hours prior to cath. X 3 and will give benadryl IV prior to procedure.    The patient understands that risks included but are not limited to stroke (1 in 1000), death (1 in 77), kidney failure [usually temporary] (1 in 500), bleeding (1 in 200), allergic reaction [possibly serious] (1 in 200).   She will follow up post cath.   2.  CAD with hx MI with stent to RCA,  And stent to LAD.  3.  Pyoderma gangrenosa no longer on steroids or methtrexate continues with skin ulcers  4.  DM2 per PCP and improved control  5.  Multiple allergies.   6.  HLD on crestor    Current medicines are reviewed with the patient today.  The patient Has no concerns regarding medicines.  The following changes have been made:  See above Labs/ tests ordered today include:see above  Disposition:   FU:  see  above  Signed, Cecilie Kicks, NP  03/20/2017 12:42 PM    [facility] [place] [place], [place] Phone: [phone]; Fax: [phone]

## 2017-03-17 NOTE — Progress Notes (Signed)
Cardiology Office Note   Date:  03/20/2017   ID:  Brittney Banks, DOB Sep 15, 1970, MRN [id number]  PCP:  Ronita Hipps, MD  Cardiologist:  Dr. Tamala Julian    Chief Complaint  Patient presents with  . Chest Pain    CAD      History of Present Illness: Brittney Banks is a 46 y.o. female who presents for recurrent chest pain requiring NTG.    She has a hx of CAD with recent STEMI back in April of 2018. She had emergent cath and found to have 100% RCA stenosis and had PCI with ONYX, DES and catheter based thrombectomy prior to stenting. Noted tohave residual CAD. Had persisting chest discomfort for months following initial presentation and ultimately underwent PCI and stenting of a small caliber mid to distal LAD in July 2018.  She is still having trouble with her autoimmune disease/Behcet's disease.  On last visit she had return of angina and imdur 60 mg increased from 30 mg.  Today the angina occurs at rest twice a day and she has to take NTG.    No associated symptoms of nausea. diaphoresis or SOB though she does had DOE with exertion. She will take NTG with improvement.   Her diabetes is better controlled off the steroids.  She has 2 episodes of chest pain daily.  This has increased from last visit and her BP is soft.    Past Medical History:  Diagnosis Date  . Allergy   . Behcet's disease [facility])    patient advised RN she has Behcet's disease  . Cellulitis 03/2016  . Coronary artery disease    11/09/16 PCI with DES to mLAD, patent RCA stent  . DDD (degenerative disc disease), lumbar   . Diabetes mellitus without complication ([place])    type 2  . Hyperlipidemia   . Hypertension   . LV dysfunction   . MRSA carrier 2008  . MRSA infection 2008   BOTH LEGS  . Myocardial infarction involving right coronary artery [facility])     Past Surgical History:  Procedure Laterality Date  . BILATERAL CARPAL TUNNEL RELEASE  2007  . CORONARY STENT INTERVENTION N/A 08/15/2016   Procedure: Coronary  Stent Intervention;  Surgeon: Belva Crome, MD;  Location: [place] [facility];  Service: Cardiovascular;  Laterality: N/A;  . CORONARY STENT INTERVENTION N/A 11/09/2016   Procedure: Coronary Stent Intervention;  Surgeon: Belva Crome, MD;  Location: [place] [facility];  Service: Cardiovascular;  Laterality: N/A;  . ECTOPIC PREGNANCY SURGERY     2 SURGERIES  . LEFT HEART CATH AND CORONARY ANGIOGRAPHY N/A 08/15/2016   Procedure: Left Heart Cath and Coronary Angiography;  Surgeon: Belva Crome, MD;  Location: [place] [facility];  Service: Cardiovascular;  Laterality: N/A;  . LEFT HEART CATH AND CORONARY ANGIOGRAPHY N/A 11/09/2016   Procedure: Left Heart Cath and Coronary Angiography;  Surgeon: Belva Crome, MD;  Location: [place] [facility];  Service: Cardiovascular;  Laterality: N/A;  . NECK SURGERY N/A 2008  . REPLACEMENT TOTAL KNEE BILATERAL Right   . TONSILLECTOMY    . ULTRASOUND GUIDANCE FOR VASCULAR ACCESS  11/09/2016   Procedure: Ultrasound Guidance For Vascular Access;  Surgeon: Belva Crome, MD;  Location: [place] [facility];  Service: Cardiovascular;;     Current Outpatient Medications  Medication Sig Dispense Refill  . carvedilol (COREG) 6.25 MG tablet Take 1 tablet (6.25 mg total) by mouth 2 (two) times daily. 180 tablet  3  . cetirizine (ZYRTEC) 10 MG tablet Take 10 mg by mouth daily.    . clopidogrel (PLAVIX) 75 MG tablet Take 1 tablet (75 mg total) by mouth daily. 35 tablet 5  . fluticasone (FLONASE) 50 MCG/ACT nasal spray Place 1 spray into both nostrils daily as needed for allergies.     Marland Kitchen glucose monitoring kit (FREESTYLE) monitoring kit 1 each by Does not apply route as needed for other. Dispense any model that is covered- dispense testing supplies for Q AC/ HS accuchecks- 1 month supply with one refil. 1 each 1  . insulin aspart (NOVOLOG FLEXPEN) 100 UNIT/ML FlexPen Inject 3-20 Units into the skin 3 (three) times daily with meals. CBG 70 - 120: 0 units  CBG 121 - 150: 3  units  CBG 151 - 200: 4 units  CBG 201 - 250: 7 units  CBG 251 - 300: 11 units  CBG 301 - 350: 15 units  CBG 351 - 400: 20 units 15 mL 11  . insulin detemir (LEVEMIR) 100 UNIT/ML injection Inject 0.2 mLs (20 Units total) into the skin at bedtime. 10 mL 11  . isosorbide mononitrate (IMDUR) 60 MG 24 hr tablet Take 1 tablet (60 mg total) by mouth daily. 90 tablet 3  . lisinopril (PRINIVIL,ZESTRIL) 20 MG tablet Take 20 mg by mouth daily.    . metFORMIN (GLUCOPHAGE) 850 MG tablet Take 850 mg by mouth 3 (three) times daily with meals.    Marland Kitchen morphine (MS CONTIN) 60 MG 12 hr tablet Take 60 mg by mouth every 8 (eight) hours as needed for pain.    . nitroGLYCERIN (NITROSTAT) 0.4 MG SL tablet Place 1 tablet (0.4 mg total) under the tongue every 5 (five) minutes x 3 doses as needed for chest pain. 25 tablet 3  . Oxycodone HCl 20 MG TABS Take 20 mg by mouth every 4 (four) hours as needed (pain).    . rosuvastatin (CRESTOR) 20 MG tablet Take 1 tablet (20 mg total) by mouth daily. 30 tablet 11  . silver sulfADIAZINE (SILVADENE) 1 % cream apply ONE gram TO SKIN ulcerations ON legs daily  1   No current facility-administered medications for this visit.     Allergies:   Anaprox [naproxen sodium]; Aspirin; Brilinta [ticagrelor]; Ceftin [cefuroxime axetil]; Celexa [citalopram hydrobromide]; Ciprofloxacin; Coconut fragrance; Contrast media [iodinated diagnostic agents]; Cortisol [hydrocortisone]; Elavil [amitriptyline]; Hctz [hydrochlorothiazide]; Imitrex [sumatriptan]; Lexapro [escitalopram oxalate]; Methadone; Metrizamide; Motrin [ibuprofen]; Naprosyn [naproxen]; Neurontin [gabapentin]; Penicillins; Sulfa antibiotics; Sulfasalazine; Tagamet [cimetidine]; Toradol [ketorolac tromethamine]; Vancomycin; Zoloft [sertraline hcl]; Iodine; and Lipitor [atorvastatin]    Social History:  The patient  reports that she quit smoking about 6 years ago. she has never used smokeless tobacco. She reports that she does not drink  alcohol or use drugs.   Family History:  The patient's She was adopted. Family history is unknown by patient.    ROS:  General:no colds or fevers, no weight changes though + wt loss from August of 20 lbs.   Skin:no rashes + ulcers on legs chronic  HEENT:no blurred vision, no congestion CV:see HPI PUL:see HPI GI:no diarrhea constipation or melena, no indigestion GU:no hematuria, no dysuria MS:no joint pain, no claudication Neuro:no syncope, no lightheadedness Endo:+ diabetes, no thyroid disease  Wt Readings from Last 3 Encounters:  03/20/17 287 lb 12.8 oz (130.5 kg)  02/10/17 287 lb 6.4 oz (130.4 kg)  11/21/16 (!) 307 lb (139.3 kg)     PHYSICAL EXAM: VS:  BP 106/76   Pulse 93  Resp 16   Ht 5' 9" (1.753 m)   Wt 287 lb 12.8 oz (130.5 kg)   LMP  (LMP Unknown)   SpO2 98%   BMI 42.50 kg/m  , BMI Body mass index is 42.5 kg/m. General:Pleasant affect, NAD Skin:Warm and dry, brisk capillary refill HEENT:normocephalic, sclera clear, mucus membranes moist Neck:supple, no JVD, no bruits  Heart:S1S2 RRR without murmur, gallup, rub or click Lungs:clear without rales, rhonchi, or wheezes Abd:soft, non tender, + BS, do not palpate liver spleen or masses Ext:+ 1 lower ext edema, + skin ulcers of legs, no infection noted, 2+ pedal pulses, 2+ radial pulses Neuro:alert and oriented X 3, MAE, follows commands, + facial symmetry    EKG:  EKG is ordered today. The ekg ordered today demonstrates SR with no acute changes, Q waves in II,III, AVF.    Recent Labs: 08/15/2016: B Natriuretic Peptide 227.0; TSH 0.504 11/07/2016: ALT 42 11/21/2016: BUN 13; Creatinine, Ser 0.73; Hemoglobin 14.8; Platelets 187; Potassium 4.0; Sodium 136    Lipid Panel    Component Value Date/Time   CHOL 125 11/07/2016 1124   TRIG 206 (H) 11/07/2016 1124   HDL 37 (L) 11/07/2016 1124   CHOLHDL 3.4 11/07/2016 1124   CHOLHDL 6.9 08/16/2016 0342   VLDL 24 08/16/2016 0342   LDLCALC 47 11/07/2016 1124        Other studies Reviewed: Additional studies/ records that were reviewed today include: .  Widely patent stent in the native right coronary.\  90% stenosis in the mid LAD. The LAD is small in caliber and in the 2.0-2.25 mm range.  Widely patent circumflex with ostial 50% circumflex.  The left main is widely patent.  Successful mid LAD stent reducing 90% stenosis to 0% using a 2.25 mm x 15 mm Onyx DES dilated to 14 atm 2. TIMI grade 3 flow was noted.  RECOMMENDATIONS:   Continue Plavix indefinitely.  Discharge this evening if no complications and the patient is able to ambulate without problems.  Risk factor modification   ASSESSMENT AND PLAN:  1.  Unstable/ crescendo angina relief with SL NTG and improvement on DOE with increase of Imdur.  Her BP is soft.  Discussed with Dr. Smith and she may have re stenosed the stent in LAD small vessel vs stenosis prox or distal to stent.  Will plan for cath to further eval.   She is allergic to ASA so on plavix alone.   With Brilinta she became SOB.  Pt with multiple allergies including contrast.    - pre-medicate  With prednisone 50 mg every 6 hours prior to cath. X 3 and will give benadryl IV prior to procedure.    The patient understands that risks included but are not limited to stroke (1 in 1000), death (1 in 1000), kidney failure [usually temporary] (1 in 500), bleeding (1 in 200), allergic reaction [possibly serious] (1 in 200).   She will follow up post cath.   2.  CAD with hx MI with stent to RCA,  And stent to LAD.  3.  Pyoderma gangrenosa no longer on steroids or methtrexate continues with skin ulcers  4.  DM2 per PCP and improved control  5.  Multiple allergies.   6.  HLD on crestor    Current medicines are reviewed with the patient today.  The patient Has no concerns regarding medicines.  The following changes have been made:  See above Labs/ tests ordered today include:see above  Disposition:   FU:  see    above  Signed, Cecilie Kicks, NP  03/20/2017 12:42 PM    [facility] [place], [place], [place] Phone: [phone]; Fax: [phone]

## 2017-03-20 ENCOUNTER — Encounter: Payer: Self-pay | Admitting: *Deleted

## 2017-03-20 ENCOUNTER — Encounter: Payer: Self-pay | Admitting: Cardiology

## 2017-03-20 ENCOUNTER — Ambulatory Visit (INDEPENDENT_AMBULATORY_CARE_PROVIDER_SITE_OTHER): Payer: Medicare Other | Admitting: Cardiology

## 2017-03-20 VITALS — BP 106/76 | HR 93 | Resp 16 | Ht 69.0 in | Wt 287.8 lb

## 2017-03-20 DIAGNOSIS — E1169 Type 2 diabetes mellitus with other specified complication: Secondary | ICD-10-CM

## 2017-03-20 DIAGNOSIS — Z955 Presence of coronary angioplasty implant and graft: Secondary | ICD-10-CM | POA: Diagnosis not present

## 2017-03-20 DIAGNOSIS — R079 Chest pain, unspecified: Secondary | ICD-10-CM | POA: Diagnosis not present

## 2017-03-20 DIAGNOSIS — I25119 Atherosclerotic heart disease of native coronary artery with unspecified angina pectoris: Secondary | ICD-10-CM | POA: Diagnosis not present

## 2017-03-20 DIAGNOSIS — E669 Obesity, unspecified: Secondary | ICD-10-CM | POA: Diagnosis not present

## 2017-03-20 DIAGNOSIS — I252 Old myocardial infarction: Secondary | ICD-10-CM | POA: Diagnosis not present

## 2017-03-20 DIAGNOSIS — L88 Pyoderma gangrenosum: Secondary | ICD-10-CM | POA: Diagnosis not present

## 2017-03-20 DIAGNOSIS — I2510 Atherosclerotic heart disease of native coronary artery without angina pectoris: Secondary | ICD-10-CM

## 2017-03-20 MED ORDER — PREDNISONE 50 MG PO TABS: 50.0000 mg | ORAL_TABLET | Freq: Four times a day (QID) | ORAL | 0 refills | Status: DC

## 2017-03-20 NOTE — Patient Instructions (Addendum)
Medication Instructions:   TAKE PREDNISONE 50 MG  ONE TABLET  EVERY 6 HRS BEFORE PROCEDURE   WEDNESDAY DAY  BEFORE PROCEDURE :  FIRST DOSE  AT 1 PM    SECOND DOSE  AT  7 PM    MORNING OF PROCEDURE :  3RD DOSE AT 7 AM   If you need a refill on your cardiac medications before your next appointment, please call your pharmacy.  Labwork: CBC BMET PT INR    Testing/Procedures:    SEE LETTER FOR LEFT HEART CATH ON 12 -13 -18   Follow-Up:  2 WEEKS POST CATH WITH LORI GERHARDT OR APP  FIRST AVIALABLE   Any Other Special Instructions Will Be Listed Below (If Applicable).

## 2017-03-21 LAB — BASIC METABOLIC PANEL
BUN / CREAT RATIO: 15 (ref 9–23)
BUN: 9 mg/dL (ref 6–24)
CO2: 28 mmol/L (ref 20–29)
CREATININE: 0.62 mg/dL (ref 0.57–1.00)
Calcium: 9.9 mg/dL (ref 8.7–10.2)
Chloride: 98 mmol/L (ref 96–106)
GFR calc non Af Amer: 109 mL/min/{1.73_m2} (ref >59)
GFR, EST AFRICAN AMERICAN: 126 mL/min/{1.73_m2} (ref >59)
Glucose: 197 mg/dL — ABNORMAL HIGH (ref 65–99)
Potassium: 4.9 mmol/L (ref 3.5–5.2)
Sodium: 138 mmol/L (ref 134–144)

## 2017-03-21 LAB — CBC
HEMOGLOBIN: 16.2 g/dL — AB (ref 11.1–15.9)
Hematocrit: 47.3 % — ABNORMAL HIGH (ref 34.0–46.6)
MCH: 32.1 pg (ref 26.6–33.0)
MCHC: 34.2 g/dL (ref 31.5–35.7)
MCV: 94 fL (ref 79–97)
Platelets: 232 10*3/uL (ref 150–379)
RBC: 5.04 x10E6/uL (ref 3.77–5.28)
RDW: 13.8 % (ref 12.3–15.4)
WBC: 8.8 10*3/uL (ref 3.4–10.8)

## 2017-03-21 LAB — PROTIME-INR
INR: 1 (ref 0.8–1.2)
Prothrombin Time: 10.8 s (ref 9.1–12.0)

## 2017-03-22 DIAGNOSIS — Z5181 Encounter for therapeutic drug level monitoring: Secondary | ICD-10-CM | POA: Diagnosis not present

## 2017-03-22 DIAGNOSIS — M5136 Other intervertebral disc degeneration, lumbar region: Secondary | ICD-10-CM | POA: Diagnosis not present

## 2017-03-22 DIAGNOSIS — M5030 Other cervical disc degeneration, unspecified cervical region: Secondary | ICD-10-CM | POA: Diagnosis not present

## 2017-03-22 DIAGNOSIS — G894 Chronic pain syndrome: Secondary | ICD-10-CM | POA: Diagnosis not present

## 2017-03-29 ENCOUNTER — Telehealth: Payer: Self-pay | Admitting: *Deleted

## 2017-03-29 ENCOUNTER — Telehealth: Payer: Self-pay

## 2017-03-29 NOTE — Telephone Encounter (Signed)
SPOKE TO PT WHO WILL BE CONTACTED BACK IN THE AM BEFORE 12 NOON FOR SCHEDULED TIMES AND  INSTRUCTIONS FOR LHC   PT SAYS SHE CAN COME IN FRI 12-14 OR 12-17 WHICH WILL BE IN A TIMEFRAME OF 14 DAYS OF LABS OBTAINED

## 2017-03-29 NOTE — Telephone Encounter (Signed)
Outreach made to Pt.  Pt needs to cancel cath for tomorrow 03/30/2017 d/t snow.  Cancelled with cath lab.

## 2017-03-30 ENCOUNTER — Ambulatory Visit (HOSPITAL_COMMUNITY)
Admission: RE | Admit: 2017-03-30 | Payer: Medicare Other | Source: Ambulatory Visit | Admitting: Interventional Cardiology

## 2017-03-30 ENCOUNTER — Encounter (HOSPITAL_COMMUNITY): Admission: RE | Payer: Self-pay | Source: Ambulatory Visit

## 2017-03-30 ENCOUNTER — Telehealth: Payer: Self-pay | Admitting: *Deleted

## 2017-03-30 SURGERY — LEFT HEART CATH AND CORONARY ANGIOGRAPHY
Anesthesia: LOCAL

## 2017-03-30 NOTE — Telephone Encounter (Signed)
CALL PT  TO  EXPLAIN  NEW TIME AND INSTRUCTIONS. PT CANCELED FIRST LHC 03-30-17  DUE TO WEATHER .   NEW SCHEDULED LHC ON 03-31-17.AT 1130 WITH DR [place]   PT STATED SHE  WOKE UP THIS EARLY WITH SYMPTOMS OF A FEVER OF 101   FEELING FATIGUE AND WEAK.  PT TOOK TYLENOL AND RETOOK TEMP AND STILL 101.  PT CHEST PAIN SYMPTOMS ARE MILD AND ON AND OFF BUT FEELS NITROGLYCERIN  AND IMDUR NOT HELPING AS MUCH.  PT INSTRUCTED TO GO TO EMERGENCY DEPT FOR FURTHER ASISSTANCE IF NEEDED.  PT WAS RECOMMENDED TO CONTACT PRIMARY CARE DOCTER WITH SYMPTOMS AND SEE WHAT HE MAY RECOMMENDED.  PT WAS TOLD TO CONTACT OFFICE BACK [phone] AND GIVE [place] AN UPDATE.   INGOLD PROVIDER WHO RECCOMMENDED LHC WILL BE CONTACTED   ALONG WITH PT AWARE NEW LABS WILL HAVE TO BE REDRAWN DUE TO TIME FRAME OF LAST LABS.

## 2017-03-31 ENCOUNTER — Ambulatory Visit (HOSPITAL_COMMUNITY): Admission: RE | Admit: 2017-03-31 | Payer: Medicare Other | Source: Ambulatory Visit | Admitting: Cardiology

## 2017-03-31 ENCOUNTER — Encounter (HOSPITAL_COMMUNITY): Admission: RE | Payer: Self-pay | Source: Ambulatory Visit

## 2017-03-31 SURGERY — LEFT HEART CATH AND CORONARY ANGIOGRAPHY
Anesthesia: LOCAL

## 2017-03-31 NOTE — Telephone Encounter (Signed)
Have pt call when no fever for 72 hours to reschedule.

## 2017-04-13 ENCOUNTER — Telehealth: Payer: Self-pay | Admitting: Interventional Cardiology

## 2017-04-13 NOTE — Telephone Encounter (Signed)
New Message    Patient calling to reschedule heart cath. Patient had to cancel due to snow. Please call.

## 2017-04-13 NOTE — Telephone Encounter (Signed)
Spoke with pt and she wanted to reschedule her cath.  Gave her available dates.  Pt would like to have on 1/2.  Scheduled pt 1/2 with Dr. Excell Seltzer.  Called pt and went over instructions.  Pt still had prednisone so no need to send in new prescription.  Pt verbalized understanding and was appreciative for call.

## 2017-04-14 ENCOUNTER — Ambulatory Visit: Payer: Medicare Other | Admitting: Nurse Practitioner

## 2017-04-15 ENCOUNTER — Other Ambulatory Visit: Payer: Self-pay | Admitting: Cardiology

## 2017-04-17 ENCOUNTER — Telehealth: Payer: Self-pay

## 2017-04-17 NOTE — Telephone Encounter (Signed)
Left detailed message per DPR:   Confirmed AM meds to be taken pre-cath with sip of water: Prednisone- 8 pm, 2 am, and 8 am Plavix @ 8 am Metformin -hold 1/1 until 48 hours after 1/2 amount levemir night prior No insulin day of  Left this nurse name and # for call back if any questions.

## 2017-04-19 ENCOUNTER — Encounter (HOSPITAL_COMMUNITY)
Admission: RE | Disposition: A | Discharge status: Home / Self Care | Payer: Self-pay | Source: Ambulatory Visit | Attending: Cardiovascular Disease

## 2017-04-19 ENCOUNTER — Ambulatory Visit (HOSPITAL_COMMUNITY)
Admission: RE | Admit: 2017-04-19 | Discharge: 2017-04-19 | Disposition: A | Discharge status: Home / Self Care | Payer: Medicare Other | Source: Ambulatory Visit | Attending: Cardiovascular Disease | Admitting: Cardiovascular Disease

## 2017-04-19 DIAGNOSIS — Z7902 Long term (current) use of antithrombotics/antiplatelets: Secondary | ICD-10-CM | POA: Diagnosis not present

## 2017-04-19 DIAGNOSIS — Z794 Long term (current) use of insulin: Secondary | ICD-10-CM | POA: Insufficient documentation

## 2017-04-19 DIAGNOSIS — E119 Type 2 diabetes mellitus without complications: Secondary | ICD-10-CM | POA: Insufficient documentation

## 2017-04-19 DIAGNOSIS — I2510 Atherosclerotic heart disease of native coronary artery without angina pectoris: Secondary | ICD-10-CM | POA: Diagnosis not present

## 2017-04-19 DIAGNOSIS — Z955 Presence of coronary angioplasty implant and graft: Secondary | ICD-10-CM | POA: Insufficient documentation

## 2017-04-19 DIAGNOSIS — I25118 Atherosclerotic heart disease of native coronary artery with other forms of angina pectoris: Secondary | ICD-10-CM | POA: Diagnosis present

## 2017-04-19 DIAGNOSIS — I2582 Chronic total occlusion of coronary artery: Secondary | ICD-10-CM | POA: Insufficient documentation

## 2017-04-19 DIAGNOSIS — L88 Pyoderma gangrenosum: Secondary | ICD-10-CM | POA: Insufficient documentation

## 2017-04-19 DIAGNOSIS — I252 Old myocardial infarction: Secondary | ICD-10-CM | POA: Insufficient documentation

## 2017-04-19 DIAGNOSIS — Z886 Allergy status to analgesic agent status: Secondary | ICD-10-CM | POA: Diagnosis not present

## 2017-04-19 DIAGNOSIS — R079 Chest pain, unspecified: Secondary | ICD-10-CM | POA: Diagnosis present

## 2017-04-19 DIAGNOSIS — E785 Hyperlipidemia, unspecified: Secondary | ICD-10-CM | POA: Diagnosis not present

## 2017-04-19 DIAGNOSIS — Z87891 Personal history of nicotine dependence: Secondary | ICD-10-CM | POA: Insufficient documentation

## 2017-04-19 DIAGNOSIS — Z79899 Other long term (current) drug therapy: Secondary | ICD-10-CM | POA: Diagnosis not present

## 2017-04-19 DIAGNOSIS — I25119 Atherosclerotic heart disease of native coronary artery with unspecified angina pectoris: Secondary | ICD-10-CM | POA: Diagnosis present

## 2017-04-19 DIAGNOSIS — Z96653 Presence of artificial knee joint, bilateral: Secondary | ICD-10-CM | POA: Diagnosis not present

## 2017-04-19 DIAGNOSIS — Z8614 Personal history of Methicillin resistant Staphylococcus aureus infection: Secondary | ICD-10-CM | POA: Insufficient documentation

## 2017-04-19 DIAGNOSIS — I10 Essential (primary) hypertension: Secondary | ICD-10-CM | POA: Insufficient documentation

## 2017-04-19 HISTORY — PX: LEFT HEART CATH AND CORONARY ANGIOGRAPHY: CATH118249

## 2017-04-19 LAB — CBC
HEMATOCRIT: 47.1 % — AB (ref 36.0–46.0)
Hemoglobin: 16.7 g/dL — ABNORMAL HIGH (ref 12.0–15.0)
MCH: 32.1 pg (ref 26.0–34.0)
MCHC: 35.5 g/dL (ref 30.0–36.0)
MCV: 90.4 fL (ref 78.0–100.0)
Platelets: 239 10*3/uL (ref 150–400)
RBC: 5.21 MIL/uL — AB (ref 3.87–5.11)
RDW: 13.6 % (ref 11.5–15.5)
WBC: 11.4 10*3/uL — AB (ref 4.0–10.5)

## 2017-04-19 LAB — BASIC METABOLIC PANEL
ANION GAP: 9 (ref 5–15)
BUN: 12 mg/dL (ref 6–20)
CHLORIDE: 97 mmol/L — AB (ref 101–111)
CO2: 24 mmol/L (ref 22–32)
CREATININE: 0.67 mg/dL (ref 0.44–1.00)
Calcium: 9.4 mg/dL (ref 8.9–10.3)
GFR calc non Af Amer: >60 mL/min (ref >60)
Glucose, Bld: 319 mg/dL — ABNORMAL HIGH (ref 65–99)
Potassium: 4.4 mmol/L (ref 3.5–5.1)
SODIUM: 130 mmol/L — AB (ref 135–145)

## 2017-04-19 LAB — PROTIME-INR
INR: 1.07
Prothrombin Time: 13.8 seconds (ref 11.4–15.2)

## 2017-04-19 LAB — PREGNANCY, URINE: Preg Test, Ur: NEGATIVE

## 2017-04-19 LAB — GLUCOSE, CAPILLARY
GLUCOSE-CAPILLARY: 406 mg/dL — AB (ref 65–99)
GLUCOSE-CAPILLARY: 304 mg/dL — AB (ref 65–99)
Glucose-Capillary: 357 mg/dL — ABNORMAL HIGH (ref 65–99)
Glucose-Capillary: 311 mg/dL — ABNORMAL HIGH (ref 65–99)

## 2017-04-19 SURGERY — LEFT HEART CATH AND CORONARY ANGIOGRAPHY
Anesthesia: LOCAL

## 2017-04-19 MED ORDER — LIDOCAINE HCL (PF) 1 % IJ SOLN
INTRAMUSCULAR | Status: DC | PRN
  Administered 2017-04-19: 2 mL

## 2017-04-19 MED ORDER — SODIUM CHLORIDE 0.9% FLUSH: 3.0000 mL | INTRAVENOUS | Status: DC | PRN

## 2017-04-19 MED ORDER — SODIUM CHLORIDE 0.9 % WEIGHT BASED INFUSION
3.0000 mL/kg/h | INTRAVENOUS | Status: AC
  Administered 2017-04-19: 3 mL/kg/h via INTRAVENOUS

## 2017-04-19 MED ORDER — MIDAZOLAM HCL 2 MG/2ML IJ SOLN
INTRAMUSCULAR | Status: AC
  Filled 2017-04-19: qty 2

## 2017-04-19 MED ORDER — LIDOCAINE HCL (PF) 1 % IJ SOLN
INTRAMUSCULAR | Status: AC
  Filled 2017-04-19: qty 30

## 2017-04-19 MED ORDER — HEPARIN SODIUM (PORCINE) 1000 UNIT/ML IJ SOLN
INTRAMUSCULAR | Status: DC | PRN
  Administered 2017-04-19: 6000 [IU] via INTRAVENOUS

## 2017-04-19 MED ORDER — VERAPAMIL HCL 2.5 MG/ML IV SOLN
INTRAVENOUS | Status: DC | PRN
  Administered 2017-04-19: 10 mL via INTRA_ARTERIAL

## 2017-04-19 MED ORDER — FENTANYL CITRATE (PF) 100 MCG/2ML IJ SOLN
INTRAMUSCULAR | Status: DC | PRN
  Administered 2017-04-19 (×2): 25 ug via INTRAVENOUS

## 2017-04-19 MED ORDER — DIPHENHYDRAMINE HCL 50 MG/ML IJ SOLN
50.0000 mg | Freq: Once | INTRAMUSCULAR | Status: AC
  Administered 2017-04-19: 50 mg via INTRAVENOUS

## 2017-04-19 MED ORDER — SODIUM CHLORIDE 0.9 % IV SOLN: 250.0000 mL | INTRAVENOUS | Status: DC | PRN

## 2017-04-19 MED ORDER — DIPHENHYDRAMINE HCL 50 MG/ML IJ SOLN
INTRAMUSCULAR | Status: AC
  Administered 2017-04-19: 50 mg via INTRAVENOUS
  Filled 2017-04-19: qty 1

## 2017-04-19 MED ORDER — SODIUM CHLORIDE 0.9 % WEIGHT BASED INFUSION: 1.0000 mL/kg/h | INTRAVENOUS | Status: DC

## 2017-04-19 MED ORDER — INSULIN ASPART 100 UNIT/ML ~~LOC~~ SOLN
5.0000 [IU] | Freq: Once | SUBCUTANEOUS | Status: AC
  Administered 2017-04-19: 5 [IU] via SUBCUTANEOUS

## 2017-04-19 MED ORDER — MIDAZOLAM HCL 2 MG/2ML IJ SOLN
INTRAMUSCULAR | Status: DC | PRN
  Administered 2017-04-19 (×2): 1 mg via INTRAVENOUS

## 2017-04-19 MED ORDER — SODIUM CHLORIDE 0.9% FLUSH: 3.0000 mL | Freq: Two times a day (BID) | INTRAVENOUS | Status: DC

## 2017-04-19 MED ORDER — FENTANYL CITRATE (PF) 100 MCG/2ML IJ SOLN
INTRAMUSCULAR | Status: AC
  Filled 2017-04-19: qty 2

## 2017-04-19 MED ORDER — IOPAMIDOL (ISOVUE-370) INJECTION 76%
INTRAVENOUS | Status: AC
  Filled 2017-04-19: qty 100

## 2017-04-19 MED ORDER — INSULIN ASPART 100 UNIT/ML ~~LOC~~ SOLN
SUBCUTANEOUS | Status: AC
  Filled 2017-04-19: qty 1

## 2017-04-19 MED ORDER — DIPHENHYDRAMINE HCL 25 MG PO CAPS: 50.0000 mg | ORAL_CAPSULE | Freq: Once | ORAL | Status: AC

## 2017-04-19 MED ORDER — HEPARIN (PORCINE) IN NACL 2-0.9 UNIT/ML-% IJ SOLN
INTRAMUSCULAR | Status: AC
  Filled 2017-04-19: qty 1000

## 2017-04-19 MED ORDER — HEPARIN (PORCINE) IN NACL 2-0.9 UNIT/ML-% IJ SOLN
INTRAMUSCULAR | Status: AC | PRN
  Administered 2017-04-19: 1000 mL

## 2017-04-19 MED ORDER — SODIUM CHLORIDE 0.9 % IV SOLN: INTRAVENOUS | Status: DC

## 2017-04-19 MED ORDER — VERAPAMIL HCL 2.5 MG/ML IV SOLN
INTRAVENOUS | Status: AC
  Filled 2017-04-19: qty 2

## 2017-04-19 MED ORDER — HEPARIN SODIUM (PORCINE) 1000 UNIT/ML IJ SOLN
INTRAMUSCULAR | Status: AC
  Filled 2017-04-19: qty 1

## 2017-04-19 MED ORDER — IOPAMIDOL (ISOVUE-370) INJECTION 76%
INTRAVENOUS | Status: DC | PRN
  Administered 2017-04-19: 70 mL via INTRA_ARTERIAL

## 2017-04-19 SURGICAL SUPPLY — 12 items
CATH 5FR JL3.5 JR4 ANG PIG MP (CATHETERS) ×1 IMPLANT
COVER PRB 48X5XTLSCP FOLD TPE (BAG) IMPLANT
COVER PROBE 5X48 (BAG) ×2
DEVICE RAD COMP TR BAND LRG (VASCULAR PRODUCTS) ×1 IMPLANT
GLIDESHEATH SLEND SS 6F .021 (SHEATH) ×1 IMPLANT
GUIDEWIRE INQWIRE 1.5J.035X260 (WIRE) IMPLANT
INQWIRE 1.5J .035X260CM (WIRE) ×2
KIT HEART LEFT (KITS) ×2 IMPLANT
PACK CARDIAC CATHETERIZATION (CUSTOM PROCEDURE TRAY) ×2 IMPLANT
SYR MEDRAD MARK V 150ML (SYRINGE) ×2 IMPLANT
TRANSDUCER W/STOPCOCK (MISCELLANEOUS) ×2 IMPLANT
TUBING CIL FLEX 10 FLL-RA (TUBING) ×2 IMPLANT

## 2017-04-19 NOTE — Interval H&P Note (Signed)
Cath Lab Visit (complete for each Cath Lab visit)  Clinical Evaluation Leading to the Procedure:   ACS: No.  Non-ACS:    Anginal Classification: CCS III  Anti-ischemic medical therapy: Maximal Therapy (2 or more classes of medications)  Non-Invasive Test Results: No non-invasive testing performed  Prior CABG: No previous CABG      History and Physical Interval Note:  04/19/2017 1:12 PM  Brittney Banks  has presented today for surgery, with the diagnosis of CP  The various methods of treatment have been discussed with the patient and family. After consideration of risks, benefits and other options for treatment, the patient has consented to  Procedure(s): LEFT HEART CATH AND CORONARY ANGIOGRAPHY (N/A) as a surgical intervention .  The patient's history has been reviewed, patient examined, no change in status, stable for surgery.  I have reviewed the patient's chart and labs.  Questions were answered to the patient's satisfaction.     Tonny Bollman

## 2017-04-19 NOTE — Discharge Instructions (Signed)

## 2017-04-20 ENCOUNTER — Encounter (HOSPITAL_COMMUNITY): Payer: Self-pay | Admitting: Cardiovascular Disease

## 2017-05-02 DIAGNOSIS — M5030 Other cervical disc degeneration, unspecified cervical region: Secondary | ICD-10-CM | POA: Diagnosis not present

## 2017-05-02 DIAGNOSIS — Z5181 Encounter for therapeutic drug level monitoring: Secondary | ICD-10-CM | POA: Diagnosis not present

## 2017-05-02 DIAGNOSIS — M5136 Other intervertebral disc degeneration, lumbar region: Secondary | ICD-10-CM | POA: Diagnosis not present

## 2017-05-02 DIAGNOSIS — G894 Chronic pain syndrome: Secondary | ICD-10-CM | POA: Diagnosis not present

## 2017-05-17 NOTE — Progress Notes (Signed)
Cardiology Office Note    Date:  05/18/2017   ID:  Brittney Banks, DOB January 05, 1971, MRN [id number]  PCP:  Ronita Hipps, MD  Cardiologist: Sinclair Grooms, MD   Chief Complaint  Patient presents with  . Coronary Artery Disease    History of Present Illness:  Brittney Banks is a 47 y.o. female has a hx of CAD with recent STEMI back in April of 2018.  She had emergent cath and found to have 100% RCA stenosis and had PCI with ONYX, DES and catheter based thrombectomy prior to stenting. Noted to have residual CAD. Had persisting chest discomfort for months following initial presentation and ultimately underwent PCI and stenting of a small caliber mid to distal LAD in July 2018.    Recurrent progressive angina developed in December.  Heart catheterization was performed by Dr. Burt Knack in early January and the dominant right coronary was totally occluded.  Duration was unknown.  This vessel was previously stented during STEMI in April 2018.  She continued to have angina and PCI/stenting of a tiny mid LAD was performed in July.  This improved angina only to have it recur in December.  She was placed on medical therapy after the most recent cath in January and now complains of near continuous chest tightness.   Past Medical History:  Diagnosis Date  . Allergy   . Behcet's disease [facility])    patient advised RN she has Behcet's disease  . Cellulitis 03/2016  . Coronary artery disease    11/09/16 PCI with DES to mLAD, patent RCA stent  . DDD (degenerative disc disease), lumbar   . Diabetes mellitus without complication ([place])    type 2  . Hyperlipidemia   . Hypertension   . LV dysfunction   . MRSA carrier 2008  . MRSA infection 2008   BOTH LEGS  . Myocardial infarction involving right coronary artery [facility])     Past Surgical History:  Procedure Laterality Date  . BILATERAL CARPAL TUNNEL RELEASE  2007  . CORONARY STENT INTERVENTION N/A 08/15/2016   Procedure: Coronary Stent Intervention;   Surgeon: Belva Crome, MD;  Location: [place] [facility];  Service: Cardiovascular;  Laterality: N/A;  . CORONARY STENT INTERVENTION N/A 11/09/2016   Procedure: Coronary Stent Intervention;  Surgeon: Belva Crome, MD;  Location: [place] [facility];  Service: Cardiovascular;  Laterality: N/A;  . ECTOPIC PREGNANCY SURGERY     2 SURGERIES  . LEFT HEART CATH AND CORONARY ANGIOGRAPHY N/A 08/15/2016   Procedure: Left Heart Cath and Coronary Angiography;  Surgeon: Belva Crome, MD;  Location: [place] [facility];  Service: Cardiovascular;  Laterality: N/A;  . LEFT HEART CATH AND CORONARY ANGIOGRAPHY N/A 11/09/2016   Procedure: Left Heart Cath and Coronary Angiography;  Surgeon: Belva Crome, MD;  Location: [place] [facility];  Service: Cardiovascular;  Laterality: N/A;  . LEFT HEART CATH AND CORONARY ANGIOGRAPHY N/A 04/19/2017   Procedure: LEFT HEART CATH AND CORONARY ANGIOGRAPHY;  Surgeon: Sherren Mocha, MD;  Location: [place] [facility];  Service: Cardiovascular;  Laterality: N/A;  . NECK SURGERY N/A 2008  . REPLACEMENT TOTAL KNEE BILATERAL Right   . TONSILLECTOMY    . ULTRASOUND GUIDANCE FOR VASCULAR ACCESS  11/09/2016   Procedure: Ultrasound Guidance For Vascular Access;  Surgeon: Belva Crome, MD;  Location: [place] [facility];  Service: Cardiovascular;;    Current Medications: Outpatient Medications Prior to Visit  Medication Sig Dispense Refill  .  carvedilol (COREG) 6.25 MG tablet Take 1 tablet (6.25 mg total) by mouth 2 (two) times daily. 180 tablet 3  . cetirizine (ZYRTEC) 10 MG tablet Take 10 mg by mouth daily.    . clopidogrel (PLAVIX) 75 MG tablet Take 1 tablet (75 mg total) by mouth daily. 30 tablet 6  . glucose monitoring kit (FREESTYLE) monitoring kit 1 each by Does not apply route as needed for other. Dispense any model that is covered- dispense testing supplies for Q AC/ HS accuchecks- 1 month supply with one refil. 1 each 1  . insulin aspart (NOVOLOG FLEXPEN) 100 UNIT/ML  FlexPen Inject 3-20 Units into the skin 3 (three) times daily with meals. CBG 70 - 120: 0 units  CBG 121 - 150: 3 units  CBG 151 - 200: 4 units  CBG 201 - 250: 7 units  CBG 251 - 300: 11 units  CBG 301 - 350: 15 units  CBG 351 - 400: 20 units (Patient taking differently: Inject 3-11 Units into the skin 3 (three) times daily with meals. ) 15 mL 11  . insulin detemir (LEVEMIR) 100 UNIT/ML injection Inject 0.2 mLs (20 Units total) into the skin at bedtime. 10 mL 11  . isosorbide mononitrate (IMDUR) 60 MG 24 hr tablet Take 60 mg by mouth daily.    Marland Kitchen lisinopril (PRINIVIL,ZESTRIL) 20 MG tablet Take 20 mg by mouth daily.    . metFORMIN (GLUCOPHAGE) 850 MG tablet Take 850 mg by mouth 3 (three) times daily with meals.    Marland Kitchen morphine (MS CONTIN) 60 MG 12 hr tablet Take 60 mg by mouth every 8 (eight) hours as needed for pain.    Marland Kitchen neomycin-bacitracin-polymyxin (NEOSPORIN) ointment Apply 1 application topically as needed for wound care.    . nitroGLYCERIN (NITROSTAT) 0.4 MG SL tablet Place 1 tablet (0.4 mg total) under the tongue every 5 (five) minutes x 3 doses as needed for chest pain. 25 tablet 3  . omeprazole (PRILOSEC) 20 MG capsule Take 20 mg by mouth daily.  3  . Oxycodone HCl 20 MG TABS Take 20 mg by mouth every 4 (four) hours as needed (pain).    Marland Kitchen oxymetazoline (AFRIN) 0.05 % nasal spray Place 1 spray into both nostrils daily as needed for congestion.    . rosuvastatin (CRESTOR) 20 MG tablet Take 20 mg by mouth daily.    . isosorbide mononitrate (IMDUR) 60 MG 24 hr tablet Take 1 tablet (60 mg total) by mouth daily. 90 tablet 3  . predniSONE (DELTASONE) 50 MG tablet Take 1 tablet (50 mg total) by mouth every 6 (six) hours. (Patient not taking: Reported on 04/13/2017) 3 tablet 0  . rosuvastatin (CRESTOR) 20 MG tablet Take 1 tablet (20 mg total) by mouth daily. 30 tablet 11   No facility-administered medications prior to visit.      Allergies:   Anaprox [naproxen sodium]; Aspirin; Brilinta  [ticagrelor]; Ceftin [cefuroxime axetil]; Celexa [citalopram hydrobromide]; Ciprofloxacin; Coconut fragrance; Contrast media [iodinated diagnostic agents]; Cortisol [hydrocortisone]; Elavil [amitriptyline]; Hctz [hydrochlorothiazide]; Imitrex [sumatriptan]; Lexapro [escitalopram oxalate]; Methadone; Metrizamide; Motrin [ibuprofen]; Naprosyn [naproxen]; Neurontin [gabapentin]; Penicillins; Sulfa antibiotics; Sulfasalazine; Tagamet [cimetidine]; Toradol [ketorolac tromethamine]; Vancomycin; Zoloft [sertraline hcl]; Colcrys [colchicine]; Iodine; and Lipitor [atorvastatin]   Social History   Socioeconomic History  . Marital status: Single    Spouse name: None  . Number of children: None  . Years of education: None  . Highest education level: None  Social Needs  . Financial resource strain: None  . Food insecurity -  worry: None  . Food insecurity - inability: None  . Transportation needs - medical: None  . Transportation needs - non-medical: None  Occupational History  . None  Tobacco Use  . Smoking status: Former Smoker    Last attempt to quit: 08/17/2010    Years since quitting: 6.7  . Smokeless tobacco: Never Used  Substance and Sexual Activity  . Alcohol use: No    Alcohol/week: 0.0 oz  . Drug use: No  . Sexual activity: None  Other Topics Concern  . None  Social History Narrative  . None     Family History:  The patient's She was adopted. Family history is unknown by patient.   ROS:   Please see the history of present illness.    Chest hurts almost continuously.  Shortness of breath, leg swelling, irregular heartbeat, fever, excessive fatigue, sweating, joint swelling, coated with balance, back pain. All other systems reviewed and are negative.   PHYSICAL EXAM:   VS:  BP 100/70   Pulse 98   Ht '5\' 9"'$  (1.753 m)   Wt 286 lb 9.6 oz (130 kg)   LMP  (LMP Unknown) Comment: partial hysterectomy  BMI 42.32 kg/m    BP 130/80 mmHg GEN: Well nourished, well developed, in no acute  distress morbidly obese. HEENT: normal  Neck: no JVD, carotid bruits, or masses Cardiac: RRR; no murmurs, rubs, or gallops,no edema  Respiratory:  clear to auscultation bilaterally, normal work of breathing GI: soft, nontender, nondistended, + BS MS: no deformity or atrophy  Skin: warm and dry, no rash Neuro:  Alert and Oriented x 3, Strength and sensation are intact Psych: euthymic mood, full affect  Wt Readings from Last 3 Encounters:  05/18/17 286 lb 9.6 oz (130 kg)  04/19/17 260 lb (117.9 kg)  03/20/17 287 lb 12.8 oz (130.5 kg)      Studies/Labs Reviewed:   EKG:  EKG repeated  Recent Labs: 08/15/2016: B Natriuretic Peptide 227.0; TSH 0.504 11/07/2016: ALT 42 04/19/2017: BUN 12; Creatinine, Ser 0.67; Hemoglobin 16.7; Platelets 239; Potassium 4.4; Sodium 130   Lipid Panel    Component Value Date/Time   CHOL 125 11/07/2016 1124   TRIG 206 (H) 11/07/2016 1124   HDL 37 (L) 11/07/2016 1124   CHOLHDL 3.4 11/07/2016 1124   CHOLHDL 6.9 08/16/2016 0342   VLDL 24 08/16/2016 0342   LDLCALC 47 11/07/2016 1124    Additional studies/ records that were reviewed today include:  Coronary angiography January 2019: Coronary Diagrams   Diagnostic Diagram           ASSESSMENT:    1. Coronary artery disease involving native coronary artery of native heart with angina pectoris ([place])   2. Essential hypertension   3. Chronic pain syndrome      PLAN:  In order of problems listed above:  1. Class III angina pectoris.  Heart rate is high.  Increase carvedilol to 12.5 mg twice daily.  Still having angina after 1 week, start Ranexa 500 mg twice daily.  Clinical follow-up in 3 weeks.  If you are unable to control angina with medical therapy, will refer for consideration of CTO PCI with Dr. [place] or Illene Bolus. 2. Blood pressure is adequate to increase beta-blocker therapy to hopefully further slow heart rate.  3-week follow-up.  Use nitro for episodes of pain.    Medication  Adjustments/Labs and Tests Ordered: Current medicines are reviewed at length with the patient today.  Concerns regarding medicines are outlined above.  Medication changes,  Labs and Tests ordered today are listed in the Patient Instructions below. There are no Patient Instructions on file for this visit.   Signed, Sinclair Grooms, MD  05/18/2017 3:25 PM    [facility] [place], [place], [place]  [address] Phone: [phone]; Fax: [phone]

## 2017-05-18 ENCOUNTER — Encounter: Payer: Self-pay | Admitting: Interventional Cardiology

## 2017-05-18 ENCOUNTER — Ambulatory Visit (INDEPENDENT_AMBULATORY_CARE_PROVIDER_SITE_OTHER): Payer: Medicare Other | Admitting: Interventional Cardiology

## 2017-05-18 VITALS — BP 100/70 | HR 98 | Ht 69.0 in | Wt 286.6 lb

## 2017-05-18 DIAGNOSIS — I209 Angina pectoris, unspecified: Secondary | ICD-10-CM

## 2017-05-18 DIAGNOSIS — G894 Chronic pain syndrome: Secondary | ICD-10-CM

## 2017-05-18 DIAGNOSIS — I25119 Atherosclerotic heart disease of native coronary artery with unspecified angina pectoris: Secondary | ICD-10-CM

## 2017-05-18 DIAGNOSIS — I10 Essential (primary) hypertension: Secondary | ICD-10-CM

## 2017-05-18 MED ORDER — RANOLAZINE ER 500 MG PO TB12: 500.0000 mg | ORAL_TABLET | Freq: Two times a day (BID) | ORAL | 6 refills | Status: DC

## 2017-05-18 MED ORDER — CARVEDILOL 12.5 MG PO TABS: 12.5000 mg | ORAL_TABLET | Freq: Two times a day (BID) | ORAL | 3 refills | Status: DC

## 2017-05-18 NOTE — Patient Instructions (Signed)
Medication Instructions:  1) INCREASE Carvedilol to 12.5mg  twice daily.  If chest pain/tightness does not improve after a week, then 2) START Ranexa 500mg  twice daily  Labwork: None  Testing/Procedures: None  Follow-Up: Your physician recommends that you schedule a follow-up appointment in: 2-3 weeks with Dr. Katrinka Blazing.    Any Other Special Instructions Will Be Listed Below (If Applicable).     If you need a refill on your cardiac medications before your next appointment, please call your pharmacy.

## 2017-05-30 DIAGNOSIS — M5030 Other cervical disc degeneration, unspecified cervical region: Secondary | ICD-10-CM | POA: Diagnosis not present

## 2017-05-30 DIAGNOSIS — M5136 Other intervertebral disc degeneration, lumbar region: Secondary | ICD-10-CM | POA: Diagnosis not present

## 2017-05-30 DIAGNOSIS — G894 Chronic pain syndrome: Secondary | ICD-10-CM | POA: Diagnosis not present

## 2017-05-30 DIAGNOSIS — Z5181 Encounter for therapeutic drug level monitoring: Secondary | ICD-10-CM | POA: Diagnosis not present

## 2017-06-06 NOTE — Progress Notes (Signed)
Cardiology Office Note    Date:  06/07/2017   ID:  [name], DOB 04/12/1971, MRN [id number]  PCP:  Brittney Hipps, MD  Cardiologist: Brittney Grooms, MD   Chief Complaint  Patient presents with  . Coronary Artery Disease    History of Present Illness:  Brittney Banks is a 47 y.o. female has a hx of CAD with recent STEMI back in April of 2018.  She had emergent cath and found to have 100% RCA stenosis and had PCI with ONYX, DES and catheter based thrombectomy prior to stenting. Noted to have residual CAD. Had persisting chest discomfort for months following initial presentation and ultimately underwent PCI and stenting of a small caliber mid to distal LAD in July 2018.    Was not able to tolerate Ranexa.  Has headache associated with nitroglycerin and isosorbide.  She continues with isosorbide but discontinued Ranexa.  When physically active chest discomfort is nearly continuous.  She is occasionally awakened from sleep by chest discomfort.  Nitroglycerin relieves each variety.  Management of symptoms are markedly impaired by long list of medication intolerances.  Chronic inflammatory autoimmune diseases are probably aggravating her underlying coronary disease: Behcet's and pyodermic gangrenosum.   Past Medical History:  Diagnosis Date  . Allergy   . Behcet's disease [facility])    patient advised RN she has Behcet's disease  . Cellulitis 03/2016  . Coronary artery disease    11/09/16 PCI with DES to mLAD, patent RCA stent  . DDD (degenerative disc disease), lumbar   . Diabetes mellitus without complication ([place])    type 2  . Hyperlipidemia   . Hypertension   . LV dysfunction   . MRSA carrier 2008  . MRSA infection 2008   BOTH LEGS  . Myocardial infarction involving right coronary artery [facility])     Past Surgical History:  Procedure Laterality Date  . BILATERAL CARPAL TUNNEL RELEASE  2007  . CORONARY STENT INTERVENTION N/A 08/15/2016   Procedure: Coronary Stent  Intervention;  Surgeon: Brittney Crome, MD;  Location: [place] [facility];  Service: Cardiovascular;  Laterality: N/A;  . CORONARY STENT INTERVENTION N/A 11/09/2016   Procedure: Coronary Stent Intervention;  Surgeon: Brittney Crome, MD;  Location: [place] [facility];  Service: Cardiovascular;  Laterality: N/A;  . ECTOPIC PREGNANCY SURGERY     2 SURGERIES  . LEFT HEART CATH AND CORONARY ANGIOGRAPHY N/A 08/15/2016   Procedure: Left Heart Cath and Coronary Angiography;  Surgeon: Brittney Crome, MD;  Location: [place] [facility];  Service: Cardiovascular;  Laterality: N/A;  . LEFT HEART CATH AND CORONARY ANGIOGRAPHY N/A 11/09/2016   Procedure: Left Heart Cath and Coronary Angiography;  Surgeon: Brittney Crome, MD;  Location: [place] [facility];  Service: Cardiovascular;  Laterality: N/A;  . LEFT HEART CATH AND CORONARY ANGIOGRAPHY N/A 04/19/2017   Procedure: LEFT HEART CATH AND CORONARY ANGIOGRAPHY;  Surgeon: Brittney Mocha, MD;  Location: [place] [facility];  Service: Cardiovascular;  Laterality: N/A;  . NECK SURGERY N/A 2008  . REPLACEMENT TOTAL KNEE BILATERAL Right   . TONSILLECTOMY    . ULTRASOUND GUIDANCE FOR VASCULAR ACCESS  11/09/2016   Procedure: Ultrasound Guidance For Vascular Access;  Surgeon: Brittney Crome, MD;  Location: [place] [facility];  Service: Cardiovascular;;    Current Medications: Outpatient Medications Prior to Visit  Medication Sig Dispense Refill  . carvedilol (COREG) 12.5 MG tablet Take 1 tablet (12.5 mg total) by mouth 2 (  two) times daily. 180 tablet 3  . cetirizine (ZYRTEC) 10 MG tablet Take 10 mg by mouth daily.    . clopidogrel (PLAVIX) 75 MG tablet Take 1 tablet (75 mg total) by mouth daily. 30 tablet 6  . glucose monitoring kit (FREESTYLE) monitoring kit 1 each by Does not apply route as needed for other. Dispense any model that is covered- dispense testing supplies for Q AC/ HS accuchecks- 1 month supply with one refil. 1 each 1  . insulin aspart (NOVOLOG FLEXPEN) 100  UNIT/ML FlexPen Inject 3-20 Units into the skin 3 (three) times daily with meals. CBG 70 - 120: 0 units  CBG 121 - 150: 3 units  CBG 151 - 200: 4 units  CBG 201 - 250: 7 units  CBG 251 - 300: 11 units  CBG 301 - 350: 15 units  CBG 351 - 400: 20 units (Patient taking differently: Inject 3-11 Units into the skin 3 (three) times daily with meals. ) 15 mL 11  . insulin detemir (LEVEMIR) 100 UNIT/ML injection Inject 0.2 mLs (20 Units total) into the skin at bedtime. 10 mL 11  . lisinopril (PRINIVIL,ZESTRIL) 20 MG tablet Take 20 mg by mouth daily.    . metFORMIN (GLUCOPHAGE) 850 MG tablet Take 850 mg by mouth 3 (three) times daily with meals.    Marland Kitchen morphine (MS CONTIN) 60 MG 12 hr tablet Take 60 mg by mouth every 8 (eight) hours as needed for pain.    Marland Kitchen neomycin-bacitracin-polymyxin (NEOSPORIN) ointment Apply 1 application topically as needed for wound care.    . nitroGLYCERIN (NITROSTAT) 0.4 MG SL tablet Place 1 tablet (0.4 mg total) under the tongue every 5 (five) minutes x 3 doses as needed for chest pain. 25 tablet 3  . omeprazole (PRILOSEC) 20 MG capsule Take 20 mg by mouth daily.  3  . Oxycodone HCl 20 MG TABS Take 20 mg by mouth every 4 (four) hours as needed (pain).    Marland Kitchen oxymetazoline (AFRIN) 0.05 % nasal spray Place 1 spray into both nostrils daily as needed for congestion.    . rosuvastatin (CRESTOR) 20 MG tablet Take 20 mg by mouth daily.    . isosorbide mononitrate (IMDUR) 60 MG 24 hr tablet Take 60 mg by mouth daily.    . ranolazine (RANEXA) 500 MG 12 hr tablet Take 1 tablet (500 mg total) by mouth 2 (two) times daily. (Patient not taking: Reported on 06/07/2017) 60 tablet 6   No facility-administered medications prior to visit.      Allergies:   Anaprox [naproxen sodium]; Aspirin; Brilinta [ticagrelor]; Ceftin [cefuroxime axetil]; Celexa [citalopram hydrobromide]; Ciprofloxacin; Coconut fragrance; Contrast media [iodinated diagnostic agents]; Cortisol [hydrocortisone]; Elavil  [amitriptyline]; Hctz [hydrochlorothiazide]; Imitrex [sumatriptan]; Lexapro [escitalopram oxalate]; Methadone; Metrizamide; Motrin [ibuprofen]; Naprosyn [naproxen]; Neurontin [gabapentin]; Penicillins; Sulfa antibiotics; Sulfasalazine; Tagamet [cimetidine]; Toradol [ketorolac tromethamine]; Vancomycin; Zoloft [sertraline hcl]; Colcrys [colchicine]; Iodine; Lipitor [atorvastatin]; and Ranexa [ranolazine]   Social History   Socioeconomic History  . Marital status: Single    Spouse name: None  . Number of children: None  . Years of education: None  . Highest education level: None  Social Needs  . Financial resource strain: None  . Food insecurity - worry: None  . Food insecurity - inability: None  . Transportation needs - medical: None  . Transportation needs - non-medical: None  Occupational History  . None  Tobacco Use  . Smoking status: Former Smoker    Last attempt to quit: 08/17/2010    Years since  quitting: 6.8  . Smokeless tobacco: Never Used  Substance and Sexual Activity  . Alcohol use: No    Alcohol/week: 0.0 oz  . Drug use: No  . Sexual activity: None  Other Topics Concern  . None  Social History Narrative  . None     Family History:  The patient's She was adopted. Family history is unknown by patient.   ROS:   Please see the history of present illness.    Also see about having CTO PCI.  Somewhat depressed by degree of chest discomfort and limitations.  States she has not smoking but smells heavily of tobacco. All other systems reviewed and are negative.   PHYSICAL EXAM:   VS:  BP 100/66   Pulse 97   Ht '5\' 9"'$  (1.753 m)   Wt 289 lb 3.2 oz (131.2 kg)   LMP  (LMP Unknown) Comment: partial hysterectomy  BMI 42.71 kg/m    GEN: Well nourished, well developed, in no acute distress  HEENT: normal  Neck: no JVD, carotid bruits, or masses Cardiac: RRR; no murmurs, rubs, or gallops,no edema  Respiratory:  clear to auscultation bilaterally, normal work of  breathing GI: soft, nontender, nondistended, + BS MS: no deformity or atrophy  Skin: warm and dry, no rash Neuro:  Alert and Oriented x 3, Strength and sensation are intact Psych: euthymic mood, full affect  Wt Readings from Last 3 Encounters:  06/07/17 289 lb 3.2 oz (131.2 kg)  05/18/17 286 lb 9.6 oz (130 kg)  04/19/17 260 lb (117.9 kg)      Studies/Labs Reviewed:   EKG:  EKG not repeated  Recent Labs: 08/15/2016: B Natriuretic Peptide 227.0; TSH 0.504 11/07/2016: ALT 42 04/19/2017: BUN 12; Creatinine, Ser 0.67; Hemoglobin 16.7; Platelets 239; Potassium 4.4; Sodium 130   Lipid Panel    Component Value Date/Time   CHOL 125 11/07/2016 1124   TRIG 206 (H) 11/07/2016 1124   HDL 37 (L) 11/07/2016 1124   CHOLHDL 3.4 11/07/2016 1124   CHOLHDL 6.9 08/16/2016 0342   VLDL 24 08/16/2016 0342   LDLCALC 47 11/07/2016 1124    Additional studies/ records that were reviewed today include:  No new data      ASSESSMENT:    1. Coronary artery disease involving native coronary artery of native heart with angina pectoris ([place])   2. Diabetes mellitus type 2 in obese ([place])   3. Essential hypertension   4. Chronic pain syndrome      PLAN:  In order of problems listed above:  1. Class III angina pectoris without medication control in the setting of chronically occluded, previously stented RCA.  RCA is collateralized.  Diffuse disease in the LAD.  Increase Imdur to 120 mg/day.  If blood pressure allows may increase the beta-blocker dose further even if it means decreasing lisinopril intensity. 2. Not addressed 3. Low normal blood pressure.  May need to back down on ACE inhibitor therapy to allow improved anti-anginal coverage. 4. Unchanged.  We discussed attempted CTO PCI.  She Has Ambiguous and therefore potential success rate is somewhat compromised.  I have encouraged her to allow up titration of anti-ischemic therapy and to consider CTO PCI if medical therapy fails.  This point we  have been compromised by medication intolerance.  4-6-week follow-up.    Medication Adjustments/Labs and Tests Ordered: Current medicines are reviewed at length with the patient today.  Concerns regarding medicines are outlined above.  Medication changes, Labs and Tests ordered today are listed in the  Patient Instructions below. Patient Instructions  Medication Instructions:  1) INCREASE Isosorbide to '120mg'$  once daily  Labwork: None  Testing/Procedures: None  Follow-Up: Your physician recommends that you schedule a follow-up appointment in: 4-6 weeks with a PA or NP.    Any Other Special Instructions Will Be Listed Below (If Applicable).     If you need a refill on your cardiac medications before your next appointment, please call your pharmacy.      Signed, Brittney Grooms, MD  06/07/2017 4:08 PM    [facility] [place], [place], [place]  [address] Phone: [phone]; Fax: [phone]

## 2017-06-07 ENCOUNTER — Ambulatory Visit (INDEPENDENT_AMBULATORY_CARE_PROVIDER_SITE_OTHER): Payer: Medicare Other | Admitting: Interventional Cardiology

## 2017-06-07 ENCOUNTER — Encounter: Payer: Self-pay | Admitting: Interventional Cardiology

## 2017-06-07 VITALS — BP 100/66 | HR 97 | Ht 69.0 in | Wt 289.2 lb

## 2017-06-07 DIAGNOSIS — E1169 Type 2 diabetes mellitus with other specified complication: Secondary | ICD-10-CM | POA: Diagnosis not present

## 2017-06-07 DIAGNOSIS — I10 Essential (primary) hypertension: Secondary | ICD-10-CM | POA: Diagnosis not present

## 2017-06-07 DIAGNOSIS — I25119 Atherosclerotic heart disease of native coronary artery with unspecified angina pectoris: Secondary | ICD-10-CM

## 2017-06-07 DIAGNOSIS — E669 Obesity, unspecified: Secondary | ICD-10-CM | POA: Diagnosis not present

## 2017-06-07 DIAGNOSIS — G894 Chronic pain syndrome: Secondary | ICD-10-CM

## 2017-06-07 IMAGING — DX DG CHEST 2V
2 series · 2 of 2 positions shown · non-contrast
Comparison: 11/07/2005.

CLINICAL DATA: Chest pain.  Shortness of breath .

EXAM:
CHEST  2 VIEW

[dg chest 2 view (1 of 2)]
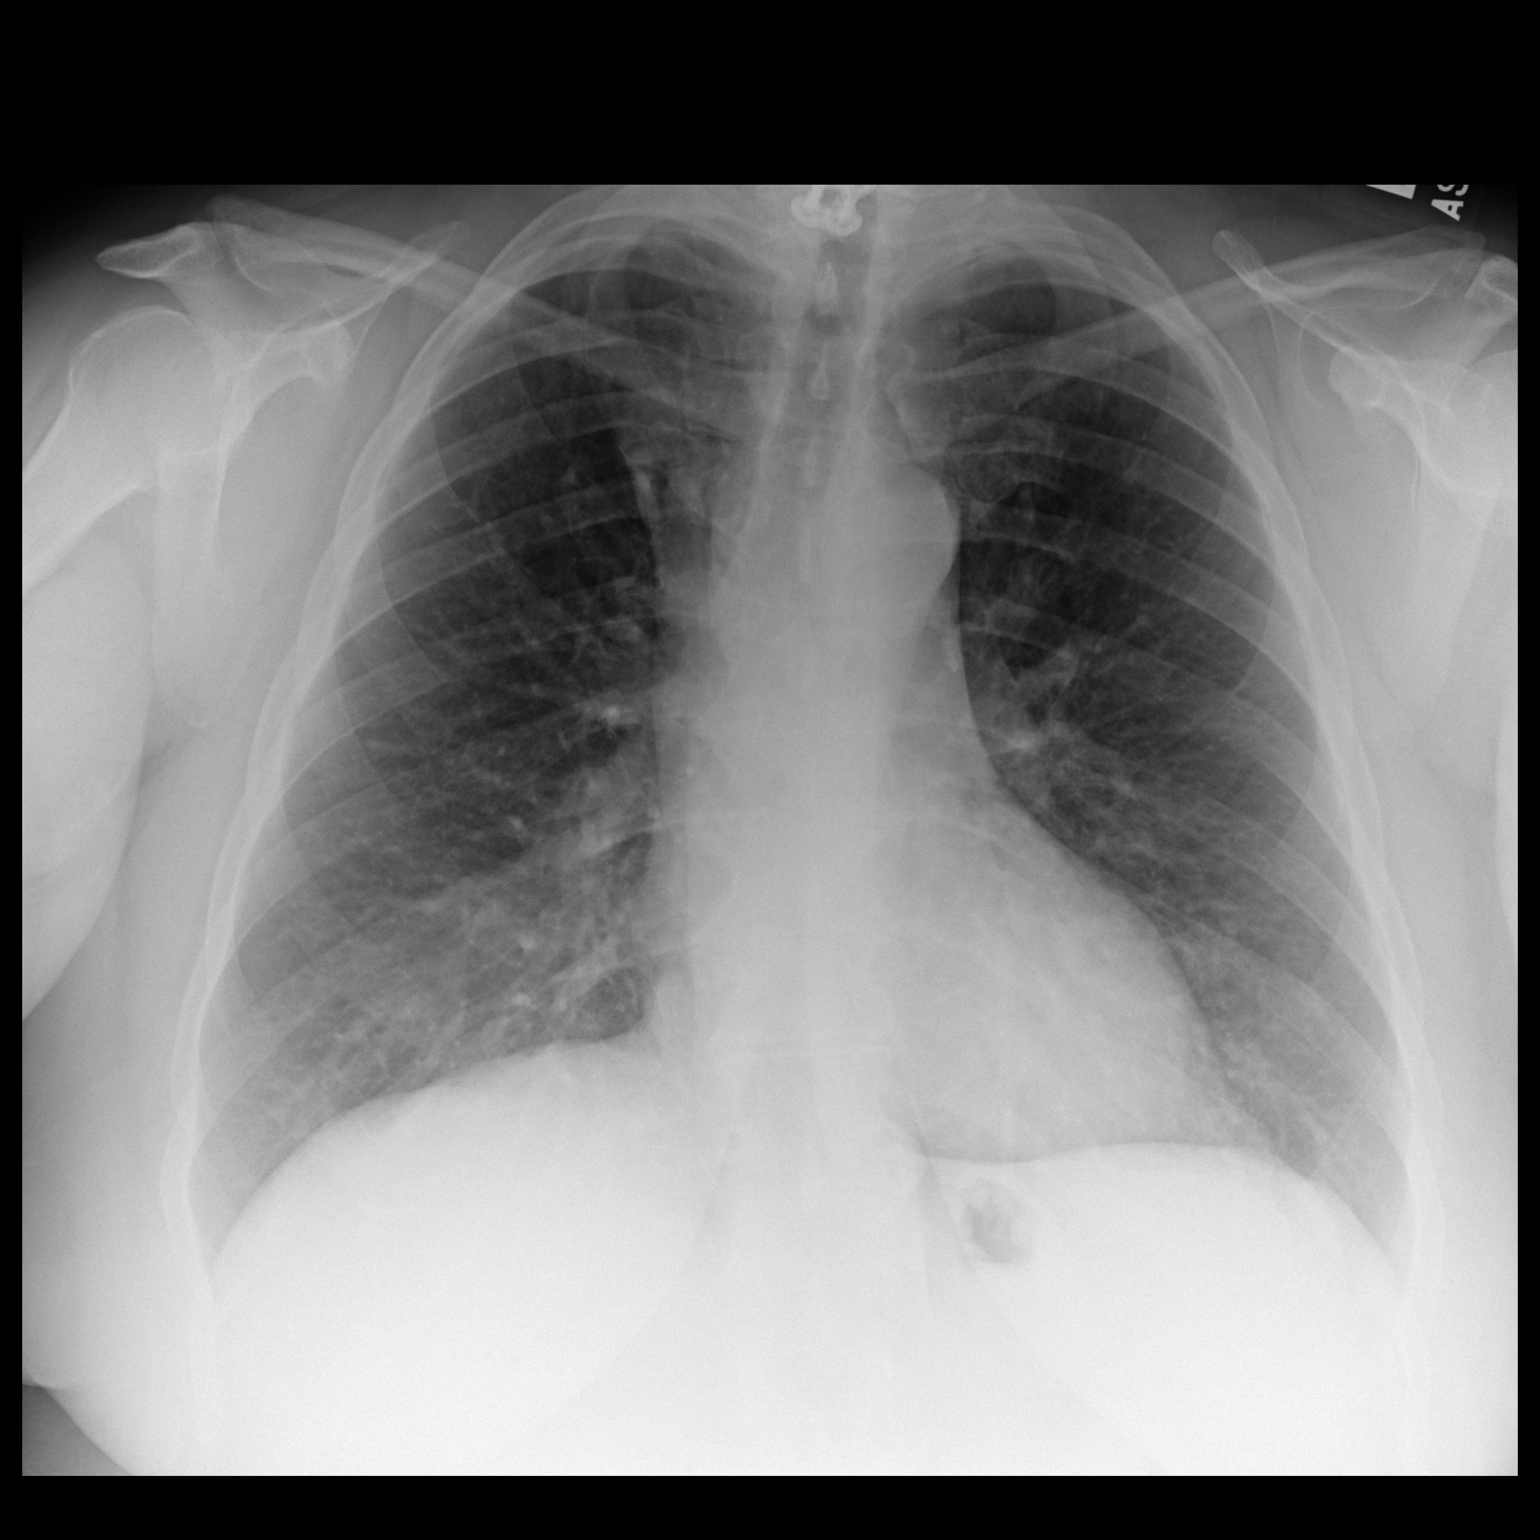

[dg chest 2 view (2 of 2)]
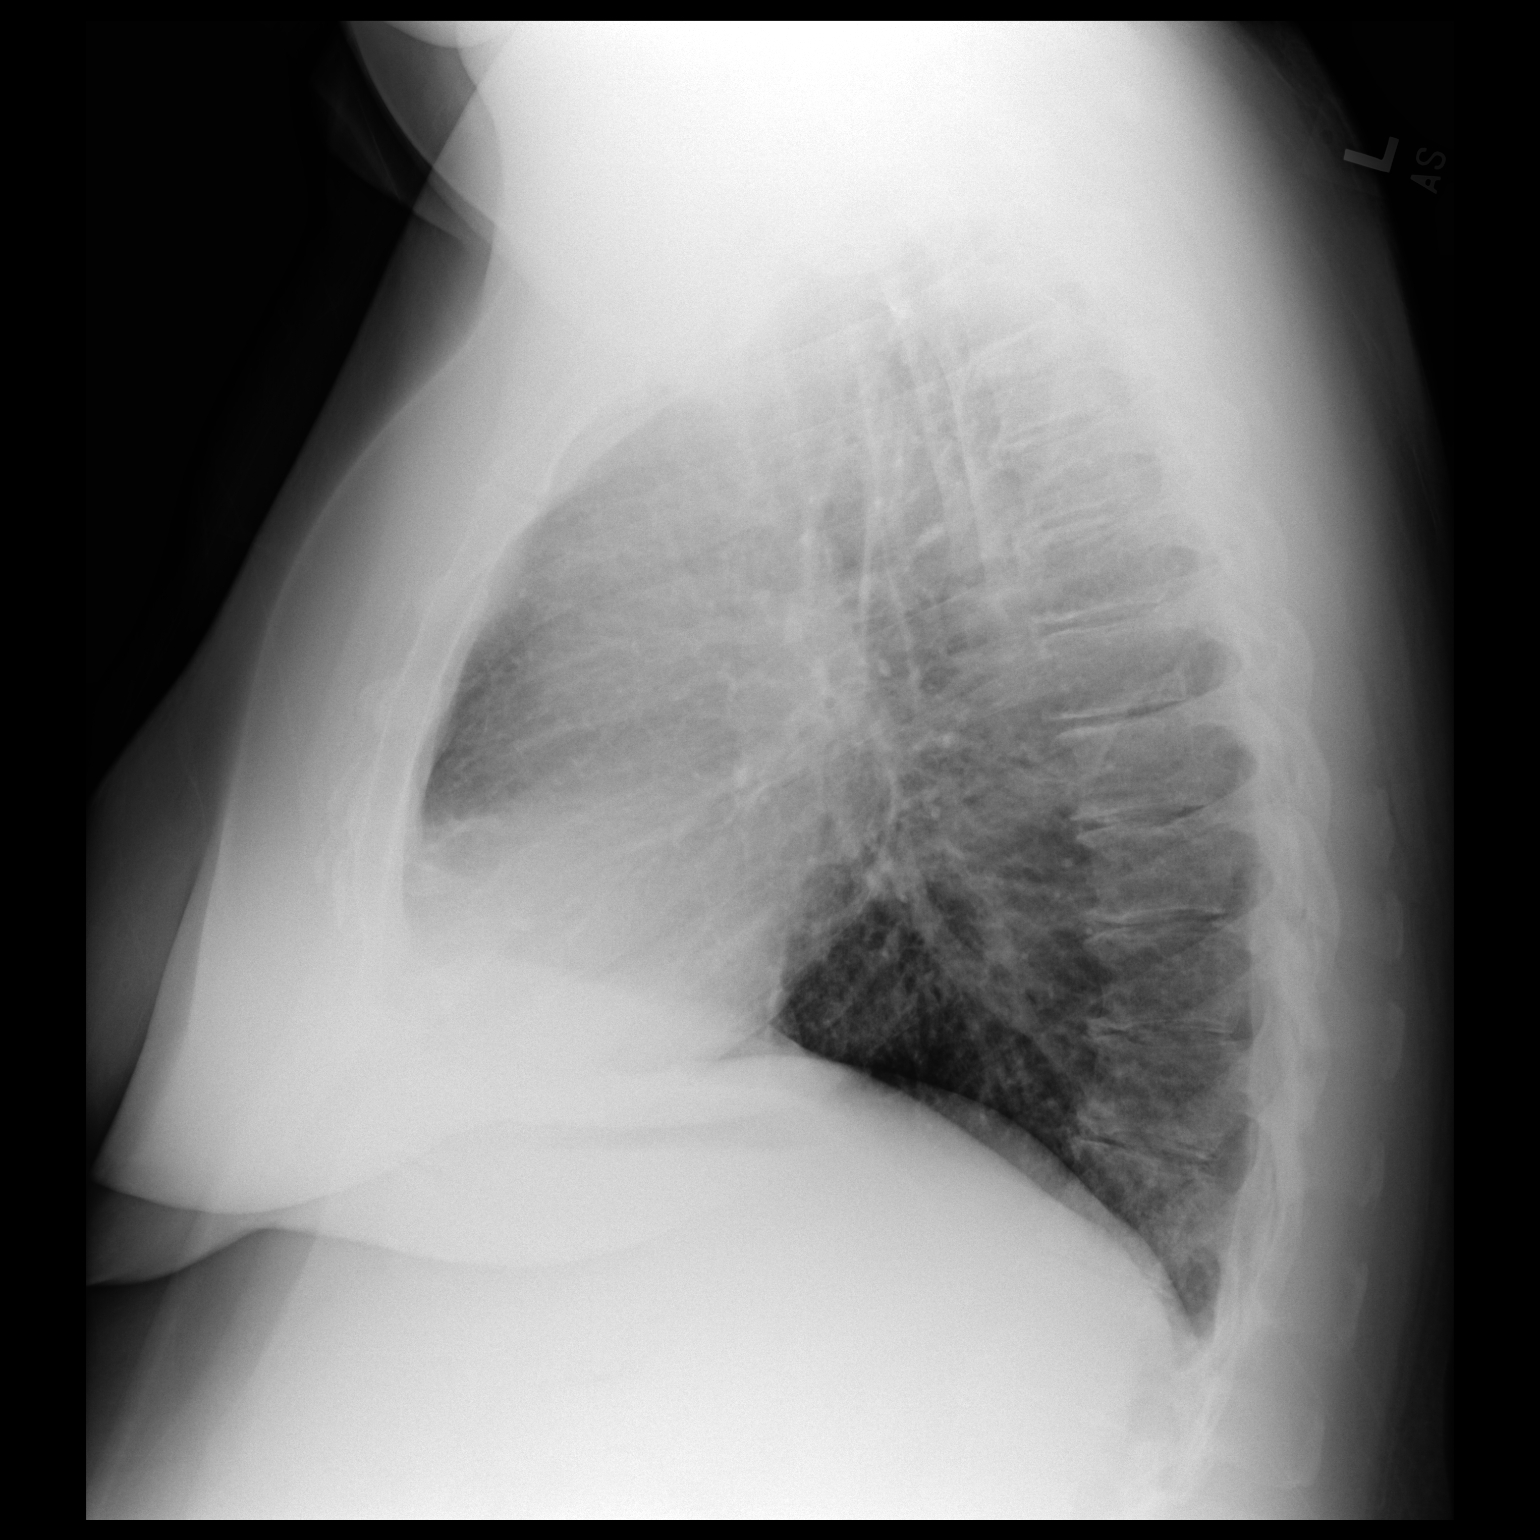

[2 of 2 positions shown; findings below may reference images not displayed]

FINDINGS: Mediastinum and hilar structures normal. Heart size normal. Mild
right base infiltrate, follow-up exam to demonstrate resolution
recommended. No pleural effusion pneumothorax. Cervical spine fusion
IMPRESSION: Mild right base infiltrate, follow-up exam to demonstrate resolution
recommended.

## 2017-06-07 MED ORDER — ISOSORBIDE MONONITRATE ER 60 MG PO TB24: 120.0000 mg | ORAL_TABLET | Freq: Every day | ORAL | 3 refills | Status: DC

## 2017-06-07 NOTE — Patient Instructions (Signed)
Medication Instructions:  1) INCREASE Isosorbide to 120mg  once daily  Labwork: None  Testing/Procedures: None  Follow-Up: Your physician recommends that you schedule a follow-up appointment in: 4-6 weeks with a PA or NP.    Any Other Special Instructions Will Be Listed Below (If Applicable).     If you need a refill on your cardiac medications before your next appointment, please call your pharmacy.

## 2017-06-21 IMAGING — DX DG CHEST 2V
2 series · 2 of 2 positions shown · non-contrast
Comparison: 09/14/2016.

CLINICAL DATA: 46-year-old female with shortness of breath
productive cough and chest tightness for 10 days. Recently diagnosed
with pneumonia and treated with antibiotics.

EXAM:
CHEST  2 VIEW

[chest pa]
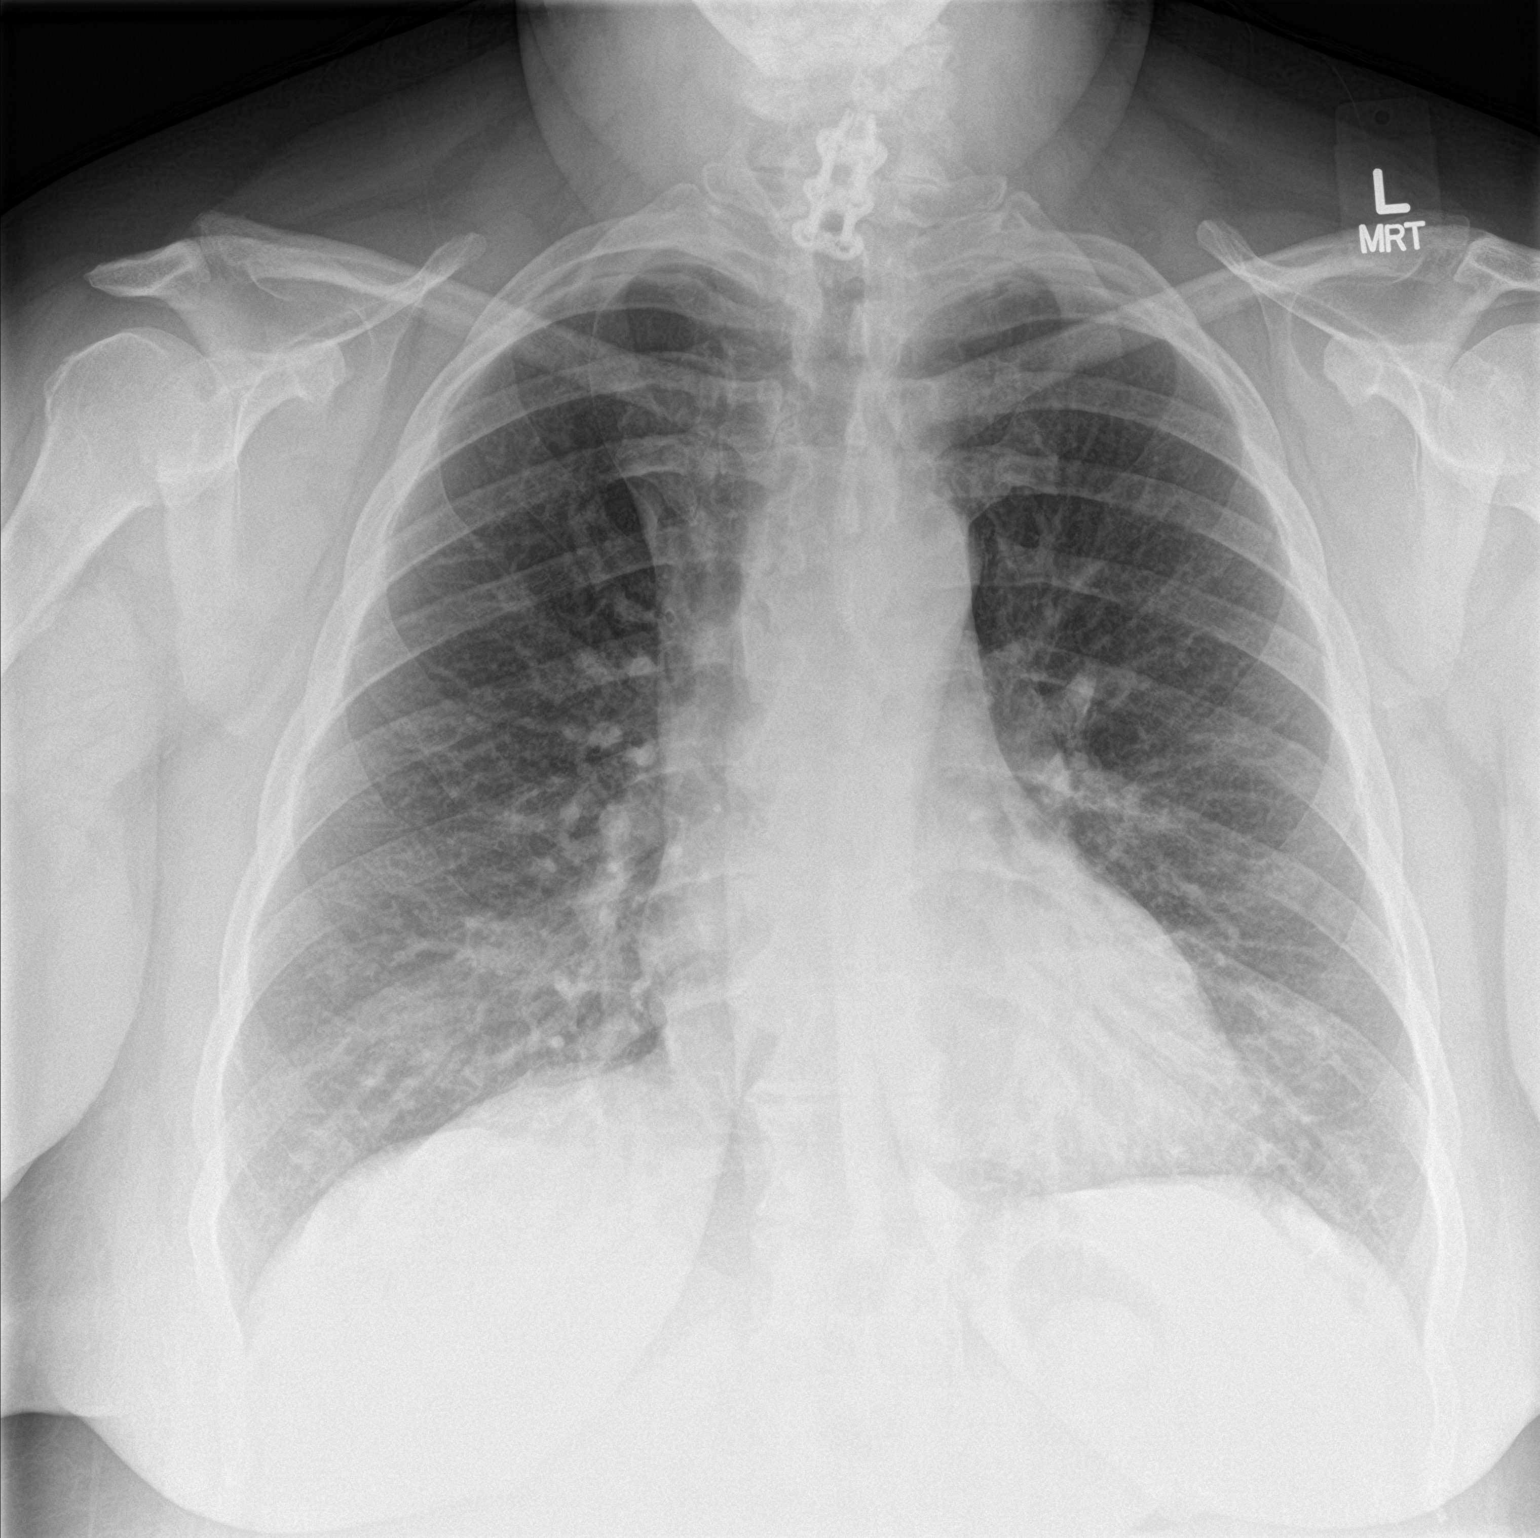

[chest lat]
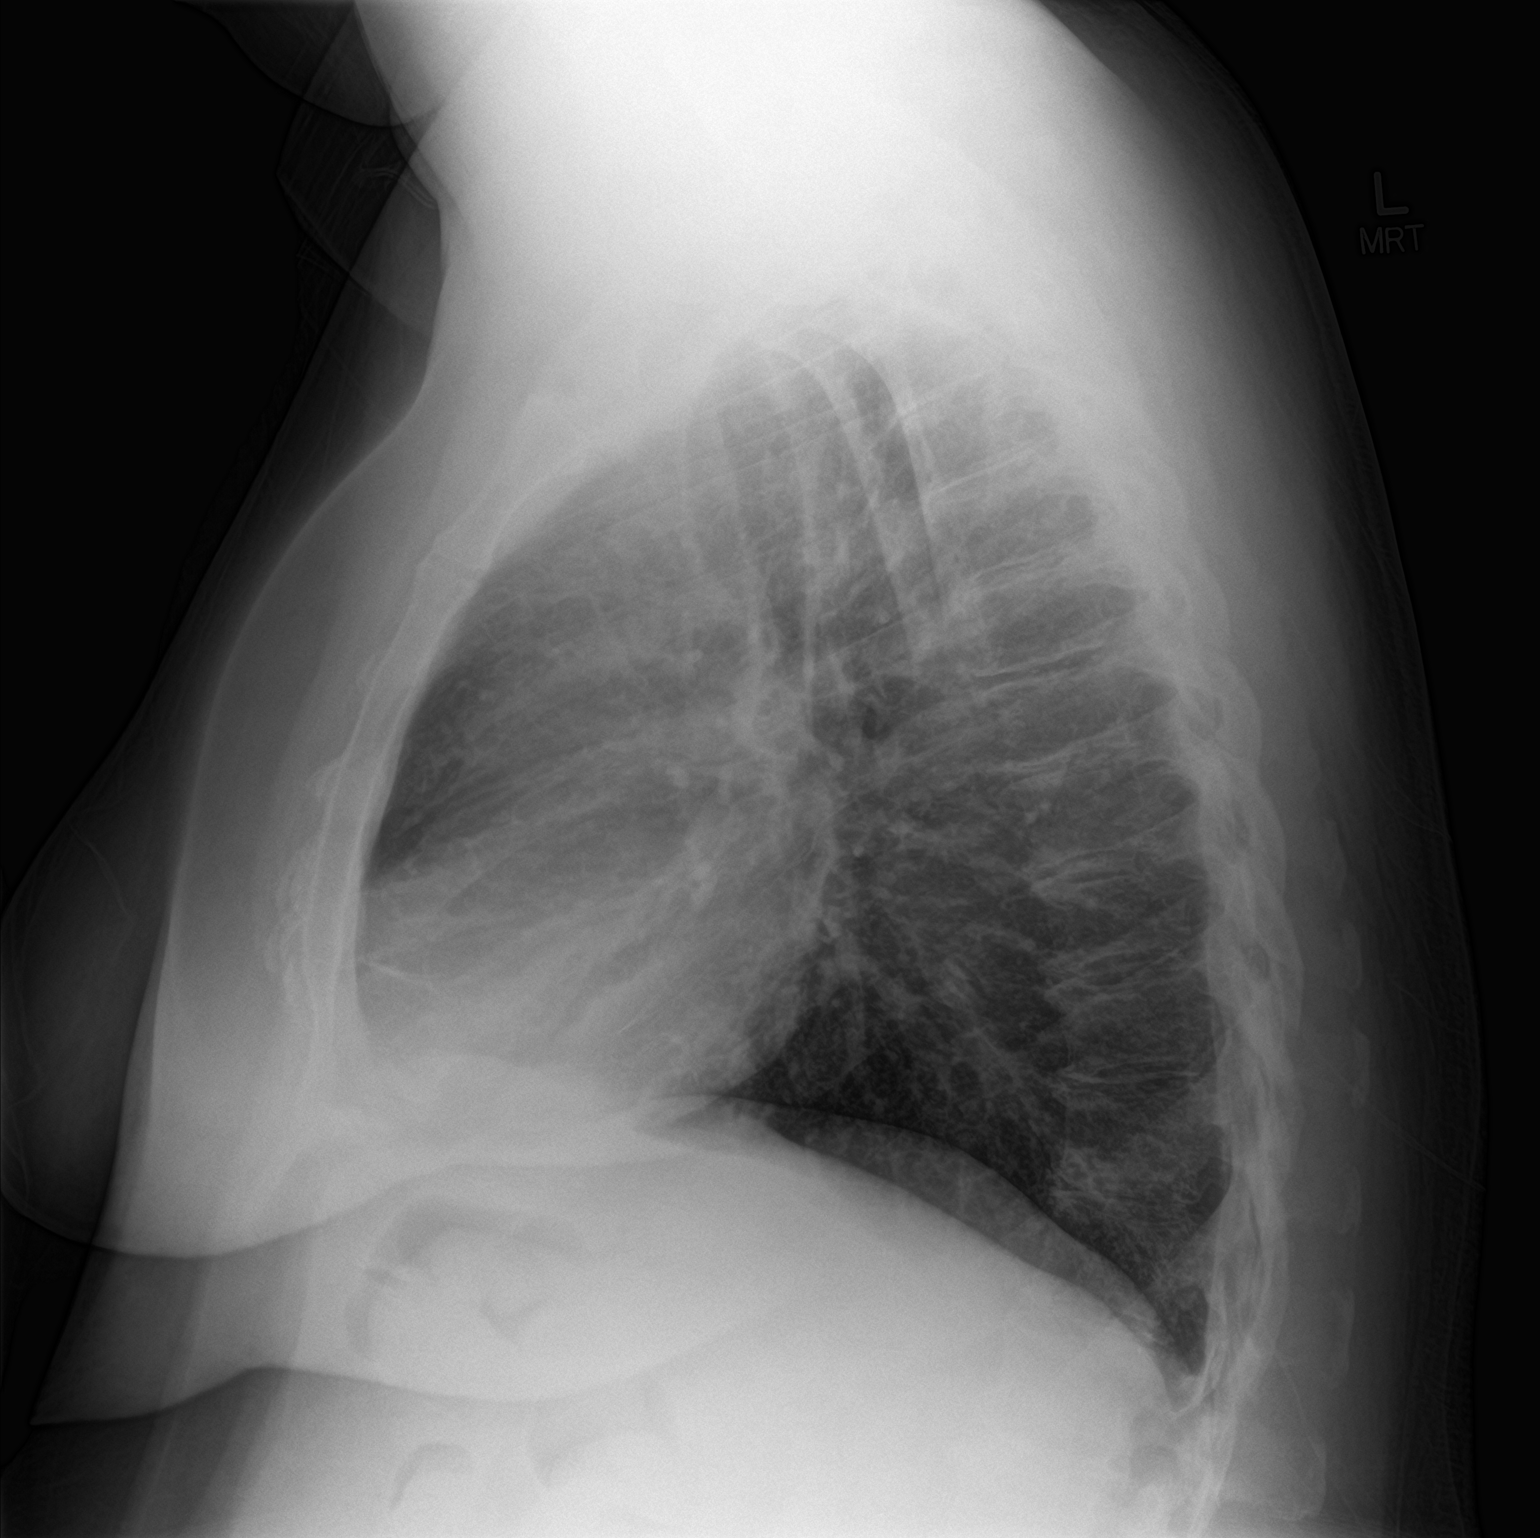

[2 of 2 positions shown; findings below may reference images not displayed]

FINDINGS: Lung volumes are stable and within normal limits. There is mildly
streaky bilateral lung base opacity with no consolidation or pleural
effusion. No other confluent pulmonary opacity. Mediastinal contours
remain within normal limits. Visualized tracheal air column is
within normal limits. Previous cervical ACDF. No acute osseous
abnormality identified. Negative visible bowel gas pattern.
IMPRESSION: Stable radiographic appearance of the chest, with clear lungs aside
from mild nonspecific streaky bibasilar opacity.
Considering the stability I favor lung base scarring or atelectasis
over bronchopneumonia.

## 2017-06-27 DIAGNOSIS — M5030 Other cervical disc degeneration, unspecified cervical region: Secondary | ICD-10-CM | POA: Diagnosis not present

## 2017-06-27 DIAGNOSIS — G894 Chronic pain syndrome: Secondary | ICD-10-CM | POA: Diagnosis not present

## 2017-06-27 DIAGNOSIS — Z5181 Encounter for therapeutic drug level monitoring: Secondary | ICD-10-CM | POA: Diagnosis not present

## 2017-06-27 DIAGNOSIS — M5136 Other intervertebral disc degeneration, lumbar region: Secondary | ICD-10-CM | POA: Diagnosis not present

## 2017-06-28 DIAGNOSIS — I10 Essential (primary) hypertension: Secondary | ICD-10-CM | POA: Diagnosis not present

## 2017-06-28 DIAGNOSIS — E119 Type 2 diabetes mellitus without complications: Secondary | ICD-10-CM | POA: Diagnosis not present

## 2017-06-28 DIAGNOSIS — K219 Gastro-esophageal reflux disease without esophagitis: Secondary | ICD-10-CM | POA: Diagnosis not present

## 2017-06-28 DIAGNOSIS — Z794 Long term (current) use of insulin: Secondary | ICD-10-CM | POA: Diagnosis not present

## 2017-07-14 ENCOUNTER — Ambulatory Visit: Payer: Medicare Other | Admitting: Cardiology

## 2017-07-25 DIAGNOSIS — G894 Chronic pain syndrome: Secondary | ICD-10-CM | POA: Diagnosis not present

## 2017-07-25 DIAGNOSIS — Z5181 Encounter for therapeutic drug level monitoring: Secondary | ICD-10-CM | POA: Diagnosis not present

## 2017-07-25 DIAGNOSIS — M5136 Other intervertebral disc degeneration, lumbar region: Secondary | ICD-10-CM | POA: Diagnosis not present

## 2017-07-25 DIAGNOSIS — M5030 Other cervical disc degeneration, unspecified cervical region: Secondary | ICD-10-CM | POA: Diagnosis not present

## 2017-08-07 NOTE — Telephone Encounter (Signed)
error 

## 2017-08-16 ENCOUNTER — Encounter: Payer: Self-pay | Admitting: Nurse Practitioner

## 2017-08-16 ENCOUNTER — Ambulatory Visit (INDEPENDENT_AMBULATORY_CARE_PROVIDER_SITE_OTHER): Payer: Medicare Other | Admitting: Nurse Practitioner

## 2017-08-16 VITALS — BP 120/70 | HR 96 | Ht 69.0 in | Wt 276.8 lb

## 2017-08-16 DIAGNOSIS — I25119 Atherosclerotic heart disease of native coronary artery with unspecified angina pectoris: Secondary | ICD-10-CM | POA: Diagnosis not present

## 2017-08-16 DIAGNOSIS — E782 Mixed hyperlipidemia: Secondary | ICD-10-CM | POA: Diagnosis not present

## 2017-08-16 DIAGNOSIS — I10 Essential (primary) hypertension: Secondary | ICD-10-CM | POA: Diagnosis not present

## 2017-08-16 DIAGNOSIS — G894 Chronic pain syndrome: Secondary | ICD-10-CM | POA: Diagnosis not present

## 2017-08-16 MED ORDER — CARVEDILOL 25 MG PO TABS: 25.0000 mg | ORAL_TABLET | Freq: Two times a day (BID) | ORAL | 3 refills | Status: DC

## 2017-08-16 NOTE — Patient Instructions (Addendum)
We will be checking the following labs today - NONE   Medication Instructions:    Continue with your current medicines. BUT  We are cutting the Lisinopril back to just 10 mg a day - this will be half of your 20 mg tablet  We are increasing the Coreg to 25 mg twice a day - you can take 2 of the 12.5 mg tablets twice a day and use up - the RX for the 25 mg is at the drug store.     Testing/Procedures To Be Arranged:  N/A  Follow-Up:   See Brittney Banks for consideration of CTO intervention  You are scheduled to see Brittney Banks June 11 at 3:40PM.    Other Special Instructions:   N/A    If you need a refill on your cardiac medications before your next appointment, please call your pharmacy.   Call the [facility] office at [phone] if you have any questions, problems or concerns.

## 2017-08-16 NOTE — Progress Notes (Signed)
CARDIOLOGY OFFICE NOTE  Date:  08/16/2017    Brittney Banks Date of Birth: [birth year] Medical Record #[id number]  PCP:  Ronita Hipps, MD  Cardiologist:  Jennings Books   Chief Complaint  Patient presents with  . Coronary Artery Disease  . Chest Pain  . Shortness of Breath    Seen for Dr. Tamala Julian    History of Present Illness: Brittney Banks is a 47 y.o. female who presents today for a follow up visit. Seen for Dr. Tamala Julian.   She has a hx ofCAD with recent STEMI back in April of 2018. She had emergent cath and found to have 100% RCA stenosis and had PCI with ONYX, DES and catheter based thrombectomy prior to stenting. Noted tohave residual CAD. Had persistent chest discomfort for months following initial presentation and ultimately underwent PCI and stenting of a small caliber mid to distal LAD in July 2018.  She has not been able to tolerate Ranexa. She does not tolerate NTG or Imdur due to headache. She has been able to continue Imdur but stopped Ranexa.   Seen last in February by Dr. Tamala Julian  - constant chest pain noted then with physical activity. Her management is markedly impaired by her allergies/intolerances.   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   Comes in today. Here alone today. She notes continued constant chest pain - basically unchanged since she was here last. She is taking probably 8 NTG every day - this is in addition to her Imdur. The med changes from last visit - really did not help. She is frustrated. She is trying to work on her weight - she is down 10 more pounds. She will be seeing a new dermatologist in [place] in May.   Past Medical History:  Diagnosis Date  . Allergy   . Behcet's disease [facility])    patient advised RN she has Behcet's disease  . Cellulitis 03/2016  . Coronary artery disease    11/09/16 PCI with DES to mLAD, patent RCA  stent  . DDD (degenerative disc disease), lumbar   . Diabetes mellitus without complication ([place])    type 2  . Hyperlipidemia   . Hypertension   . LV dysfunction   . MRSA carrier 2008  . MRSA infection 2008   BOTH LEGS  . Myocardial infarction involving right coronary artery [facility])     Past Surgical History:  Procedure Laterality Date  . BILATERAL CARPAL TUNNEL RELEASE  2007  . CORONARY STENT INTERVENTION N/A 08/15/2016   Procedure: Coronary Stent Intervention;  Surgeon: Belva Crome, MD;  Location: [place] [facility];  Service: Cardiovascular;  Laterality: N/A;  . CORONARY STENT INTERVENTION N/A 11/09/2016   Procedure: Coronary Stent Intervention;  Surgeon: Belva Crome, MD;  Location: [place] [facility];  Service: Cardiovascular;  Laterality: N/A;  . ECTOPIC PREGNANCY SURGERY     2 SURGERIES  . LEFT HEART CATH AND CORONARY ANGIOGRAPHY N/A 08/15/2016   Procedure: Left Heart Cath and Coronary Angiography;  Surgeon: Belva Crome, MD;  Location: [place] [facility];  Service: Cardiovascular;  Laterality: N/A;  . LEFT HEART CATH AND CORONARY ANGIOGRAPHY N/A 11/09/2016   Procedure: Left Heart Cath and Coronary Angiography;  Surgeon: Belva Crome, MD;  Location: [place] [facility];  Service: Cardiovascular;  Laterality: N/A;  . LEFT HEART CATH AND CORONARY ANGIOGRAPHY N/A 04/19/2017   Procedure:  LEFT HEART CATH AND CORONARY ANGIOGRAPHY;  Surgeon: Sherren Mocha, MD;  Location: [place] [facility];  Service: Cardiovascular;  Laterality: N/A;  . NECK SURGERY N/A 2008  . REPLACEMENT TOTAL KNEE BILATERAL Right   . TONSILLECTOMY    . ULTRASOUND GUIDANCE FOR VASCULAR ACCESS  11/09/2016   Procedure: Ultrasound Guidance For Vascular Access;  Surgeon: Belva Crome, MD;  Location: [place] [facility];  Service: Cardiovascular;;     Medications: Current Meds  Medication Sig  . benzonatate (TESSALON) 200 MG capsule Take 1 Capsule 3 times per day for cough  . cetirizine (ZYRTEC) 10 MG tablet Take  10 mg by mouth daily.  . clopidogrel (PLAVIX) 75 MG tablet Take 1 tablet (75 mg total) by mouth daily.  Marland Kitchen glucose monitoring kit (FREESTYLE) monitoring kit 1 each by Does not apply route as needed for other. Dispense any model that is covered- dispense testing supplies for Q AC/ HS accuchecks- 1 month supply with one refil.  . insulin aspart (NOVOLOG FLEXPEN) 100 UNIT/ML FlexPen Inject 3-20 Units into the skin 3 (three) times daily with meals. CBG 70 - 120: 0 units  CBG 121 - 150: 3 units  CBG 151 - 200: 4 units  CBG 201 - 250: 7 units  CBG 251 - 300: 11 units  CBG 301 - 350: 15 units  CBG 351 - 400: 20 units (Patient taking differently: Inject 3-11 Units into the skin 3 (three) times daily with meals. )  . insulin detemir (LEVEMIR) 100 UNIT/ML injection Inject 0.2 mLs (20 Units total) into the skin at bedtime.  . isosorbide mononitrate (IMDUR) 60 MG 24 hr tablet Take 2 tablets (120 mg total) by mouth daily.  Marland Kitchen lisinopril (PRINIVIL,ZESTRIL) 20 MG tablet Take 10 mg by mouth daily.  . metFORMIN (GLUCOPHAGE) 850 MG tablet Take 850 mg by mouth 3 (three) times daily with meals.  Marland Kitchen morphine (MS CONTIN) 60 MG 12 hr tablet Take 60 mg by mouth every 8 (eight) hours as needed for pain.  Marland Kitchen NARCAN 4 MG/0.1ML LIQD nasal spray kit Call EMS at first sign of opioid overdose. Inject spray into nostril. If no response in 2 minutes, inject opposite nostril.  Marland Kitchen neomycin-bacitracin-polymyxin (NEOSPORIN) ointment Apply 1 application topically as needed for wound care.  . nitroGLYCERIN (NITROSTAT) 0.4 MG SL tablet Place 1 tablet (0.4 mg total) under the tongue every 5 (five) minutes x 3 doses as needed for chest pain.  Marland Kitchen omeprazole (PRILOSEC) 20 MG capsule Take 20 mg by mouth daily.  . Oxycodone HCl 20 MG TABS Take 20 mg by mouth every 4 (four) hours as needed (pain).  Marland Kitchen oxymetazoline (AFRIN) 0.05 % nasal spray Place 1 spray into both nostrils daily as needed for congestion.  . rosuvastatin (CRESTOR) 20 MG tablet Take  20 mg by mouth daily.  . [DISCONTINUED] carvedilol (COREG) 12.5 MG tablet Take 1 tablet (12.5 mg total) by mouth 2 (two) times daily.     Allergies: Allergies  Allergen Reactions  . Anaprox [Naproxen Sodium] Shortness Of Breath    Redness of skin and difficulty breathing   . Aspirin Nausea And Vomiting    Severe cramping, vomiting   . Brilinta [Ticagrelor] Shortness Of Breath  . Ceftin [Cefuroxime Axetil] Shortness Of Breath    Skin turns red   . Celexa [Citalopram Hydrobromide] Shortness Of Breath  . Ciprofloxacin Shortness Of Breath    Skin turns red   . Coconut Fragrance Hives, Shortness Of Breath and Swelling  Reaction to any kind of coconut Mouth and tongue swelling  . Contrast Media [Iodinated Diagnostic Agents] Shortness Of Breath    Breathing difficulty  . Cortisol [Hydrocortisone] Shortness Of Breath    Breathing difficulty and redness of skin  . Elavil [Amitriptyline] Shortness Of Breath     Redness of skin and blisters of skin Difficulty breathing  . Hctz [Hydrochlorothiazide] Shortness Of Breath  . Imitrex [Sumatriptan] Shortness Of Breath    Redness and difficulty breathing   . Lexapro [Escitalopram Oxalate] Shortness Of Breath    Breathing difficulty Redness to skin  . Methadone Nausea And Vomiting    Severely vomiting   . Metrizamide Shortness Of Breath    Breathing difficulty  . Motrin [Ibuprofen] Nausea And Vomiting  . Naprosyn [Naproxen] Shortness Of Breath    Breathing difficulty Redness of skin  . Neurontin [Gabapentin] Shortness Of Breath    Breathing difficulty  Redness to skin Breathing difficulty  Redness to skin  . Penicillins Shortness Of Breath and Rash    Has patient had a PCN reaction causing immediate rash, facial/tongue/throat swelling, SOB or lightheadedness with hypotension: Yes Has patient had a PCN reaction causing severe rash involving mucus membranes or skin necrosis: Yes Has patient had a PCN reaction that required  hospitalization: Yes Has patient had a PCN reaction occurring within the last 10 years: No If all of the above answers are "NO", then may proceed with Cephalosporin use.   . Sulfa Antibiotics Shortness Of Breath    Severe breathing difficulty And redness of skin  . Sulfasalazine Shortness Of Breath    Severe breathing difficulty And redness of skin  . Tagamet [Cimetidine] Nausea And Vomiting  . Toradol [Ketorolac Tromethamine] Shortness Of Breath    Redness to skin  . Vancomycin Rash    Patient Confirms Red mans syndome type rxn - Tolerated a slow infusion in past  . Zoloft [Sertraline Hcl] Shortness Of Breath    Breathing difficulty Redness to skin  . Colcrys [Colchicine] Nausea And Vomiting  . Iodine Swelling    Redness to skin  . Lipitor [Atorvastatin] Diarrhea  . Ranexa [Ranolazine] Other (See Comments)    "Made symptoms (heart racing and pain) worse"    Social History: The patient  reports that she quit smoking about 7 years ago. She has never used smokeless tobacco. She reports that she does not drink alcohol or use drugs.   Family History: The patient's She was adopted. Family history is unknown by patient.   Review of Systems: Please see the history of present illness.   Otherwise, the review of systems is positive for none.   All other systems are reviewed and negative.   Physical Exam: VS:  BP 120/70 (BP Location: Left Arm, Patient Position: Sitting, Cuff Size: Large)   Pulse 96   Ht '5\' 9"'$  (1.753 m)   Wt 276 lb 12.8 oz (125.6 kg)   LMP  (LMP Unknown) Comment: partial hysterectomy  BMI 40.88 kg/m  .  BMI Body mass index is 40.88 kg/m.  Wt Readings from Last 3 Encounters:  08/16/17 276 lb 12.8 oz (125.6 kg)  06/07/17 289 lb 3.2 oz (131.2 kg)  05/18/17 286 lb 9.6 oz (130 kg)    General: She looks older than her stated age. Alert and in no acute distress.   HEENT: Normal.  Neck: Supple, no JVD, carotid bruits, or masses noted.  Cardiac: Regular rate and  rhythm. No murmurs, rubs, or gallops. Legs are quite full -  several ulcerations noted.  Respiratory:  Lungs are clear to auscultation bilaterally with normal work of breathing.  GI: Soft and nontender.  MS: No deformity or atrophy. Gait and ROM intact.  Skin: Warm and dry. Color is normal.  Neuro:  Strength and sensation are intact and no gross focal deficits noted.  Psych: Alert, appropriate and with normal affect.   LABORATORY DATA:  EKG:  EKG is ordered today. This demonstrates NSR.   Lab Results  Component Value Date   WBC 11.4 (H) 04/19/2017   HGB 16.7 (H) 04/19/2017   HCT 47.1 (H) 04/19/2017   PLT 239 04/19/2017   GLUCOSE 319 (H) 04/19/2017   CHOL 125 11/07/2016   TRIG 206 (H) 11/07/2016   HDL 37 (L) 11/07/2016   LDLCALC 47 11/07/2016   ALT 42 (H) 11/07/2016   AST 20 11/07/2016   NA 130 (L) 04/19/2017   K 4.4 04/19/2017   CL 97 (L) 04/19/2017   CREATININE 0.67 04/19/2017   BUN 12 04/19/2017   CO2 24 04/19/2017   TSH 0.504 08/15/2016   INR 1.07 04/19/2017   HGBA1C 10.8 (H) 08/15/2016     BNP (last 3 results) No results for input(s): BNP in the last 8760 hours.  ProBNP (last 3 results) No results for input(s): PROBNP in the last 8760 hours.   Other Studies Reviewed Today:  Procedures   LEFT HEART CATH AND CORONARY ANGIOGRAPHY 04/19/2017  Conclusion   1.  Total occlusion of the mid RCA proximal to the stented segment, with left-to-right collaterals identified 2.  Patency of the mid LAD stent with mild to moderate diffuse LAD stenosis especially involving the distal vessel 3.  Patent left circumflex/intermediate without significant stenosis 4.  Mild to moderate segmental LV systolic dysfunction with LVEF estimated at 45-50% and moderately elevated LVEDP  Recommendation: Options include ongoing medical therapy versus CTO-PCI.  Will arrange a follow-up visit with Dr. Tamala Julian so she can review options further   Echo Study Conclusions 08/2016  - Left ventricle:  Mild distal septal and inferior wall hypokinesis.   The cavity size was normal. Wall thickness was increased in a   pattern of mild LVH. Systolic function was normal. The estimated   ejection fraction was in the range of 50% to 55%. Wall motion was   normal; there were no regional wall motion abnormalities. - Atrial septum: No defect or patent foramen ovale was identified. - Impressions: GLS -14.9 lower limit normal 15.  Impressions:  - GLS -14.9 lower limit normal 15.    Assessment/Plan:  1. Class III/IV angina - in the setting of chronically occluded, previously stented RCA.  RCA is collateralized.  Diffuse disease in the LAD. We will increase the Coreg to 25 mg BID. Cutting Lisinopril back to 10 mg to avoid excessive lowering of her BP. Referring to Dr. Irish Lack to consider CTO. Discussed with Dr. Tamala Julian here in the office who is in agreement.   2. HTN - see above  3. DM - she says her sugars are getting better with getting off Prednisone. A1C in March is down to 9.9.  4. Autoimmune disease  5. Obesity - she has lost 10 # since last visit.   Current medicines are reviewed with the patient today.  The patient does not have concerns regarding medicines other than what has been noted above.  The following changes have been made:  See above.  Labs/ tests ordered today include:    Orders Placed This Encounter  Procedures  .  EKG 12-Lead     Disposition:   FU with Dr. Irish Lack to discuss CTO PCI.   Patient is agreeable to this plan and will call if any problems develop in the interim.   SignedTruitt Merle, NP  08/16/2017 4:08 PM  [facility] [address] [place], [place]  [address] Phone: [phone] Fax: [phone]

## 2017-08-21 DIAGNOSIS — G894 Chronic pain syndrome: Secondary | ICD-10-CM | POA: Diagnosis not present

## 2017-08-21 DIAGNOSIS — Z5181 Encounter for therapeutic drug level monitoring: Secondary | ICD-10-CM | POA: Diagnosis not present

## 2017-08-21 DIAGNOSIS — M5136 Other intervertebral disc degeneration, lumbar region: Secondary | ICD-10-CM | POA: Diagnosis not present

## 2017-08-21 DIAGNOSIS — M5030 Other cervical disc degeneration, unspecified cervical region: Secondary | ICD-10-CM | POA: Diagnosis not present

## 2017-09-07 DIAGNOSIS — L88 Pyoderma gangrenosum: Secondary | ICD-10-CM | POA: Diagnosis not present

## 2017-09-20 DIAGNOSIS — M5136 Other intervertebral disc degeneration, lumbar region: Secondary | ICD-10-CM | POA: Diagnosis not present

## 2017-09-20 DIAGNOSIS — M5030 Other cervical disc degeneration, unspecified cervical region: Secondary | ICD-10-CM | POA: Diagnosis not present

## 2017-09-20 DIAGNOSIS — G894 Chronic pain syndrome: Secondary | ICD-10-CM | POA: Diagnosis not present

## 2017-09-20 DIAGNOSIS — Z5181 Encounter for therapeutic drug level monitoring: Secondary | ICD-10-CM | POA: Diagnosis not present

## 2017-09-26 ENCOUNTER — Ambulatory Visit (INDEPENDENT_AMBULATORY_CARE_PROVIDER_SITE_OTHER): Payer: Medicare Other | Admitting: Interventional Cardiology

## 2017-09-26 ENCOUNTER — Encounter: Payer: Self-pay | Admitting: Interventional Cardiology

## 2017-09-26 ENCOUNTER — Encounter (INDEPENDENT_AMBULATORY_CARE_PROVIDER_SITE_OTHER): Payer: Self-pay

## 2017-09-26 VITALS — BP 126/86 | HR 101 | Ht 69.0 in | Wt 276.0 lb

## 2017-09-26 DIAGNOSIS — I10 Essential (primary) hypertension: Secondary | ICD-10-CM

## 2017-09-26 DIAGNOSIS — I25119 Atherosclerotic heart disease of native coronary artery with unspecified angina pectoris: Secondary | ICD-10-CM

## 2017-09-26 DIAGNOSIS — E1169 Type 2 diabetes mellitus with other specified complication: Secondary | ICD-10-CM | POA: Diagnosis not present

## 2017-09-26 DIAGNOSIS — E669 Obesity, unspecified: Secondary | ICD-10-CM

## 2017-09-26 NOTE — H&P (View-Only) (Signed)
Cardiology Office Note   Date:  09/26/2017   ID:  Brittney Banks, DOB 12/05/1970, MRN [id number]  PCP:  Ronita Hipps, MD    No chief complaint on file.  CAD  Wt Readings from Last 3 Encounters:  09/26/17 276 lb (125.2 kg)  08/16/17 276 lb 12.8 oz (125.6 kg)  06/07/17 289 lb 3.2 oz (131.2 kg)       History of Present Illness: Brittney Banks is a 47 y.o. female  With a h/o CAD who has a CTO of the RCA.  She has brisk left to right collaterals.  She feels that she can have CP at any time.  Sometimes it can happen at rest and sometimes it can happen with activity.  She uses NTG and this helps.     Imdur has ben increased and Coreg increased as well without relief of sx.  She tried Ranexa in the past.    She takes narcotics for chronic pain.    Past Medical History:  Diagnosis Date  . Allergy   . Behcet's disease [facility])    patient advised RN she has Behcet's disease  . Cellulitis 03/2016  . Coronary artery disease    11/09/16 PCI with DES to mLAD, patent RCA stent  . DDD (degenerative disc disease), lumbar   . Diabetes mellitus without complication ([place])    type 2  . Hyperlipidemia   . Hypertension   . LV dysfunction   . MRSA carrier 2008  . MRSA infection 2008   BOTH LEGS  . Myocardial infarction involving right coronary artery [facility])     Past Surgical History:  Procedure Laterality Date  . BILATERAL CARPAL TUNNEL RELEASE  2007  . CORONARY STENT INTERVENTION N/A 08/15/2016   Procedure: Coronary Stent Intervention;  Surgeon: Belva Crome, MD;  Location: [place] [facility];  Service: Cardiovascular;  Laterality: N/A;  . CORONARY STENT INTERVENTION N/A 11/09/2016   Procedure: Coronary Stent Intervention;  Surgeon: Belva Crome, MD;  Location: [place] [facility];  Service: Cardiovascular;  Laterality: N/A;  . ECTOPIC PREGNANCY SURGERY     2 SURGERIES  . LEFT HEART CATH AND CORONARY ANGIOGRAPHY N/A 08/15/2016   Procedure: Left Heart Cath and Coronary Angiography;   Surgeon: Belva Crome, MD;  Location: [place] [facility];  Service: Cardiovascular;  Laterality: N/A;  . LEFT HEART CATH AND CORONARY ANGIOGRAPHY N/A 11/09/2016   Procedure: Left Heart Cath and Coronary Angiography;  Surgeon: Belva Crome, MD;  Location: [place] [facility];  Service: Cardiovascular;  Laterality: N/A;  . LEFT HEART CATH AND CORONARY ANGIOGRAPHY N/A 04/19/2017   Procedure: LEFT HEART CATH AND CORONARY ANGIOGRAPHY;  Surgeon: Sherren Mocha, MD;  Location: [place] [facility];  Service: Cardiovascular;  Laterality: N/A;  . NECK SURGERY N/A 2008  . REPLACEMENT TOTAL KNEE BILATERAL Right   . TONSILLECTOMY    . ULTRASOUND GUIDANCE FOR VASCULAR ACCESS  11/09/2016   Procedure: Ultrasound Guidance For Vascular Access;  Surgeon: Belva Crome, MD;  Location: [place] [facility];  Service: Cardiovascular;;     Current Outpatient Medications  Medication Sig Dispense Refill  . benzonatate (TESSALON) 200 MG capsule Take 1 Capsule 3 times per day for cough  3  . carvedilol (COREG) 25 MG tablet Take 1 tablet (25 mg total) by mouth 2 (two) times daily. 180 tablet 3  . cetirizine (ZYRTEC) 10 MG tablet Take 10 mg by mouth daily.    . clobetasol ointment (  TEMOVATE) 1.61 % Apply 1 application topically daily.  5  . clopidogrel (PLAVIX) 75 MG tablet Take 1 tablet (75 mg total) by mouth daily. 30 tablet 6  . glucose monitoring kit (FREESTYLE) monitoring kit 1 each by Does not apply route as needed for other. Dispense any model that is covered- dispense testing supplies for Q AC/ HS accuchecks- 1 month supply with one refil. 1 each 1  . insulin aspart (NOVOLOG FLEXPEN) 100 UNIT/ML FlexPen Inject 3-20 Units into the skin 3 (three) times daily with meals. CBG 70 - 120: 0 units  CBG 121 - 150: 3 units  CBG 151 - 200: 4 units  CBG 201 - 250: 7 units  CBG 251 - 300: 11 units  CBG 301 - 350: 15 units  CBG 351 - 400: 20 units (Patient taking differently: Inject 3-11 Units into the skin 3 (three) times  daily with meals. ) 15 mL 11  . insulin detemir (LEVEMIR) 100 UNIT/ML injection Inject 0.2 mLs (20 Units total) into the skin at bedtime. 10 mL 11  . isosorbide mononitrate (IMDUR) 60 MG 24 hr tablet Take 2 tablets (120 mg total) by mouth daily. 180 tablet 3  . lisinopril (PRINIVIL,ZESTRIL) 20 MG tablet Take 10 mg by mouth daily.    . metFORMIN (GLUCOPHAGE) 850 MG tablet Take 850 mg by mouth 3 (three) times daily with meals.    Marland Kitchen morphine (MS CONTIN) 60 MG 12 hr tablet Take 60 mg by mouth every 8 (eight) hours as needed for pain.    Marland Kitchen NARCAN 4 MG/0.1ML LIQD nasal spray kit Call EMS at first sign of opioid overdose. Inject spray into nostril. If no response in 2 minutes, inject opposite nostril.  0  . neomycin-bacitracin-polymyxin (NEOSPORIN) ointment Apply 1 application topically as needed for wound care.    . nitroGLYCERIN (NITROSTAT) 0.4 MG SL tablet Place 1 tablet (0.4 mg total) under the tongue every 5 (five) minutes x 3 doses as needed for chest pain. 25 tablet 3  . omeprazole (PRILOSEC) 20 MG capsule Take 20 mg by mouth daily.  3  . Oxycodone HCl 20 MG TABS Take 20 mg by mouth every 4 (four) hours as needed (pain).    Marland Kitchen oxymetazoline (AFRIN) 0.05 % nasal spray Place 1 spray into both nostrils daily as needed for congestion.    . rosuvastatin (CRESTOR) 20 MG tablet Take 20 mg by mouth daily.     No current facility-administered medications for this visit.     Allergies:   Anaprox [naproxen sodium]; Aspirin; Brilinta [ticagrelor]; Ceftin [cefuroxime axetil]; Celexa [citalopram hydrobromide]; Ciprofloxacin; Coconut fragrance; Contrast media [iodinated diagnostic agents]; Cortisol [hydrocortisone]; Elavil [amitriptyline]; Hctz [hydrochlorothiazide]; Imitrex [sumatriptan]; Lexapro [escitalopram oxalate]; Methadone; Metrizamide; Motrin [ibuprofen]; Naprosyn [naproxen]; Neurontin [gabapentin]; Penicillins; Sulfa antibiotics; Sulfasalazine; Tagamet [cimetidine]; Toradol [ketorolac tromethamine];  Vancomycin; Zoloft [sertraline hcl]; Colcrys [colchicine]; Iodine; Lipitor [atorvastatin]; and Ranexa [ranolazine]    Social History:  The patient  reports that she quit smoking about 7 years ago. She has never used smokeless tobacco. She reports that she does not drink alcohol or use drugs.   Family History:  The patient's She was adopted. Family history is unknown by patient.    ROS:  Please see the history of present illness.   Otherwise, review of systems are positive for chest pain.   All other systems are reviewed and negative.    PHYSICAL EXAM: VS:  BP 126/86   Pulse (!) 101   Ht '5\' 9"'$  (1.753 m)   Wt  276 lb (125.2 kg)   LMP  (LMP Unknown) Comment: partial hysterectomy  SpO2 98%   BMI 40.76 kg/m  , BMI Body mass index is 40.76 kg/m. GEN: Well nourished, well developed, in no acute distress  HEENT: normal  Neck: no JVD, carotid bruits, or masses Cardiac: RRR; no murmurs, rubs, or gallops,no edema  Respiratory:  clear to auscultation bilaterally, normal work of breathing GI: soft, nontender, nondistended, + BS, obese MS: no deformity or atrophy  Skin: warm and dry, rash on both legs Neuro:  Strength and sensation are intact Psych: euthymic mood, full affect    Recent Labs: 11/07/2016: ALT 42 04/19/2017: BUN 12; Creatinine, Ser 0.67; Hemoglobin 16.7; Platelets 239; Potassium 4.4; Sodium 130   Lipid Panel    Component Value Date/Time   CHOL 125 11/07/2016 1124   TRIG 206 (H) 11/07/2016 1124   HDL 37 (L) 11/07/2016 1124   CHOLHDL 3.4 11/07/2016 1124   CHOLHDL 6.9 08/16/2016 0342   VLDL 24 08/16/2016 0342   LDLCALC 47 11/07/2016 1124     Other studies Reviewed: Additional studies/ records that were reviewed today with results demonstrating: I personally reviewed her cath films from 2019 and 2018.    ASSESSMENT AND PLAN:  1. CAD: She is still having angina, although the symptoms seem out of proportion to her CAD.  SHe has brisk collaterals and I would not think she  would have rest pain with those collaterals present.  I personally reviewed her films and I think CTO PCI of the RCA would be reasonable given the anatomy of the vessel.    2. Scheduling is an issue.  Will try to find a date when it is convenient to get the procedure done.  Next open spot is August 28th, but will try to schedule before that time.  3. DM: Continue aggressive DM management.  4. HTN: The current medical regimen is effective;  continue present plan and medications. 5. Morbid obesity. Healthy diet and exercise.   Current medicines are reviewed at length with the patient today.  The patient concerns regarding her medicines were addressed.  The following changes have been made:  No change  Labs/ tests ordered today include:  No orders of the defined types were placed in this encounter.   Recommend 150 minutes/week of aerobic exercise Low fat, low carb, high fiber diet recommended  Disposition:   FU based on cath scheduling   Signed, Larae Grooms, MD  09/26/2017 4:10 PM    [facility] [place], [place], [place]  [address] Phone: [phone]; Fax: [phone]

## 2017-09-26 NOTE — Patient Instructions (Signed)
Medication Instructions:  Your physician recommends that you continue on your current medications as directed. Please refer to the Current Medication list given to you today.   Labwork: None ordered  Testing/Procedures: None ordered  Follow-Up: To be determined  Any Other Special Instructions Will Be Listed Below (If Applicable).  ---WE WILL CALL YOU TO SCHEDULE YOUR CTO INTERVENTION PROCEDURE---   If you need a refill on your cardiac medications before your next appointment, please call your pharmacy.

## 2017-09-26 NOTE — Progress Notes (Signed)
Cardiology Office Note   Date:  09/26/2017   ID:  Brittney Banks, DOB [birth year], MRN [id number]  PCP:  Brittney Hipps, MD    No chief complaint on file.  CAD  Wt Readings from Last 3 Encounters:  09/26/17 276 lb (125.2 kg)  08/16/17 276 lb 12.8 oz (125.6 kg)  06/07/17 289 lb 3.2 oz (131.2 kg)       History of Present Illness: Brittney Banks is a 47 y.o. female  With a h/o CAD who has a CTO of the RCA.  She has brisk left to right collaterals.  She feels that she can have CP at any time.  Sometimes it can happen at rest and sometimes it can happen with activity.  She uses NTG and this helps.     Imdur has ben increased and Coreg increased as well without relief of sx.  She tried Ranexa in the past.    She takes narcotics for chronic pain.    Past Medical History:  Diagnosis Date  . Allergy   . Behcet's disease [facility])    patient advised RN she has Behcet's disease  . Cellulitis 03/2016  . Coronary artery disease    11/09/16 PCI with DES to mLAD, patent RCA stent  . DDD (degenerative disc disease), lumbar   . Diabetes mellitus without complication ([place])    type 2  . Hyperlipidemia   . Hypertension   . LV dysfunction   . MRSA carrier 2008  . MRSA infection 2008   BOTH LEGS  . Myocardial infarction involving right coronary artery [facility])     Past Surgical History:  Procedure Laterality Date  . BILATERAL CARPAL TUNNEL RELEASE  2007  . CORONARY STENT INTERVENTION N/A 08/15/2016   Procedure: Coronary Stent Intervention;  Surgeon: Belva Crome, MD;  Location: [place] CV LAB;  Service: Cardiovascular;  Laterality: N/A;  . CORONARY STENT INTERVENTION N/A 11/09/2016   Procedure: Coronary Stent Intervention;  Surgeon: Belva Crome, MD;  Location: [place] CV LAB;  Service: Cardiovascular;  Laterality: N/A;  . ECTOPIC PREGNANCY SURGERY     2 SURGERIES  . LEFT HEART CATH AND CORONARY ANGIOGRAPHY N/A 08/15/2016   Procedure: Left Heart Cath and Coronary Angiography;   Surgeon: Belva Crome, MD;  Location: [place] CV LAB;  Service: Cardiovascular;  Laterality: N/A;  . LEFT HEART CATH AND CORONARY ANGIOGRAPHY N/A 11/09/2016   Procedure: Left Heart Cath and Coronary Angiography;  Surgeon: Belva Crome, MD;  Location: [place] CV LAB;  Service: Cardiovascular;  Laterality: N/A;  . LEFT HEART CATH AND CORONARY ANGIOGRAPHY N/A 04/19/2017   Procedure: LEFT HEART CATH AND CORONARY ANGIOGRAPHY;  Surgeon: Sherren Mocha, MD;  Location: [place] CV LAB;  Service: Cardiovascular;  Laterality: N/A;  . NECK SURGERY N/A 2008  . REPLACEMENT TOTAL KNEE BILATERAL Right   . TONSILLECTOMY    . ULTRASOUND GUIDANCE FOR VASCULAR ACCESS  11/09/2016   Procedure: Ultrasound Guidance For Vascular Access;  Surgeon: Belva Crome, MD;  Location: [place] CV LAB;  Service: Cardiovascular;;     Current Outpatient Medications  Medication Sig Dispense Refill  . benzonatate (TESSALON) 200 MG capsule Take 1 Capsule 3 times per day for cough  3  . carvedilol (COREG) 25 MG tablet Take 1 tablet (25 mg total) by mouth 2 (two) times daily. 180 tablet 3  . cetirizine (ZYRTEC) 10 MG tablet Take 10 mg by mouth daily.    . clobetasol ointment (  TEMOVATE) 9.76 % Apply 1 application topically daily.  5  . clopidogrel (PLAVIX) 75 MG tablet Take 1 tablet (75 mg total) by mouth daily. 30 tablet 6  . glucose monitoring kit (FREESTYLE) monitoring kit 1 each by Does not apply route as needed for other. Dispense any model that is covered- dispense testing supplies for Q AC/ HS accuchecks- 1 month supply with one refil. 1 each 1  . insulin aspart (NOVOLOG FLEXPEN) 100 UNIT/ML FlexPen Inject 3-20 Units into the skin 3 (three) times daily with meals. CBG 70 - 120: 0 units  CBG 121 - 150: 3 units  CBG 151 - 200: 4 units  CBG 201 - 250: 7 units  CBG 251 - 300: 11 units  CBG 301 - 350: 15 units  CBG 351 - 400: 20 units (Patient taking differently: Inject 3-11 Units into the skin 3 (three) times  daily with meals. ) 15 mL 11  . insulin detemir (LEVEMIR) 100 UNIT/ML injection Inject 0.2 mLs (20 Units total) into the skin at bedtime. 10 mL 11  . isosorbide mononitrate (IMDUR) 60 MG 24 hr tablet Take 2 tablets (120 mg total) by mouth daily. 180 tablet 3  . lisinopril (PRINIVIL,ZESTRIL) 20 MG tablet Take 10 mg by mouth daily.    . metFORMIN (GLUCOPHAGE) 850 MG tablet Take 850 mg by mouth 3 (three) times daily with meals.    Marland Kitchen morphine (MS CONTIN) 60 MG 12 hr tablet Take 60 mg by mouth every 8 (eight) hours as needed for pain.    Marland Kitchen NARCAN 4 MG/0.1ML LIQD nasal spray kit Call EMS at first sign of opioid overdose. Inject spray into nostril. If no response in 2 minutes, inject opposite nostril.  0  . neomycin-bacitracin-polymyxin (NEOSPORIN) ointment Apply 1 application topically as needed for wound care.    . nitroGLYCERIN (NITROSTAT) 0.4 MG SL tablet Place 1 tablet (0.4 mg total) under the tongue every 5 (five) minutes x 3 doses as needed for chest pain. 25 tablet 3  . omeprazole (PRILOSEC) 20 MG capsule Take 20 mg by mouth daily.  3  . Oxycodone HCl 20 MG TABS Take 20 mg by mouth every 4 (four) hours as needed (pain).    Marland Kitchen oxymetazoline (AFRIN) 0.05 % nasal spray Place 1 spray into both nostrils daily as needed for congestion.    . rosuvastatin (CRESTOR) 20 MG tablet Take 20 mg by mouth daily.     No current facility-administered medications for this visit.     Allergies:   Anaprox [naproxen sodium]; Aspirin; Brilinta [ticagrelor]; Ceftin [cefuroxime axetil]; Celexa [citalopram hydrobromide]; Ciprofloxacin; Coconut fragrance; Contrast media [iodinated diagnostic agents]; Cortisol [hydrocortisone]; Elavil [amitriptyline]; Hctz [hydrochlorothiazide]; Imitrex [sumatriptan]; Lexapro [escitalopram oxalate]; Methadone; Metrizamide; Motrin [ibuprofen]; Naprosyn [naproxen]; Neurontin [gabapentin]; Penicillins; Sulfa antibiotics; Sulfasalazine; Tagamet [cimetidine]; Toradol [ketorolac tromethamine];  Vancomycin; Zoloft [sertraline hcl]; Colcrys [colchicine]; Iodine; Lipitor [atorvastatin]; and Ranexa [ranolazine]    Social History:  The patient  reports that she quit smoking about 7 years ago. She has never used smokeless tobacco. She reports that she does not drink alcohol or use drugs.   Family History:  The patient's She was adopted. Family history is unknown by patient.    ROS:  Please see the history of present illness.   Otherwise, review of systems are positive for chest pain.   All other systems are reviewed and negative.    PHYSICAL EXAM: VS:  BP 126/86   Pulse (!) 101   Ht '5\' 9"'$  (1.753 m)   Wt  276 lb (125.2 kg)   LMP  (LMP Unknown) Comment: partial hysterectomy  SpO2 98%   BMI 40.76 kg/m  , BMI Body mass index is 40.76 kg/m. GEN: Well nourished, well developed, in no acute distress  HEENT: normal  Neck: no JVD, carotid bruits, or masses Cardiac: RRR; no murmurs, rubs, or gallops,no edema  Respiratory:  clear to auscultation bilaterally, normal work of breathing GI: soft, nontender, nondistended, + BS, obese MS: no deformity or atrophy  Skin: warm and dry, rash on both legs Neuro:  Strength and sensation are intact Psych: euthymic mood, full affect    Recent Labs: 11/07/2016: ALT 42 04/19/2017: BUN 12; Creatinine, Ser 0.67; Hemoglobin 16.7; Platelets 239; Potassium 4.4; Sodium 130   Lipid Panel    Component Value Date/Time   CHOL 125 11/07/2016 1124   TRIG 206 (H) 11/07/2016 1124   HDL 37 (L) 11/07/2016 1124   CHOLHDL 3.4 11/07/2016 1124   CHOLHDL 6.9 08/16/2016 0342   VLDL 24 08/16/2016 0342   LDLCALC 47 11/07/2016 1124     Other studies Reviewed: Additional studies/ records that were reviewed today with results demonstrating: I personally reviewed her cath films from 2019 and 2018.    ASSESSMENT AND PLAN:  1. CAD: She is still having angina, although the symptoms seem out of proportion to her CAD.  SHe has brisk collaterals and I would not think she  would have rest pain with those collaterals present.  I personally reviewed her films and I think CTO PCI of the RCA would be reasonable given the anatomy of the vessel.    2. Scheduling is an issue.  Will try to find a date when it is convenient to get the procedure done.  Next open spot is August 28th, but will try to schedule before that time.  3. DM: Continue aggressive DM management.  4. HTN: The current medical regimen is effective;  continue present plan and medications. 5. Morbid obesity. Healthy diet and exercise.   Current medicines are reviewed at length with the patient today.  The patient concerns regarding her medicines were addressed.  The following changes have been made:  No change  Labs/ tests ordered today include:  No orders of the defined types were placed in this encounter.   Recommend 150 minutes/week of aerobic exercise Low fat, low carb, high fiber diet recommended  Disposition:   FU based on cath scheduling   Signed, Larae Grooms, MD  09/26/2017 4:10 PM    [facility] [place], [place], [place]  [address] Phone: [phone]; Fax: [phone]

## 2017-09-29 ENCOUNTER — Telehealth: Payer: Self-pay

## 2017-09-29 DIAGNOSIS — Z01812 Encounter for preprocedural laboratory examination: Secondary | ICD-10-CM

## 2017-09-29 DIAGNOSIS — I25119 Atherosclerotic heart disease of native coronary artery with unspecified angina pectoris: Secondary | ICD-10-CM

## 2017-09-29 MED ORDER — DEXAMETHASONE 6 MG PO TABS: ORAL_TABLET | ORAL | 0 refills | Status: DC

## 2017-09-29 NOTE — Telephone Encounter (Signed)
Called and made patient aware of pre-procedure instructions below. Patient has lab appointment on 10/02/17. She will pick up her instruction letter at that time. Follow-up appointment with Dr. Eldridge Dace on 10/17/17 at 3:40 PM. Patient with multiple allergies. Per Dr. Eldridge Dace, patient to take ASA 81 mg the morning of procedure. Patient with contrast allergy and allergy to hydrocortisone. Discussed with Tresa Endo [facility] and per [facility] patient to take dexamethasone 6 mg PO 13 hrs, 7 hrs, 1 hour prior to procedure along with benadryl. Rx sent to preferred pharmacy.     [facility] CARDIOVASCULAR DIVISION [facility] OFFICE [address] [place] [address] Dept: [phone] Loc: [phone]  NOTIE WILDT  09/29/2017  You are scheduled for a Coronary Chronic Total Occlusion Intervention on Thursday, June 20 with Dr. Lance Muss.  1. Please arrive at the [facility] (Main Entrance A) at [facility]: [address] [place], [place] [address] at 10:00 AM (two hours before your procedure to ensure your preparation). Free valet parking service is available.   Special note: Every effort is made to have your procedure done on time. Please understand that emergencies sometimes delay scheduled procedures.  2. Diet: Do not eat or drink anything after midnight prior to your procedure except sips of water to take medications.  3. Labs: You will need to have blood drawn on Monday, June 17 at [facility] at [facility]. [address], [place]  Open: 7:30am - 5pm    Phone: [phone]. You do not need to be fasting.  4. Medication instructions in preparation for your procedure:   1. Only take 1/2 dose of your insulin detemir (levemir) the night before your procedure    2. DO NOT take your insulin aspart (novolog) the morning of your procedure   3. DO NOT take your metformin (glucophage) the morning of the procedure and hold for  48 hours after the procedure       On the morning of your procedure, take a baby Aspirin 81 mg and your clopidogrel (plavix) and any morning medicines NOT listed above.  You may use sips of water.  For your contrast allergy you will need to take:   - Take dexamethasone 6 mg (1 tablet): 13 hours prior to your procedure    - Take dexamethasone 6 mg (1 tablet): 7 hours prior to your procedure    - Take dexamethasone 6 mg (1 tablet): Take just prior to your arrival to the hospital    - Take Benadryl 50 mg with your last dose of dexamethasone just prior to your arrival to the hospital  5. Plan for one night stay--bring personal belongings. 6. Bring a current list of your medications and current insurance cards. 7. You MUST have a responsible person to drive you home. 8. Someone MUST be with you the first 24 hours after you arrive home or your discharge will be delayed. 9. Please wear clothes that are easy to get on and off and wear slip-on shoes.  Thank you for allowing [place] to care for you!   --  Invasive Cardiovascular services

## 2017-10-02 ENCOUNTER — Other Ambulatory Visit: Payer: Medicare Other | Admitting: *Deleted

## 2017-10-02 DIAGNOSIS — I25119 Atherosclerotic heart disease of native coronary artery with unspecified angina pectoris: Secondary | ICD-10-CM

## 2017-10-02 DIAGNOSIS — Z01812 Encounter for preprocedural laboratory examination: Secondary | ICD-10-CM | POA: Diagnosis not present

## 2017-10-03 LAB — CBC
Hematocrit: 47.3 % — ABNORMAL HIGH (ref 34.0–46.6)
Hemoglobin: 16 g/dL — ABNORMAL HIGH (ref 11.1–15.9)
MCH: 31 pg (ref 26.6–33.0)
MCHC: 33.8 g/dL (ref 31.5–35.7)
MCV: 92 fL (ref 79–97)
PLATELETS: 174 10*3/uL (ref 150–450)
RBC: 5.16 x10E6/uL (ref 3.77–5.28)
RDW: 13.2 % (ref 12.3–15.4)
WBC: 9.4 10*3/uL (ref 3.4–10.8)

## 2017-10-03 LAB — BASIC METABOLIC PANEL
BUN/Creatinine Ratio: 9 (ref 9–23)
BUN: 6 mg/dL (ref 6–24)
CHLORIDE: 99 mmol/L (ref 96–106)
CO2: 24 mmol/L (ref 20–29)
Calcium: 9.2 mg/dL (ref 8.7–10.2)
Creatinine, Ser: 0.7 mg/dL (ref 0.57–1.00)
GFR calc non Af Amer: 104 mL/min/{1.73_m2} (ref >59)
GFR, EST AFRICAN AMERICAN: 120 mL/min/{1.73_m2} (ref >59)
GLUCOSE: 399 mg/dL — AB (ref 65–99)
POTASSIUM: 4.6 mmol/L (ref 3.5–5.2)
Sodium: 137 mmol/L (ref 134–144)

## 2017-10-04 ENCOUNTER — Telehealth: Payer: Self-pay | Admitting: *Deleted

## 2017-10-04 NOTE — Telephone Encounter (Signed)
Pt contacted pre-CTO scheduled at [facility] for: Thursday October 05, 2017 1:30 PM Verified arrival time and place: [facility] Main Entrance A at: 10 AM per Dr Eldridge Dace -may be able to do earlier.  No solid food after midnight prior to cath, clear liquids until 5 AM day of procedure.   Hold: Metformin 10/05/17 and 48 hours post procedure. Insulin AM of procedure 1/2 Insulin PM prior to procedure.  Except hold medications AM meds can be  taken pre-cath with sip of water including: ASA 81 mg -pt does have aspirin allergy listed -Dr Eldridge Dace does want her to take ASA 81 mg the morning of procedure. Clopidogrel 75 mg  Confirmed patient has responsible person to drive home post procedure and observe patient for 24 hours: yes   Patient  has allergy to contrast and hydrocortisone. Per  pharmacy - patient is to take dexamethasone 6 mg in place of prednisone (13, 7, 1 hour prior to procedure) along with the benadryl 50 mg 1 hour prior to procedure.  I reviewed instructions with patient, she verbalized understanding, thanked me for call.

## 2017-10-05 ENCOUNTER — Ambulatory Visit (HOSPITAL_COMMUNITY)
Admission: RE | Admit: 2017-10-05 | Discharge: 2017-10-06 | Disposition: A | Discharge status: Home / Self Care | Payer: Medicare Other | Source: Ambulatory Visit | Attending: Interventional Cardiology | Admitting: Interventional Cardiology

## 2017-10-05 ENCOUNTER — Ambulatory Visit (HOSPITAL_BASED_OUTPATIENT_CLINIC_OR_DEPARTMENT_OTHER): Payer: Medicare Other

## 2017-10-05 ENCOUNTER — Encounter (HOSPITAL_COMMUNITY): Payer: Self-pay | Admitting: General Practice

## 2017-10-05 ENCOUNTER — Ambulatory Visit (HOSPITAL_COMMUNITY)
Admission: RE | Disposition: A | Discharge status: Home / Self Care | Payer: Self-pay | Source: Ambulatory Visit | Attending: Interventional Cardiology

## 2017-10-05 ENCOUNTER — Other Ambulatory Visit: Payer: Self-pay

## 2017-10-05 DIAGNOSIS — I252 Old myocardial infarction: Secondary | ICD-10-CM | POA: Insufficient documentation

## 2017-10-05 DIAGNOSIS — I209 Angina pectoris, unspecified: Secondary | ICD-10-CM | POA: Diagnosis present

## 2017-10-05 DIAGNOSIS — Z87891 Personal history of nicotine dependence: Secondary | ICD-10-CM | POA: Diagnosis not present

## 2017-10-05 DIAGNOSIS — Z794 Long term (current) use of insulin: Secondary | ICD-10-CM | POA: Diagnosis not present

## 2017-10-05 DIAGNOSIS — I2582 Chronic total occlusion of coronary artery: Secondary | ICD-10-CM | POA: Insufficient documentation

## 2017-10-05 DIAGNOSIS — M5136 Other intervertebral disc degeneration, lumbar region: Secondary | ICD-10-CM | POA: Insufficient documentation

## 2017-10-05 DIAGNOSIS — Z8614 Personal history of Methicillin resistant Staphylococcus aureus infection: Secondary | ICD-10-CM | POA: Insufficient documentation

## 2017-10-05 DIAGNOSIS — I313 Pericardial effusion (noninflammatory): Secondary | ICD-10-CM

## 2017-10-05 DIAGNOSIS — Z7902 Long term (current) use of antithrombotics/antiplatelets: Secondary | ICD-10-CM | POA: Diagnosis not present

## 2017-10-05 DIAGNOSIS — Z880 Allergy status to penicillin: Secondary | ICD-10-CM | POA: Insufficient documentation

## 2017-10-05 DIAGNOSIS — K219 Gastro-esophageal reflux disease without esophagitis: Secondary | ICD-10-CM | POA: Insufficient documentation

## 2017-10-05 DIAGNOSIS — Z955 Presence of coronary angioplasty implant and graft: Secondary | ICD-10-CM | POA: Insufficient documentation

## 2017-10-05 DIAGNOSIS — Z6841 Body Mass Index (BMI) 40.0 and over, adult: Secondary | ICD-10-CM | POA: Diagnosis not present

## 2017-10-05 DIAGNOSIS — E1165 Type 2 diabetes mellitus with hyperglycemia: Secondary | ICD-10-CM | POA: Insufficient documentation

## 2017-10-05 DIAGNOSIS — I25118 Atherosclerotic heart disease of native coronary artery with other forms of angina pectoris: Secondary | ICD-10-CM | POA: Diagnosis present

## 2017-10-05 DIAGNOSIS — G894 Chronic pain syndrome: Secondary | ICD-10-CM | POA: Insufficient documentation

## 2017-10-05 DIAGNOSIS — E1169 Type 2 diabetes mellitus with other specified complication: Secondary | ICD-10-CM | POA: Diagnosis present

## 2017-10-05 DIAGNOSIS — Z91041 Radiographic dye allergy status: Secondary | ICD-10-CM | POA: Diagnosis not present

## 2017-10-05 DIAGNOSIS — I25119 Atherosclerotic heart disease of native coronary artery with unspecified angina pectoris: Secondary | ICD-10-CM | POA: Diagnosis not present

## 2017-10-05 DIAGNOSIS — M352 Behcet's disease: Secondary | ICD-10-CM | POA: Insufficient documentation

## 2017-10-05 DIAGNOSIS — E785 Hyperlipidemia, unspecified: Secondary | ICD-10-CM | POA: Diagnosis not present

## 2017-10-05 DIAGNOSIS — I10 Essential (primary) hypertension: Secondary | ICD-10-CM | POA: Insufficient documentation

## 2017-10-05 DIAGNOSIS — E669 Obesity, unspecified: Secondary | ICD-10-CM

## 2017-10-05 DIAGNOSIS — Z882 Allergy status to sulfonamides status: Secondary | ICD-10-CM | POA: Diagnosis not present

## 2017-10-05 HISTORY — PX: CORONARY CTO INTERVENTION: CATH118236

## 2017-10-05 HISTORY — DX: Other chronic pain: G89.29

## 2017-10-05 HISTORY — DX: Unspecified chronic bronchitis: J42

## 2017-10-05 HISTORY — DX: Gastro-esophageal reflux disease without esophagitis: K21.9

## 2017-10-05 HISTORY — DX: Pyoderma gangrenosum: L88

## 2017-10-05 HISTORY — DX: Acute myocardial infarction, unspecified: I21.9

## 2017-10-05 HISTORY — DX: Migraine, unspecified, not intractable, without status migrainosus: G43.909

## 2017-10-05 HISTORY — DX: Dependence on supplemental oxygen: Z99.81

## 2017-10-05 HISTORY — DX: Unspecified osteoarthritis, unspecified site: M19.90

## 2017-10-05 HISTORY — DX: Pneumonia, unspecified organism: J18.9

## 2017-10-05 HISTORY — DX: Type 2 diabetes mellitus without complications: E11.9

## 2017-10-05 HISTORY — DX: Dorsalgia, unspecified: M54.9

## 2017-10-05 HISTORY — DX: Other seasonal allergic rhinitis: J30.2

## 2017-10-05 LAB — POCT ACTIVATED CLOTTING TIME
ACTIVATED CLOTTING TIME: 428 s
Activated Clotting Time: 301 seconds

## 2017-10-05 LAB — GLUCOSE, CAPILLARY
GLUCOSE-CAPILLARY: 362 mg/dL — AB (ref 65–99)
GLUCOSE-CAPILLARY: 330 mg/dL — AB (ref 65–99)
GLUCOSE-CAPILLARY: 330 mg/dL — AB (ref 65–99)
GLUCOSE-CAPILLARY: 290 mg/dL — AB (ref 65–99)
GLUCOSE-CAPILLARY: 336 mg/dL — AB (ref 65–99)
Glucose-Capillary: 326 mg/dL — ABNORMAL HIGH (ref 65–99)
Glucose-Capillary: 312 mg/dL — ABNORMAL HIGH (ref 65–99)

## 2017-10-05 LAB — ECHOCARDIOGRAM LIMITED
HEIGHTINCHES: 69 in
WEIGHTICAEL: 4224 [oz_av]

## 2017-10-05 SURGERY — CORONARY CTO INTERVENTION
Anesthesia: LOCAL

## 2017-10-05 MED ORDER — SODIUM CHLORIDE 0.9% FLUSH
3.0000 mL | Freq: Two times a day (BID) | INTRAVENOUS | Status: DC
  Administered 2017-10-06: 3 mL via INTRAVENOUS

## 2017-10-05 MED ORDER — ACETAMINOPHEN 325 MG PO TABS: 650.0000 mg | ORAL_TABLET | ORAL | Status: DC | PRN

## 2017-10-05 MED ORDER — INSULIN ASPART 100 UNIT/ML ~~LOC~~ SOLN
5.0000 [IU] | Freq: Once | SUBCUTANEOUS | Status: AC
  Administered 2017-10-05: 5 [IU] via SUBCUTANEOUS
  Filled 2017-10-05: qty 1

## 2017-10-05 MED ORDER — SODIUM CHLORIDE 0.9 % WEIGHT BASED INFUSION
3.0000 mL/kg/h | INTRAVENOUS | Status: DC
  Administered 2017-10-05: 3 mL/kg/h via INTRAVENOUS

## 2017-10-05 MED ORDER — MIDAZOLAM HCL 2 MG/2ML IJ SOLN
INTRAMUSCULAR | Status: AC
  Filled 2017-10-05: qty 2

## 2017-10-05 MED ORDER — LIDOCAINE HCL (PF) 1 % IJ SOLN
INTRAMUSCULAR | Status: AC
  Filled 2017-10-05: qty 30

## 2017-10-05 MED ORDER — LIDOCAINE HCL (PF) 1 % IJ SOLN
INTRAMUSCULAR | Status: DC | PRN
  Administered 2017-10-05: 28 mL
  Administered 2017-10-05: 2 mL
  Administered 2017-10-05: 30 mL

## 2017-10-05 MED ORDER — SODIUM CHLORIDE 0.9 % IV SOLN: INTRAVENOUS | Status: AC

## 2017-10-05 MED ORDER — SODIUM CHLORIDE 0.9 % IV SOLN: 250.0000 mL | INTRAVENOUS | Status: DC | PRN

## 2017-10-05 MED ORDER — SODIUM CHLORIDE 0.9 % WEIGHT BASED INFUSION: 1.0000 mL/kg/h | INTRAVENOUS | Status: DC

## 2017-10-05 MED ORDER — INSULIN ASPART 100 UNIT/ML ~~LOC~~ SOLN
5.0000 [IU] | Freq: Once | SUBCUTANEOUS | Status: AC
  Administered 2017-10-05: 5 [IU] via SUBCUTANEOUS
  Filled 2017-10-05: qty 1
  Filled 2017-10-05: qty 0.05

## 2017-10-05 MED ORDER — IOPAMIDOL (ISOVUE-370) INJECTION 76%
INTRAVENOUS | Status: AC
  Filled 2017-10-05: qty 125

## 2017-10-05 MED ORDER — ONDANSETRON HCL 4 MG/2ML IJ SOLN: 4.0000 mg | Freq: Four times a day (QID) | INTRAMUSCULAR | Status: DC | PRN

## 2017-10-05 MED ORDER — CLOPIDOGREL BISULFATE 75 MG PO TABS
75.0000 mg | ORAL_TABLET | Freq: Every day | ORAL | Status: DC
  Administered 2017-10-05 – 2017-10-06 (×2): 75 mg via ORAL
  Filled 2017-10-05 (×2): qty 1

## 2017-10-05 MED ORDER — ISOSORBIDE MONONITRATE ER 60 MG PO TB24
120.0000 mg | ORAL_TABLET | Freq: Every day | ORAL | Status: DC
  Administered 2017-10-05 – 2017-10-06 (×2): 120 mg via ORAL
  Filled 2017-10-05 (×2): qty 2

## 2017-10-05 MED ORDER — SODIUM CHLORIDE 0.9% FLUSH: 3.0000 mL | Freq: Two times a day (BID) | INTRAVENOUS | Status: DC

## 2017-10-05 MED ORDER — NITROGLYCERIN 0.4 MG SL SUBL: 0.4000 mg | SUBLINGUAL_TABLET | SUBLINGUAL | Status: DC | PRN

## 2017-10-05 MED ORDER — FENTANYL CITRATE (PF) 100 MCG/2ML IJ SOLN
INTRAMUSCULAR | Status: AC
  Filled 2017-10-05: qty 2

## 2017-10-05 MED ORDER — INSULIN DETEMIR 100 UNIT/ML ~~LOC~~ SOLN: 20.0000 [IU] | Freq: Every day | SUBCUTANEOUS | Status: DC

## 2017-10-05 MED ORDER — VERAPAMIL HCL 2.5 MG/ML IV SOLN
INTRAVENOUS | Status: AC
  Filled 2017-10-05: qty 2

## 2017-10-05 MED ORDER — OXYCODONE HCL 5 MG PO TABS
20.0000 mg | ORAL_TABLET | ORAL | Status: DC
  Administered 2017-10-05 – 2017-10-06 (×6): 20 mg via ORAL
  Filled 2017-10-05 (×6): qty 4

## 2017-10-05 MED ORDER — FENTANYL CITRATE (PF) 100 MCG/2ML IJ SOLN
INTRAMUSCULAR | Status: DC | PRN
  Administered 2017-10-05: 50 ug via INTRAVENOUS
  Administered 2017-10-05 (×3): 25 ug via INTRAVENOUS

## 2017-10-05 MED ORDER — NITROGLYCERIN 1 MG/10 ML FOR IR/CATH LAB
INTRA_ARTERIAL | Status: AC
  Filled 2017-10-05: qty 10

## 2017-10-05 MED ORDER — HEPARIN SODIUM (PORCINE) 1000 UNIT/ML IJ SOLN
INTRAMUSCULAR | Status: AC
  Filled 2017-10-05: qty 2

## 2017-10-05 MED ORDER — HEPARIN (PORCINE) IN NACL 2-0.9 UNITS/ML
INTRAMUSCULAR | Status: AC | PRN
  Administered 2017-10-05 (×2): 1000 mL

## 2017-10-05 MED ORDER — MORPHINE SULFATE ER 30 MG PO TBCR
60.0000 mg | EXTENDED_RELEASE_TABLET | Freq: Three times a day (TID) | ORAL | Status: DC
  Administered 2017-10-05 – 2017-10-06 (×3): 60 mg via ORAL
  Filled 2017-10-05 (×3): qty 2

## 2017-10-05 MED ORDER — IOPAMIDOL (ISOVUE-370) INJECTION 76%
INTRAVENOUS | Status: AC
  Filled 2017-10-05: qty 100

## 2017-10-05 MED ORDER — MIDAZOLAM HCL 2 MG/2ML IJ SOLN
INTRAMUSCULAR | Status: DC | PRN
  Administered 2017-10-05: 1 mg via INTRAVENOUS
  Administered 2017-10-05: 2 mg via INTRAVENOUS
  Administered 2017-10-05 (×2): 1 mg via INTRAVENOUS

## 2017-10-05 MED ORDER — CLOBETASOL PROPIONATE 0.05 % EX OINT
1.0000 "application " | TOPICAL_OINTMENT | Freq: Every day | CUTANEOUS | Status: DC
  Filled 2017-10-05: qty 15

## 2017-10-05 MED ORDER — HYDROMORPHONE HCL 1 MG/ML IJ SOLN
INTRAMUSCULAR | Status: DC | PRN
  Administered 2017-10-05: 0.5 mg via INTRAVENOUS

## 2017-10-05 MED ORDER — INSULIN GLARGINE 100 UNIT/ML ~~LOC~~ SOLN
20.0000 [IU] | Freq: Every day | SUBCUTANEOUS | Status: DC
  Administered 2017-10-05: 22:00:00 20 [IU] via SUBCUTANEOUS
  Filled 2017-10-05 (×2): qty 0.2

## 2017-10-05 MED ORDER — FENTANYL CITRATE (PF) 100 MCG/2ML IJ SOLN
INTRAMUSCULAR | Status: AC
Start: 2017-10-05 — End: ?
  Filled 2017-10-05: qty 2

## 2017-10-05 MED ORDER — HYDROMORPHONE HCL 1 MG/ML IJ SOLN
INTRAMUSCULAR | Status: AC
Start: 2017-10-05 — End: ?
  Filled 2017-10-05: qty 0.5

## 2017-10-05 MED ORDER — INSULIN ASPART 100 UNIT/ML ~~LOC~~ SOLN
3.0000 [IU] | Freq: Three times a day (TID) | SUBCUTANEOUS | Status: DC
  Administered 2017-10-05: 11 [IU] via SUBCUTANEOUS
  Administered 2017-10-06: 06:00:00 20 [IU] via SUBCUTANEOUS
  Administered 2017-10-06: 15 [IU] via SUBCUTANEOUS

## 2017-10-05 MED ORDER — HEPARIN (PORCINE) IN NACL 1000-0.9 UT/500ML-% IV SOLN
INTRAVENOUS | Status: AC
  Filled 2017-10-05: qty 2000

## 2017-10-05 MED ORDER — SODIUM CHLORIDE 0.9% FLUSH: 3.0000 mL | INTRAVENOUS | Status: DC | PRN

## 2017-10-05 MED ORDER — CARVEDILOL 12.5 MG PO TABS
25.0000 mg | ORAL_TABLET | Freq: Two times a day (BID) | ORAL | Status: DC
  Administered 2017-10-05 – 2017-10-06 (×2): 25 mg via ORAL
  Filled 2017-10-05 (×2): qty 2

## 2017-10-05 MED ORDER — HEPARIN SODIUM (PORCINE) 1000 UNIT/ML IJ SOLN
INTRAMUSCULAR | Status: DC | PRN
  Administered 2017-10-05: 11000 [IU] via INTRAVENOUS

## 2017-10-05 MED ORDER — INSULIN ASPART 100 UNIT/ML ~~LOC~~ SOLN
6.0000 [IU] | Freq: Once | SUBCUTANEOUS | Status: AC
  Administered 2017-10-05: 22:00:00 6 [IU] via SUBCUTANEOUS

## 2017-10-05 MED ORDER — ROSUVASTATIN CALCIUM 20 MG PO TABS
20.0000 mg | ORAL_TABLET | Freq: Every day | ORAL | Status: DC
  Administered 2017-10-05 – 2017-10-06 (×2): 20 mg via ORAL
  Filled 2017-10-05 (×2): qty 1

## 2017-10-05 MED ORDER — IOPAMIDOL (ISOVUE-370) INJECTION 76%
INTRAVENOUS | Status: DC | PRN
  Administered 2017-10-05: 65 mL via INTRA_ARTERIAL

## 2017-10-05 MED ORDER — CLOPIDOGREL BISULFATE 75 MG PO TABS: 75.0000 mg | ORAL_TABLET | ORAL | Status: DC

## 2017-10-05 MED ORDER — LORATADINE 10 MG PO TABS
10.0000 mg | ORAL_TABLET | Freq: Every day | ORAL | Status: DC
  Administered 2017-10-05 – 2017-10-06 (×2): 10 mg via ORAL
  Filled 2017-10-05 (×2): qty 1

## 2017-10-05 SURGICAL SUPPLY — 23 items
BALLN SAPPHIRE 2.5X15 (BALLOONS) ×2
BALLOON SAPPHIRE 2.5X15 (BALLOONS) IMPLANT
CATH MACH1 8F AL1 100CM (CATHETERS) ×1 IMPLANT
CATH MICRO ASAHI CORSAIR 150CM (MICROCATHETER) ×1 IMPLANT
CATH TURNPIKE 135CM (CATHETERS) ×1 IMPLANT
COVER PRB 48X5XTLSCP FOLD TPE (BAG) IMPLANT
COVER PROBE 5X48 (BAG) ×2
GLIDESHEATH SLEND SS 6F .021 (SHEATH) ×1 IMPLANT
HOVERMATT SINGLE USE (MISCELLANEOUS) ×1 IMPLANT
KIT ENCORE 26 ADVANTAGE (KITS) ×2 IMPLANT
KIT HEART LEFT (KITS) ×2 IMPLANT
KIT HEMO VALVE WATCHDOG (MISCELLANEOUS) ×1 IMPLANT
NDL 18GX3-1/2 9CM (NEEDLE) IMPLANT
NEEDLE 18GX3-1/2 9CM (NEEDLE) ×2 IMPLANT
PACK CARDIAC CATHETERIZATION (CUSTOM PROCEDURE TRAY) ×2 IMPLANT
SHEATH BRITE TIP 8FR 35CM (SHEATH) ×1 IMPLANT
SHEATH PINNACLE 5F 10CM (SHEATH) ×1 IMPLANT
TRANSDUCER W/STOPCOCK (MISCELLANEOUS) ×3 IMPLANT
TUBING CIL FLEX 10 FLL-RA (TUBING) ×3 IMPLANT
WIRE ASAHI PROWATER 180CM (WIRE) ×1 IMPLANT
WIRE EMERALD 3MM-J .035X150CM (WIRE) ×1 IMPLANT
WIRE FIGHTER CROSSING 190CM (WIRE) ×1 IMPLANT
WIRE HI TORQ PILOT-200 300CM (WIRE) ×1 IMPLANT

## 2017-10-05 NOTE — Progress Notes (Signed)
Pt allergic to ASA and IV dye. Premeds at home with Decadron 3 doses and benedryl 2 doses as ordered by doctor. Will not give ASA. CBG 362  Reported to Janne Napoleon, RN cath lab

## 2017-10-05 NOTE — Interval H&P Note (Signed)
Cath Lab Visit (complete for each Cath Lab visit)  Clinical Evaluation Leading to the Procedure:   ACS: No.  Non-ACS:    Anginal Classification: CCS III  Anti-ischemic medical therapy: Maximal Therapy (2 or more classes of medications)  Non-Invasive Test Results: No non-invasive testing performed  Prior CABG: No previous CABG      History and Physical Interval Note:  10/05/2017 1:53 PM  Brittney Banks  has presented today for surgery, with the diagnosis of cad  The various methods of treatment have been discussed with the patient and family. After consideration of risks, benefits and other options for treatment, the patient has consented to  Procedure(s): CORONARY CTO INTERVENTION (N/A) as a surgical intervention .  The patient's history has been reviewed, patient examined, no change in status, stable for surgery.  I have reviewed the patient's chart and labs.  Questions were answered to the patient's satisfaction.     Lance Muss

## 2017-10-05 NOTE — Progress Notes (Signed)
CBG elevated still at 326 after first dose of Novolog. Geoffry Paradise, NP called and second dose of 5 units Novolog given. After rechecking again CBG 312. My nurse tech rechecked shortly after this and it is 330. Mardella Layman called again and stated we will just leave it for now.

## 2017-10-05 NOTE — Progress Notes (Signed)
  Echocardiogram 2D Echocardiogram has been performed.  Olaf Mesa T Erynn Vaca 10/05/2017, 5:00 PM

## 2017-10-05 NOTE — Interval H&P Note (Signed)
History and Physical Interval Note:  10/05/2017 1:54 PM  Brittney Banks  has presented today for surgery, with the diagnosis of cad  The various methods of treatment have been discussed with the patient and family. After consideration of risks, benefits and other options for treatment, the patient has consented to  Procedure(s): CORONARY CTO INTERVENTION (N/A) as a surgical intervention .  The patient's history has been reviewed, patient examined, no change in status, stable for surgery.  I have reviewed the patient's chart and labs.  Questions were answered to the patient's satisfaction.    Procedure described in detail to patient and family.  Risks and benefits as well.  All questions answered.     Lance Muss

## 2017-10-05 NOTE — Progress Notes (Signed)
8 french sheath removed from right femoral artery and pressure held x 25 minutes.  Vitals stable throughout sheath removal.  Site looks good with no hematoma level 0.  Distal DP and PT pulses intact 2+.  Bedrest instructions given.  Site dressed with 4x4 and tegaderm.  RN will continue to monitor site.

## 2017-10-06 ENCOUNTER — Encounter (HOSPITAL_COMMUNITY): Payer: Self-pay | Admitting: Interventional Cardiology

## 2017-10-06 DIAGNOSIS — E1165 Type 2 diabetes mellitus with hyperglycemia: Secondary | ICD-10-CM | POA: Diagnosis not present

## 2017-10-06 DIAGNOSIS — I252 Old myocardial infarction: Secondary | ICD-10-CM | POA: Diagnosis not present

## 2017-10-06 DIAGNOSIS — I2582 Chronic total occlusion of coronary artery: Secondary | ICD-10-CM | POA: Diagnosis not present

## 2017-10-06 DIAGNOSIS — I209 Angina pectoris, unspecified: Secondary | ICD-10-CM | POA: Diagnosis not present

## 2017-10-06 DIAGNOSIS — I313 Pericardial effusion (noninflammatory): Secondary | ICD-10-CM | POA: Diagnosis not present

## 2017-10-06 DIAGNOSIS — I10 Essential (primary) hypertension: Secondary | ICD-10-CM

## 2017-10-06 DIAGNOSIS — G894 Chronic pain syndrome: Secondary | ICD-10-CM

## 2017-10-06 DIAGNOSIS — I25119 Atherosclerotic heart disease of native coronary artery with unspecified angina pectoris: Secondary | ICD-10-CM | POA: Diagnosis not present

## 2017-10-06 LAB — GLUCOSE, CAPILLARY
GLUCOSE-CAPILLARY: 334 mg/dL — AB (ref 65–99)
Glucose-Capillary: 404 mg/dL — ABNORMAL HIGH (ref 65–99)

## 2017-10-06 LAB — POCT ACTIVATED CLOTTING TIME: Activated Clotting Time: 175 seconds

## 2017-10-06 LAB — MRSA PCR SCREENING: MRSA by PCR: NEGATIVE

## 2017-10-06 MED ORDER — DEXLANSOPRAZOLE 30 MG PO CPDR: 30.0000 mg | DELAYED_RELEASE_CAPSULE | Freq: Every day | ORAL | 3 refills | Status: DC

## 2017-10-06 MED ORDER — INSULIN DETEMIR 100 UNIT/ML ~~LOC~~ SOLN: 30.0000 [IU] | Freq: Every day | SUBCUTANEOUS | 11 refills | Status: DC

## 2017-10-06 MED FILL — Nitroglycerin IV Soln 100 MCG/ML in D5W: INTRA_ARTERIAL | Qty: 10 | Status: AC

## 2017-10-06 MED FILL — Verapamil HCl IV Soln 2.5 MG/ML: INTRAVENOUS | Qty: 2 | Status: AC

## 2017-10-06 NOTE — Discharge Instructions (Signed)

## 2017-10-06 NOTE — Progress Notes (Addendum)
Inpatient Diabetes Program Recommendations  AACE/ADA: New Consensus Statement on Inpatient Glycemic Control (2015)  Target Ranges:  Prepandial:   less than 140 mg/dL      Peak postprandial:   less than 180 mg/dL (1-2 hours)      Critically ill patients:  140 - 180 mg/dL   Lab Results  Component Value Date   GLUCAP 404 (H) 10/06/2017   HGBA1C 10.8 (H) 08/15/2016    Review of Glycemic Control Results for Brittney Banks, Brittney Banks (MRN [id number]) as of 10/06/2017 10:47  Ref. Range 10/05/2017 15:43 10/05/2017 18:25 10/05/2017 22:02 10/06/2017 05:54  Glucose-Capillary Latest Ref Range: 65 - 99 mg/dL 333 (H) 545 (H) 625 (H) 404 (H)   Diabetes history: Type 2 DM Outpatient Diabetes medications: Lantus 20 units QHS, Novolog 3-20 units TID, Metformin 850 mg TID Current orders for Inpatient glycemic control:  Lantus 20 units QHS, Novolog 3-20 units TID, Metformin 850 mg TID   Inpatient Diabetes Program Recommendations:    FSBS was 404 mg/dL this AM. Thus, needing insulin adjustments.  1. Consider obtaining an updated A1C, last one noted was from March and is not reflective of average BS while inpatient. 2. Increase Lantus to 36 units QHS. 3. Add  meal coverage Novolog 8 units TID (assuming that patient is consuming >50%), Novolog 0-20 units TID and Novolog 0-5 units QHS under the Glycemic control order set.    Will plan to speak with patient today.  Thanks, Lujean Rave, MSN, RNC-OB Diabetes Coordinator [phone] (8a-5p)  Addendum@ 1205: Spoke with RN regarding patient. Per nurse, patient has had family bringing in outside food that is not consistent with carb modified diet. This may be contributing to BS elevations. Spoke with [name], [place] regarding AM BS, current DC med rec plan, and discussed outpatient plan following hospitalization. Encouraged to increase basal dose to Lantus 30 units QD as a safe start. PA to update Lantus for DC. Additionally, explained the need for patient to follow up with  PCP & endo for appropriate DM management. LM

## 2017-10-06 NOTE — Plan of Care (Signed)
  RD consulted for nutrition education regarding diabetes.   Lab Results  Component Value Date   HGBA1C 10.8 (H) 08/15/2016    RD provided "Carbohydrate Counting for People with Diabetes" handout from the Academy of Nutrition and Dietetics. Discussed different food groups and their effects on blood sugar, emphasizing carbohydrate-containing foods. Provided list of carbohydrates and recommended serving sizes of common foods.  Discussed importance of controlled and consistent carbohydrate intake throughout the day. Provided examples of ways to balance meals/snacks and encouraged intake of high-fiber, whole grain complex carbohydrates. Reinforced importance of following heart healthy diet as well. Teach back method used.  Expect good compliance.  Body mass index is 41.48 kg/m. Pt meets criteria for morbid obesity based on current BMI.  Current diet order is Heart Healthy/Carb Modified, patient is consuming approximately 100% of meals at this time. Labs and medications reviewed. Patient for discharge today. No further nutrition interventions warranted at this time. RD contact information provided. If additional nutrition issues arise, please re-consult RD.  Romelle Starcher MS, RD, LDN, CNSC [phone] Pager  [phone] Weekend/On-Call Pager

## 2017-10-06 NOTE — Consult Note (Signed)
   [facility] CM Inpatient Consult   10/06/2017  Brittney Banks Feb 21, 1971 [id number]  Referral received for Diabetes Management in patient with cardiovascular disease.Referral states patient's fasting blood sugars are around 400 mg/dl during her stay.  Patient evaluated for community based chronic disease management services with [facility] Care Management Program as a benefit of patient's [employer]. Spoke with patient at bedside to explain [facility] Care Management services for post hospital follow up and community support. Patient states she may need some home visits later on. Consent form signed and folder with [facility] Care Management was given.  Patient endorses Juleen China, MD as her primary care provider.  Patient will receive post hospital discharge call and will be assessed for community follow up needs and disease process education.  Left contact information and [facility] literature at bedside. Inpatient Case Manager, Gavin Pound,  aware that [facility] Care Management following. Of note, [facility] Care Management services does not replace or interfere with any services that are arranged by inpatient case management or social work.  For additional questions or referrals please contact:    Charlesetta Shanks, RN BSN CCM [facility]  [phone] business mobile phone Toll free office [phone]

## 2017-10-06 NOTE — Progress Notes (Signed)
Inpatient Diabetes Program Recommendations  AACE/ADA: New Consensus Statement on Inpatient Glycemic Control (2015)  Target Ranges:  Prepandial:   less than 140 mg/dL      Peak postprandial:   less than 180 mg/dL (1-2 hours)      Critically ill patients:  140 - 180 mg/dL   Lab Results  Component Value Date   GLUCAP 334 (H) 10/06/2017   HGBA1C 10.8 (H) 08/15/2016    Spoke with patient at length regarding diabetes and outpatient plan.   Patient reports checking BS on average 3 times per day and normal FSBS are >70-120mg /dL. Her BS when she checks at meals are around 250's. She has the supplies she needs. She endorses that she has been under more stress lately because of her husband having new onset seizures. Additionally, she reports taking Decadron as a pre-op medication the day before her cath.  Reviewed patient's current A1c of 9.9% from March of this year. Patient explains that another A1C has been drawn since then, however, I am unable to see any updates. Explained what a A1c is and what it measures. Also reviewed goal A1c with patient, importance of good glucose control @ home, and blood sugar goals. Reviewed patho of DM, vascular changes that occurs from poor control, long term co-morbidies associated, and the impact of steroids to BS. We discussed goal setting, survival skills and resources that were available. Informed of the positive impact of activity to BS and to increase as MD advises. Diet was also reviewed, carb counting, and how to apply this using nutritional labels. Educated on [employer] as a tool to help establish better control.   Placed consults for dietitian, [facility], and outpatient education continued follow up. Provided information on local endocrinologist and encouraged patient to make appointments with endo and PCP. Additionally, encouraged patient to communicate changes to BS, find out latest A1C value, and follow up if she felt she was having lows or BS >250-300's. At  this time, patient has no further questions at this time.   Thanks, Lujean Rave, MSN, RNC-OB Diabetes Coordinator [phone] (8a-5p)

## 2017-10-06 NOTE — Discharge Summary (Addendum)
Discharge Summary    Patient ID: Brittney Banks,  MRN: [id number], DOB/AGE: 46-Jul-1972 46 y.o.  Admit date: 10/05/2017 Discharge date: 10/06/2017  Primary Care Provider: Ronita Hipps Primary Cardiologist: Sinclair Grooms, MD  Discharge Diagnoses    Principal Problem:   Coronary artery disease involving native coronary artery of native heart with angina pectoris [facility]) Active Problems:   Chronic pain syndrome   Diabetes mellitus type 2 in obese ([place])   HTN (hypertension)   Angina pectoris ([place])   Allergies Allergies  Allergen Reactions  . Anaprox [Naproxen Sodium] Shortness Of Breath and Other (See Comments)    Redness of skin and difficulty breathing   . Aspirin Nausea And Vomiting and Other (See Comments)    Severe cramping, vomiting   . Brilinta [Ticagrelor] Shortness Of Breath  . Ceftin [Cefuroxime Axetil] Shortness Of Breath and Other (See Comments)    Skin turns red   . Celexa [Citalopram Hydrobromide] Shortness Of Breath  . Ciprofloxacin Shortness Of Breath and Other (See Comments)    Skin turns red   . Coconut Fragrance Hives, Shortness Of Breath, Swelling and Other (See Comments)    Reaction to any kind of coconut Mouth and tongue swelling  . Contrast Media [Iodinated Diagnostic Agents] Shortness Of Breath    Breathing difficulty  . Cortisol [Hydrocortisone] Shortness Of Breath    Breathing difficulty and redness of skin  . Elavil [Amitriptyline] Shortness Of Breath     Redness of skin and blisters of skin Difficulty breathing  . Hctz [Hydrochlorothiazide] Shortness Of Breath  . Imitrex [Sumatriptan] Shortness Of Breath    Redness and difficulty breathing   . Lexapro [Escitalopram Oxalate] Shortness Of Breath    Breathing difficulty Redness to skin  . Methadone Nausea And Vomiting    Severely vomiting   . Metrizamide Shortness Of Breath    Breathing difficulty  . Motrin [Ibuprofen] Nausea And Vomiting  . Naprosyn [Naproxen] Shortness Of  Breath    Breathing difficulty Redness of skin  . Neurontin [Gabapentin] Shortness Of Breath    Breathing difficulty  Redness to skin Breathing difficulty  Redness to skin  . Penicillins Shortness Of Breath and Rash    Has patient had a PCN reaction causing immediate rash, facial/tongue/throat swelling, SOB or lightheadedness with hypotension: Yes Has patient had a PCN reaction causing severe rash involving mucus membranes or skin necrosis: Yes Has patient had a PCN reaction that required hospitalization: Yes Has patient had a PCN reaction occurring within the last 10 years: No If all of the above answers are "NO", then may proceed with Cephalosporin use.   . Sulfa Antibiotics Shortness Of Breath    Severe breathing difficulty And redness of skin  . Sulfasalazine Shortness Of Breath    Severe breathing difficulty And redness of skin  . Tagamet [Cimetidine] Nausea And Vomiting  . Toradol [Ketorolac Tromethamine] Shortness Of Breath    Redness to skin  . Vancomycin Rash    Patient Confirms Red mans syndome type rxn - Tolerated a slow infusion in past  . Zoloft [Sertraline Hcl] Shortness Of Breath and Other (See Comments)    Breathing difficulty Redness to skin  . Colcrys [Colchicine] Nausea And Vomiting  . Iodine Swelling and Other (See Comments)    Redness to skin  . Lipitor [Atorvastatin] Diarrhea  . Ranexa [Ranolazine] Other (See Comments)    "Made symptoms (heart racing and pain) worse"    Diagnostic Studies/Procedures    Coronary CTO  intervention 10/05/17:  Conclusion     Mid RCA to Dist RCA lesion is 100% stenosed.  Mid RCA lesion is 100% stenosed. Balloon angioplasty was performed.  Post intervention, there is a 100% residual stenosis.  Balloon angioplasty was performed.   Unable to open CTO.  Continue medical therapy.    Plan for discharge tomorrow.     Limited Echocardiogram 10/05/17 Study Conclusions  - Left ventricle: The cavity size was normal.  Wall thickness was   normal. Systolic function was normal. The estimated ejection   fraction was in the range of 55% to 60%. Basal inferior   hypokinesis. - Aortic valve: There was no stenosis. - Right ventricle: The cavity size was normal. Systolic function   was normal. - Pericardium, extracardiac: There was no pericardial effusion.  Impressions:  - Limited echo for pericardial effusion. _____________   History of Present Illness     Brittney Banks is a 47 y.o. 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 Her STEMI occurred in 07/2016 and she underwent an emergent cath and found to have 100% RCA stenosis and had PCI with ONYX, DES and catheter based thrombectomy prior to stenting. Noted tohave residual CAD. Had persistent chest discomfort for months following initial presentation and ultimately underwent PCI and stenting of a small caliber mid to distal LAD in July 2018. Over the past several months she has reported frequent chest pain with activity, progressing to occurring at rest, as well as decreased exercise tolerance. She was seen outpatient by Dr. Irish Lack for consideration of CTO intervention to the RCA and was thought to be a good candidate.     Hospital Course     Consultants: None  1. CAD s/p multiple PCI/DES and CTO of RCA: patient presented with frequent chest pain and significantly decreased exercise tolerance. She underwent Coronary CTO intervention which unfortunately was unsuccessful with 100% stenosis despite balloon angioplasty to mid RCA lesion. She was recommended for continued medical management.  - Continue ASA, plavix, statin, imdur, and coreg - Home lisinopril held at discharge. If her BP is improved at follow-up visit, could consider adding amlodipine for anti-anginal effects.   2. HTN:  BP stable throughout admission - Continue coreg - Home lisinopril held at discharge. If her BP is improved at follow-up visit, could consider adding amlodipine for anti-anginal effects.   3. DM type 2: poorly controlled with finger sticks consistently >250; last A1C 10.8 - Encourage patient to follow-up with PCP closely for better glycemic control. Would ultimately benefit from seeing an endocrinologist. - Increased home levemir to 30 u qHS. Continued remainder of home medications  4. HLD: Last LDL well controlled - 47 - Continue home statin  5. GERD:  - Home omeprazole discontinued due to interaction with plavix (decreased efficacy of plavix) - Rx for dexilant given    _____________  Discharge Vitals Blood pressure (!) 102/58, pulse 78, temperature 98.5 F (36.9 C), temperature source Oral, resp. rate 19, height '5\' 9"'$  (1.753 m), weight 280 lb 13.9 oz (127.4 kg), SpO2 95 %.  Filed Weights   10/05/17 1019 10/06/17 0400  Weight: 264 lb (119.7 kg) 280 lb 13.9 oz (127.4 kg)   Physical exam on the day of discharge:  GEN:No acute distress.   Neck:No JVD, no carotid bruits Cardiac: RRR, no murmurs, rubs, or gallops. R groin site is without  significant ecchymosis or hematoma, no bleeding  Respiratory:Clear to auscultation bilaterally; no wheezes/rales/ rhonchi OM:VEHM, Soft, obese, nontender, non-distended  MS:No edema; No deformity. Neuro:Nonfocal, moving all extremities spontaneously Psych: Normal affect      Labs & Radiologic Studies    CBC No results for input(s): WBC, NEUTROABS, HGB, HCT, MCV, PLT in the last 72 hours. Basic Metabolic Panel No results for input(s): NA, K, CL, CO2, GLUCOSE, BUN, CREATININE, CALCIUM, MG, PHOS in the last 72 hours. Liver Function Tests No results for input(s): AST, ALT, ALKPHOS, BILITOT, PROT, ALBUMIN in the last 72 hours. No results for input(s): LIPASE, AMYLASE in the last 72 hours. Cardiac Enzymes No results for input(s): CKTOTAL,  CKMB, CKMBINDEX, TROPONINI in the last 72 hours. BNP Invalid input(s): POCBNP D-Dimer No results for input(s): DDIMER in the last 72 hours. Hemoglobin A1C No results for input(s): HGBA1C in the last 72 hours. Fasting Lipid Panel No results for input(s): CHOL, HDL, LDLCALC, TRIG, CHOLHDL, LDLDIRECT in the last 72 hours. Thyroid Function Tests No results for input(s): TSH, T4TOTAL, T3FREE, THYROIDAB in the last 72 hours.  Invalid input(s): FREET3 _____________  No results found. Disposition   Patient was seen and examined by Dr. Ellyn Hack who deemed patient as stable for discharge. Follow-up has been arranged. Discharge medications as listed below.   Follow-up Plans & Appointments    Follow-up Information    Jettie Booze, MD Follow up on 10/17/2017.   Specialties:  Cardiology, Radiology, Interventional Cardiology Why:  Please arrive 15 minutes early for your 3:40pm appointment Contact information: [address] [place] [address] [phone]            Discharge Medications   Allergies as of 10/06/2017      Reactions   Anaprox [naproxen Sodium] Shortness Of Breath, Other (See Comments)   Redness of skin and difficulty breathing   Aspirin Nausea And Vomiting, Other (See Comments)   Severe cramping, vomiting   Brilinta [ticagrelor] Shortness Of Breath   Ceftin [cefuroxime Axetil] Shortness Of Breath, Other (See Comments)   Skin turns red   Celexa [citalopram Hydrobromide] Shortness Of Breath   Ciprofloxacin Shortness Of Breath, Other (See Comments)   Skin turns red   Coconut Fragrance Hives, Shortness Of Breath, Swelling, Other (See Comments)   Reaction to any kind of coconut Mouth and tongue swelling   Contrast Media [iodinated Diagnostic Agents] Shortness Of Breath   Breathing difficulty   Cortisol [hydrocortisone] Shortness Of Breath   Breathing difficulty and redness of skin   Elavil [amitriptyline] Shortness Of Breath   Redness of  skin and blisters of skin Difficulty breathing   Hctz [hydrochlorothiazide] Shortness Of Breath   Imitrex [sumatriptan] Shortness Of Breath   Redness and difficulty breathing   Lexapro [escitalopram Oxalate] Shortness Of Breath   Breathing difficulty Redness to skin   Methadone Nausea And Vomiting   Severely vomiting   Metrizamide Shortness Of Breath   Breathing difficulty   Motrin [ibuprofen] Nausea And Vomiting   Naprosyn [naproxen] Shortness Of Breath   Breathing difficulty Redness of skin   Neurontin [gabapentin] Shortness Of Breath   Breathing difficulty  Redness to skin Breathing difficulty  Redness to skin   Penicillins Shortness Of Breath, Rash   Has patient had a PCN reaction causing immediate rash, facial/tongue/throat swelling, SOB or lightheadedness with hypotension: Yes Has patient had a PCN reaction causing severe rash involving mucus membranes or skin necrosis: Yes Has patient had a PCN reaction that required hospitalization:  Yes Has patient had a PCN reaction occurring within the last 10 years: No If all of the above answers are "NO", then may proceed with Cephalosporin use.   Sulfa Antibiotics Shortness Of Breath   Severe breathing difficulty And redness of skin   Sulfasalazine Shortness Of Breath   Severe breathing difficulty And redness of skin   Tagamet [cimetidine] Nausea And Vomiting   Toradol [ketorolac Tromethamine] Shortness Of Breath   Redness to skin   Vancomycin Rash   Patient Confirms Red mans syndome type rxn - Tolerated a slow infusion in past   Zoloft [sertraline Hcl] Shortness Of Breath, Other (See Comments)   Breathing difficulty Redness to skin   Colcrys [colchicine] Nausea And Vomiting   Iodine Swelling, Other (See Comments)   Redness to skin   Lipitor [atorvastatin] Diarrhea   Ranexa [ranolazine] Other (See Comments)   "Made symptoms (heart racing and pain) worse"      Medication List    STOP taking these medications     dexamethasone 6 MG tablet Commonly known as:  DECADRON   lisinopril 20 MG tablet Commonly known as:  PRINIVIL,ZESTRIL   omeprazole 20 MG capsule Commonly known as:  PRILOSEC     TAKE these medications   carvedilol 25 MG tablet Commonly known as:  COREG Take 1 tablet (25 mg total) by mouth 2 (two) times daily.   cetirizine 10 MG tablet Commonly known as:  ZYRTEC Take 10 mg by mouth daily.   clobetasol ointment 0.05 % Commonly known as:  TEMOVATE Apply 1 application topically daily.   clopidogrel 75 MG tablet Commonly known as:  PLAVIX Take 1 tablet (75 mg total) by mouth daily.   Dexlansoprazole 30 MG capsule Take 1 capsule (30 mg total) by mouth daily.   glucose monitoring kit monitoring kit 1 each by Does not apply route as needed for other. Dispense any model that is covered- dispense testing supplies for Q AC/ HS accuchecks- 1 month supply with one refil.   insulin aspart 100 UNIT/ML FlexPen Commonly known as:  NOVOLOG FLEXPEN Inject 3-20 Units into the skin 3 (three) times daily with meals. CBG 70 - 120: 0 units  CBG 121 - 150: 3 units  CBG 151 - 200: 4 units  CBG 201 - 250: 7 units  CBG 251 - 300: 11 units  CBG 301 - 350: 15 units  CBG 351 - 400: 20 units What changed:  additional instructions   insulin detemir 100 UNIT/ML injection Commonly known as:  LEVEMIR Inject 0.2 mLs (20 Units total) into the skin at bedtime.   isosorbide mononitrate 60 MG 24 hr tablet Commonly known as:  IMDUR Take 2 tablets (120 mg total) by mouth daily.   metFORMIN 850 MG tablet Commonly known as:  GLUCOPHAGE Take 850 mg by mouth 3 (three) times daily with meals.   morphine 60 MG 12 hr tablet Commonly known as:  MS CONTIN Take 60 mg by mouth every 8 (eight) hours.   NARCAN 4 MG/0.1ML Liqd nasal spray kit Generic drug:  naloxone Call EMS at first sign of opioid overdose. Inject spray into nostril. If no response in 2 minutes, inject opposite nostril.    neomycin-bacitracin-polymyxin ointment Commonly known as:  NEOSPORIN Apply 1 application topically as needed for wound care.   nitroGLYCERIN 0.4 MG SL tablet Commonly known as:  NITROSTAT Place 1 tablet (0.4 mg total) under the tongue every 5 (five) minutes x 3 doses as needed for chest pain.   Oxycodone  HCl 20 MG Tabs Take 20 mg by mouth every 4 (four) hours.   oxymetazoline 0.05 % nasal spray Commonly known as:  AFRIN Place 1 spray into both nostrils daily as needed for congestion.   rosuvastatin 20 MG tablet Commonly known as:  CRESTOR Take 20 mg by mouth daily.        Aspirin prescribed at discharge?  Yes High Intensity Statin Prescribed? (Lipitor 40-85m or Crestor 20-456m: Yes Beta Blocker Prescribed? Yes For EF <40%, was ACEI/ARB Prescribed? No: EF >40 ADP Receptor Inhibitor Prescribed? (i.e. Plavix etc.-Includes Medically Managed Patients): Yes For EF <40%, Aldosterone Inhibitor Prescribed? No: EF >40 Was EF assessed during THIS hospitalization? Yes Was Cardiac Rehab II ordered? (Included Medically managed Patients): Yes   Outstanding Labs/Studies   Consider adding amlodipine if BP stable at follow-up  Duration of Discharge Encounter   Greater than 30 minutes including physician time.  Signed, KrAbigail ButtsA-C 10/06/2017, 10:59 AM

## 2017-10-06 NOTE — Progress Notes (Signed)
NCM made referral for [facility]

## 2017-10-16 NOTE — Progress Notes (Signed)
Cardiology Office Note   Date:  10/18/2017   ID:  Brittney Banks, DOB Dec 14, 1970, MRN [id number]  PCP:  Ronita Hipps, MD    No chief complaint on file.  CAD  Wt Readings from Last 3 Encounters:  10/17/17 278 lb 6.4 oz (126.3 kg)  10/06/17 280 lb 13.9 oz (127.4 kg)  09/26/17 276 lb (125.2 kg)       History of Present Illness: Brittney Banks is a 47 y.o. female  With prior inferior MI.  Her RCA stent occluded and she had recent RCA CTO pCI attempt.    Attempt was unsuccessful. Procedure was difficult starting with vascular access.  She was difficult to sedate as well.  THe clear stump of the occlusion that was present in Jan 2019 was gone.  THe wire went subintimal and the procedure was unsuccessful.    She has had chest pain at rest.  She has brisk left to right collaterals.    She had another rash from her skin condition after being in the hospital.  She had some groin bruising after the cath.  She still has the chest pain requiring NTG.    She had some right groin bruising but this improved.    Past Medical History:  Diagnosis Date  . Behcet's disease ([place])    "similiar to MS" (10/05/2017)  . Cellulitis 03/2016  . Chronic back pain    "all over"  . Chronic bronchitis ([place])   . Coronary artery disease    11/09/16 PCI with DES to mLAD, patent RCA stent  . DDD (degenerative disc disease), lumbar   . GERD (gastroesophageal reflux disease)   . Hyperlipidemia   . Hypertension   . LV dysfunction   . MI (myocardial infarction) ([place]) 10/2016  . Migraine    "quite a bit when I was younger; now maybe 1/year" (10/05/2017)  . MRSA carrier 2008  . MRSA infection 2008   BOTH LEGS  . Myocardial infarction involving right coronary artery ([place]) 08/15/2016  . On home oxygen therapy    "3L prn" (10/05/2017)  . Osteoarthritis    "in all my major bones" (10/05/2017)  . Pneumonia    "several times" (10/05/2017)  . Pyoderma gangrenosum    "skin autoimmune disorder" (10/05/2017)  .  Seasonal allergies   . Type II diabetes mellitus ([place])    type 2    Past Surgical History:  Procedure Laterality Date  . ANTERIOR CERVICAL DECOMP/DISCECTOMY FUSION  2008  . BILATERAL CARPAL TUNNEL RELEASE Bilateral 2007  . CORONARY CTO INTERVENTION  10/05/2017  . CORONARY CTO INTERVENTION N/A 10/05/2017   Procedure: CORONARY CTO INTERVENTION;  Surgeon: Jettie Booze, MD;  Location: [facility];  Service: Cardiovascular;  Laterality: N/A;  . CORONARY STENT INTERVENTION N/A 08/15/2016   Procedure: Coronary Stent Intervention;  Surgeon: Belva Crome, MD;  Location: [facility];  Service: Cardiovascular;  Laterality: N/A;  . CORONARY STENT INTERVENTION N/A 11/09/2016   Procedure: Coronary Stent Intervention;  Surgeon: Belva Crome, MD;  Location: [facility];  Service: Cardiovascular;  Laterality: N/A;  . ECTOPIC PREGNANCY SURGERY  X 3   "last time they took my tubes out"  . INCISION AND DRAINAGE     "had to clean MRSA off :RUE; 2 places on left inner leg; 1 place on back side of my right leg" (10/05/2017)  . JOINT REPLACEMENT    . KNEE ARTHROSCOPY Right X 2  . LEFT HEART  CATH AND CORONARY ANGIOGRAPHY N/A 08/15/2016   Procedure: Left Heart Cath and Coronary Angiography;  Surgeon: Belva Crome, MD;  Location: [facility];  Service: Cardiovascular;  Laterality: N/A;  . LEFT HEART CATH AND CORONARY ANGIOGRAPHY N/A 11/09/2016   Procedure: Left Heart Cath and Coronary Angiography;  Surgeon: Belva Crome, MD;  Location: [facility];  Service: Cardiovascular;  Laterality: N/A;  . LEFT HEART CATH AND CORONARY ANGIOGRAPHY N/A 04/19/2017   Procedure: LEFT HEART CATH AND CORONARY ANGIOGRAPHY;  Surgeon: Sherren Mocha, MD;  Location: [facility];  Service: Cardiovascular;  Laterality: N/A;  . TONSILLECTOMY    . TOTAL KNEE ARTHROPLASTY Right   . ULTRASOUND GUIDANCE FOR VASCULAR ACCESS  11/09/2016   Procedure: Ultrasound Guidance For Vascular Access;   Surgeon: Belva Crome, MD;  Location: [facility];  Service: Cardiovascular;;     Current Outpatient Medications  Medication Sig Dispense Refill  . carvedilol (COREG) 25 MG tablet Take 1 tablet (25 mg total) by mouth 2 (two) times daily. 180 tablet 3  . cetirizine (ZYRTEC) 10 MG tablet Take 10 mg by mouth daily.    . clobetasol ointment (TEMOVATE) 1.30 % Apply 1 application topically daily.  5  . clopidogrel (PLAVIX) 75 MG tablet Take 1 tablet (75 mg total) by mouth daily. 30 tablet 6  . Dexlansoprazole 30 MG capsule Take 1 capsule (30 mg total) by mouth daily. 30 capsule 3  . glucose monitoring kit (FREESTYLE) monitoring kit 1 each by Does not apply route as needed for other. Dispense any model that is covered- dispense testing supplies for Q AC/ HS accuchecks- 1 month supply with one refil. 1 each 1  . insulin aspart (NOVOLOG FLEXPEN) 100 UNIT/ML FlexPen Inject 3-20 Units into the skin 3 (three) times daily with meals. CBG 70 - 120: 0 units  CBG 121 - 150: 3 units  CBG 151 - 200: 4 units  CBG 201 - 250: 7 units  CBG 251 - 300: 11 units  CBG 301 - 350: 15 units  CBG 351 - 400: 20 units (Patient taking differently: Inject 3-20 Units into the skin 3 (three) times daily with meals. Per sliding scale) 15 mL 11  . insulin detemir (LEVEMIR) 100 UNIT/ML injection Inject 0.3 mLs (30 Units total) into the skin at bedtime. 10 mL 11  . isosorbide mononitrate (IMDUR) 60 MG 24 hr tablet Take 2 tablets (120 mg total) by mouth daily. 180 tablet 3  . lisinopril (PRINIVIL,ZESTRIL) 20 MG tablet     . metFORMIN (GLUCOPHAGE) 850 MG tablet Take 850 mg by mouth 3 (three) times daily with meals.    Marland Kitchen morphine (MS CONTIN) 60 MG 12 hr tablet Take 60 mg by mouth every 8 (eight) hours.     Marland Kitchen NARCAN 4 MG/0.1ML LIQD nasal spray kit Call EMS at first sign of opioid overdose. Inject spray into nostril. If no response in 2 minutes, inject opposite nostril.  0  . neomycin-bacitracin-polymyxin (NEOSPORIN) ointment  Apply 1 application topically as needed for wound care.    . nitroGLYCERIN (NITROSTAT) 0.4 MG SL tablet Place 1 tablet (0.4 mg total) under the tongue every 5 (five) minutes x 3 doses as needed for chest pain. 25 tablet 3  . Oxycodone HCl 20 MG TABS Take 20 mg by mouth every 4 (four) hours.     Marland Kitchen oxymetazoline (AFRIN) 0.05 % nasal spray Place 1 spray into both nostrils daily as needed for congestion.    Marland Kitchen  rosuvastatin (CRESTOR) 20 MG tablet Take 20 mg by mouth daily.     No current facility-administered medications for this visit.     Allergies:   Anaprox [naproxen sodium]; Aspirin; Brilinta [ticagrelor]; Ceftin [cefuroxime axetil]; Celexa [citalopram hydrobromide]; Ciprofloxacin; Coconut fragrance; Contrast media [iodinated diagnostic agents]; Cortisol [hydrocortisone]; Elavil [amitriptyline]; Hctz [hydrochlorothiazide]; Imitrex [sumatriptan]; Lexapro [escitalopram oxalate]; Methadone; Metrizamide; Motrin [ibuprofen]; Naprosyn [naproxen]; Neurontin [gabapentin]; Penicillins; Sulfa antibiotics; Sulfasalazine; Tagamet [cimetidine]; Toradol [ketorolac tromethamine]; Vancomycin; Zoloft [sertraline hcl]; Colcrys [colchicine]; Iodine; Lipitor [atorvastatin]; and Ranexa [ranolazine]    Social History:  The patient  reports that she quit smoking about 7 years ago. Her smoking use included cigarettes. She has a 22.00 pack-year smoking history. She has never used smokeless tobacco. She reports that she drank alcohol. She reports that she does not use drugs.   Family History:  The patient's She was adopted. Family history is unknown by patient.    ROS:  Please see the history of present illness.   Otherwise, review of systems are positive for chest pain.   All other systems are reviewed and negative.    PHYSICAL EXAM: VS:  BP 140/80   Pulse 86   Ht _0  (1.753 m)   Wt 278 lb 6.4 oz (126.3 kg)   LMP  (LMP Unknown)   SpO2 99%   BMI 41.11 kg/m  , BMI Body mass index is 41.11 kg/m. GEN: Well  nourished, well developed, in no acute distress  HEENT: normal  Neck: no JVD, carotid bruits, or masses Cardiac: RRR; no murmurs, rubs, or gallops,no edema  Respiratory:  clear to auscultation bilaterally, normal work of breathing GI: soft, nontender, nondistended, + BS, obese MS: no deformity or atrophy ; no right groin or left groin hematoma; faint right radial pulse present Skin: warm and dry, rash on both legs Neuro:  Strength and sensation are intact Psych: euthymic mood, full affect      Recent Labs: 11/07/2016: ALT 42 10/02/2017: BUN 6; Creatinine, Ser 0.70; Hemoglobin 16.0; Platelets 174; Potassium 4.6; Sodium 137   Lipid Panel    Component Value Date/Time   CHOL 125 11/07/2016 1124   TRIG 206 (H) 11/07/2016 1124   HDL 37 (L) 11/07/2016 1124   CHOLHDL 3.4 11/07/2016 1124   CHOLHDL 6.9 08/16/2016 0342   VLDL 24 08/16/2016 0342   LDLCALC 47 11/07/2016 1124     Other studies Reviewed: Additional studies/ records that were reviewed today with results demonstrating: cath result reviewed with the patient.   ASSESSMENT AND PLAN:  1. CAD: Angina pectoris.  COntinue medical therapy.  She has brisk collaterals.  Did not tolerate Ranexa.  Right groin stable.  On our PCI attempt, the initial stump in the mid RCA was lost which made equipment go into the RV marginal branch.   2. Lipids well controlled.  COntinue statin.      Current medicines are reviewed at length with the patient today.  The patient concerns regarding her medicines were addressed.  The following changes have been made:  No change  Labs/ tests ordered today include:  No orders of the defined types were placed in this encounter.   Recommend 150 minutes/week of aerobic exercise Low fat, low carb, high fiber diet recommended  Disposition:   FU with Dr. Tamala Julian   Signed, Larae Grooms, MD  10/18/2017 [address], [place], [place]  [address] Phone:  [phone]; Fax: [phone]

## 2017-10-17 ENCOUNTER — Ambulatory Visit (INDEPENDENT_AMBULATORY_CARE_PROVIDER_SITE_OTHER): Payer: Medicare Other | Admitting: Interventional Cardiology

## 2017-10-17 ENCOUNTER — Encounter: Payer: Self-pay | Admitting: Interventional Cardiology

## 2017-10-17 VITALS — BP 140/80 | HR 86 | Ht 69.0 in | Wt 278.4 lb

## 2017-10-17 DIAGNOSIS — I25119 Atherosclerotic heart disease of native coronary artery with unspecified angina pectoris: Secondary | ICD-10-CM

## 2017-10-17 DIAGNOSIS — E782 Mixed hyperlipidemia: Secondary | ICD-10-CM

## 2017-10-17 NOTE — Patient Instructions (Signed)
Medication Instructions:  Your physician recommends that you continue on your current medications as directed. Please refer to the Current Medication list given to you today.   Labwork: None ordered  Testing/Procedures: None ordered  Follow-Up: Your physician wants you to follow-up with DR. Katrinka Blazing in 2-3 months   Any Other Special Instructions Will Be Listed Below (If Applicable).     If you need a refill on your cardiac medications before your next appointment, please call your pharmacy.

## 2017-10-18 DIAGNOSIS — M1990 Unspecified osteoarthritis, unspecified site: Secondary | ICD-10-CM | POA: Diagnosis not present

## 2017-10-18 DIAGNOSIS — M5030 Other cervical disc degeneration, unspecified cervical region: Secondary | ICD-10-CM | POA: Diagnosis not present

## 2017-10-18 DIAGNOSIS — Z5181 Encounter for therapeutic drug level monitoring: Secondary | ICD-10-CM | POA: Diagnosis not present

## 2017-10-18 DIAGNOSIS — G894 Chronic pain syndrome: Secondary | ICD-10-CM | POA: Diagnosis not present

## 2017-10-27 ENCOUNTER — Other Ambulatory Visit: Payer: Self-pay

## 2017-10-27 NOTE — Patient Outreach (Signed)
[facility]) Care Management  10/27/2017  Brittney Banks 26-Mar-1971 [id number]   1st outreach attempt to the patient for initial assessment. No answer. HIPAA compliant voice mail left with contact information.  Plan: RN Health Coach will send letter. RN Health Coach will make another attempt to the patient within four business days.  [name] RN, BSN, [facility] RN Health Coach Disease Management [facility] Dial:  [phone]  Fax: [phone]

## 2017-10-31 ENCOUNTER — Other Ambulatory Visit: Payer: Self-pay

## 2017-10-31 NOTE — Patient Outreach (Signed)
[facility]) Care Management  10/31/2017  LIONOR HILDE Sep 03, 1970 [id number]   2nd outreach attempt to the patient for initial assessment. No answer. HIPAA compliant message left with contact information.  Plan: RN Health Coach will make another outreach attempt to the patient within the next four business days.  [name] RN, BSN, [facility] RN Health Coach Disease Management [facility] Dial:  [phone]  Fax: [phone]

## 2017-11-02 DIAGNOSIS — L88 Pyoderma gangrenosum: Secondary | ICD-10-CM | POA: Diagnosis not present

## 2017-11-03 ENCOUNTER — Other Ambulatory Visit: Payer: Self-pay

## 2017-11-03 NOTE — Patient Outreach (Signed)
[facility]) Care Management  11/03/2017  Brittney Banks 10-31-1970 [id number]   3rd unsuccessful attempt to the patient for initital assessment. No answer. HIPAA compliant voicemail left with contact information.  Plan: If no response to calls and letter in ten business days. Rn health Coach will proceed with case closure.    Brittney Fairly RN, BSN, [facility] RN Health Coach Disease Management [facility] Dial:  [phone]  Fax: [phone]

## 2017-11-08 ENCOUNTER — Other Ambulatory Visit: Payer: Self-pay

## 2017-11-08 NOTE — Patient Outreach (Signed)
[facility]) Care Management  11/08/2017  Brittney Banks 1971/01/25 [id number]   The patient has not responded to calls or letter.   Plan: RN Health Coach will proceed with case closure due to unable to contact.    [name] RN, BSN, [facility] RN Health Coach Disease Management [facility] Dial:  [phone]  Fax: [phone]

## 2017-11-14 DIAGNOSIS — M1990 Unspecified osteoarthritis, unspecified site: Secondary | ICD-10-CM | POA: Diagnosis not present

## 2017-11-14 DIAGNOSIS — M5030 Other cervical disc degeneration, unspecified cervical region: Secondary | ICD-10-CM | POA: Diagnosis not present

## 2017-11-14 DIAGNOSIS — G894 Chronic pain syndrome: Secondary | ICD-10-CM | POA: Diagnosis not present

## 2017-11-14 DIAGNOSIS — Z5181 Encounter for therapeutic drug level monitoring: Secondary | ICD-10-CM | POA: Diagnosis not present

## 2017-12-12 DIAGNOSIS — M5030 Other cervical disc degeneration, unspecified cervical region: Secondary | ICD-10-CM | POA: Diagnosis not present

## 2017-12-12 DIAGNOSIS — M1990 Unspecified osteoarthritis, unspecified site: Secondary | ICD-10-CM | POA: Diagnosis not present

## 2017-12-12 DIAGNOSIS — Z5181 Encounter for therapeutic drug level monitoring: Secondary | ICD-10-CM | POA: Diagnosis not present

## 2017-12-12 DIAGNOSIS — G894 Chronic pain syndrome: Secondary | ICD-10-CM | POA: Diagnosis not present

## 2017-12-19 DIAGNOSIS — G894 Chronic pain syndrome: Secondary | ICD-10-CM | POA: Diagnosis not present

## 2017-12-19 DIAGNOSIS — M1990 Unspecified osteoarthritis, unspecified site: Secondary | ICD-10-CM | POA: Diagnosis not present

## 2017-12-19 DIAGNOSIS — M5030 Other cervical disc degeneration, unspecified cervical region: Secondary | ICD-10-CM | POA: Diagnosis not present

## 2017-12-19 DIAGNOSIS — Z5181 Encounter for therapeutic drug level monitoring: Secondary | ICD-10-CM | POA: Diagnosis not present

## 2017-12-21 DIAGNOSIS — Z79899 Other long term (current) drug therapy: Secondary | ICD-10-CM | POA: Diagnosis not present

## 2017-12-21 DIAGNOSIS — L648 Other androgenic alopecia: Secondary | ICD-10-CM | POA: Diagnosis not present

## 2017-12-21 DIAGNOSIS — L88 Pyoderma gangrenosum: Secondary | ICD-10-CM | POA: Diagnosis not present

## 2018-01-11 ENCOUNTER — Other Ambulatory Visit: Payer: Self-pay | Admitting: Cardiology

## 2018-01-15 DIAGNOSIS — M1990 Unspecified osteoarthritis, unspecified site: Secondary | ICD-10-CM | POA: Diagnosis not present

## 2018-01-15 DIAGNOSIS — G894 Chronic pain syndrome: Secondary | ICD-10-CM | POA: Diagnosis not present

## 2018-01-15 DIAGNOSIS — M5030 Other cervical disc degeneration, unspecified cervical region: Secondary | ICD-10-CM | POA: Diagnosis not present

## 2018-01-15 DIAGNOSIS — Z5181 Encounter for therapeutic drug level monitoring: Secondary | ICD-10-CM | POA: Diagnosis not present

## 2018-01-16 ENCOUNTER — Other Ambulatory Visit: Payer: Self-pay | Admitting: Interventional Cardiology

## 2018-01-16 MED ORDER — NITROGLYCERIN 0.4 MG SL SUBL: 0.4000 mg | SUBLINGUAL_TABLET | SUBLINGUAL | 6 refills | Status: DC | PRN

## 2018-01-25 ENCOUNTER — Ambulatory Visit (INDEPENDENT_AMBULATORY_CARE_PROVIDER_SITE_OTHER): Payer: Medicare Other | Admitting: Interventional Cardiology

## 2018-01-25 ENCOUNTER — Encounter: Payer: Self-pay | Admitting: Interventional Cardiology

## 2018-01-25 VITALS — BP 124/82 | HR 87 | Ht 69.0 in | Wt 287.8 lb

## 2018-01-25 DIAGNOSIS — E1169 Type 2 diabetes mellitus with other specified complication: Secondary | ICD-10-CM | POA: Diagnosis not present

## 2018-01-25 DIAGNOSIS — I10 Essential (primary) hypertension: Secondary | ICD-10-CM | POA: Diagnosis not present

## 2018-01-25 DIAGNOSIS — E782 Mixed hyperlipidemia: Secondary | ICD-10-CM | POA: Diagnosis not present

## 2018-01-25 DIAGNOSIS — E669 Obesity, unspecified: Secondary | ICD-10-CM

## 2018-01-25 DIAGNOSIS — I25119 Atherosclerotic heart disease of native coronary artery with unspecified angina pectoris: Secondary | ICD-10-CM | POA: Diagnosis not present

## 2018-01-25 DIAGNOSIS — M352 Behcet's disease: Secondary | ICD-10-CM

## 2018-01-25 NOTE — Progress Notes (Signed)
Cardiology Office Note:    Date:  01/25/2018   ID:  Brittney Banks, DOB 12/08/1970, MRN [id number]  PCP:  Ronita Hipps, MD  Cardiologist:  Sinclair Grooms, MD   Referring MD: Ronita Hipps, MD   Chief Complaint  Patient presents with  . Coronary Artery Disease    Angina    History of Present Illness:    Brittney Banks is a 47 y.o. female with a history of hypertension, prior smoker, diabetes mellitus type 2, hyperlipidemia,, hirsutism, Behcet's disease, known CAD with inferior STEMI treated with Onyx DES April 2018, recurring symptoms led to 2.25 Onyx DES of high-grade small LAD July 2018, subsequent recurrent pain January 2019 demonstrating total occlusion of the RCA with development of collaterals.  Failed CTO attempt on the right coronary in July 2019.Marland Kitchen  Over the past 2 months she has had progressive angina.  She has had a higher housekeeper to help with chores around the house.  She cannot walk very far without dyspnea and angina.  This is despite maximal medical therapy including carvedilol 25 mg twice daily, Imdur 120 mg/day, Prinivil 20 mg/day, and Crestor 20 mg/day.  She was unable to tolerate Ranexa.  She feels her quality of life is unacceptable.  She stopped smoking in 2012.  Hemoglobin A1c and LDL were 6.0 and 42 respectively in September 2019.     Past Medical History:  Diagnosis Date  . Behcet's disease ([place])    "similiar to MS" (10/05/2017)  . Cellulitis 03/2016  . Chronic back pain    "all over"  . Chronic bronchitis ([place])   . Coronary artery disease    11/09/16 PCI with DES to mLAD, patent RCA stent  . DDD (degenerative disc disease), lumbar   . GERD (gastroesophageal reflux disease)   . Hyperlipidemia   . Hypertension   . LV dysfunction   . MI (myocardial infarction) ([place]) 10/2016  . Migraine    "quite a bit when I was younger; now maybe 1/year" (10/05/2017)  . MRSA carrier 2008  . MRSA infection 2008   BOTH LEGS  . Myocardial infarction involving right  coronary artery ([place]) 08/15/2016  . On home oxygen therapy    "3L prn" (10/05/2017)  . Osteoarthritis    "in all my major bones" (10/05/2017)  . Pneumonia    "several times" (10/05/2017)  . Pyoderma gangrenosum    "skin autoimmune disorder" (10/05/2017)  . Seasonal allergies   . Type II diabetes mellitus ([place])    type 2    Past Surgical History:  Procedure Laterality Date  . ANTERIOR CERVICAL DECOMP/DISCECTOMY FUSION  2008  . BILATERAL CARPAL TUNNEL RELEASE Bilateral 2007  . CORONARY CTO INTERVENTION  10/05/2017  . CORONARY CTO INTERVENTION N/A 10/05/2017   Procedure: CORONARY CTO INTERVENTION;  Surgeon: Jettie Booze, MD;  Location: [facility];  Service: Cardiovascular;  Laterality: N/A;  . CORONARY STENT INTERVENTION N/A 08/15/2016   Procedure: Coronary Stent Intervention;  Surgeon: Belva Crome, MD;  Location: [facility];  Service: Cardiovascular;  Laterality: N/A;  . CORONARY STENT INTERVENTION N/A 11/09/2016   Procedure: Coronary Stent Intervention;  Surgeon: Belva Crome, MD;  Location: [facility];  Service: Cardiovascular;  Laterality: N/A;  . ECTOPIC PREGNANCY SURGERY  X 3   "last time they took my tubes out"  . INCISION AND DRAINAGE     "had to clean MRSA off :RUE; 2 places on left inner leg; 1 place on  back side of my right leg" (10/05/2017)  . JOINT REPLACEMENT    . KNEE ARTHROSCOPY Right X 2  . LEFT HEART CATH AND CORONARY ANGIOGRAPHY N/A 08/15/2016   Procedure: Left Heart Cath and Coronary Angiography;  Surgeon: Belva Crome, MD;  Location: [facility];  Service: Cardiovascular;  Laterality: N/A;  . LEFT HEART CATH AND CORONARY ANGIOGRAPHY N/A 11/09/2016   Procedure: Left Heart Cath and Coronary Angiography;  Surgeon: Belva Crome, MD;  Location: [facility];  Service: Cardiovascular;  Laterality: N/A;  . LEFT HEART CATH AND CORONARY ANGIOGRAPHY N/A 04/19/2017   Procedure: LEFT HEART CATH AND CORONARY ANGIOGRAPHY;  Surgeon:  Sherren Mocha, MD;  Location: [facility];  Service: Cardiovascular;  Laterality: N/A;  . TONSILLECTOMY    . TOTAL KNEE ARTHROPLASTY Right   . ULTRASOUND GUIDANCE FOR VASCULAR ACCESS  11/09/2016   Procedure: Ultrasound Guidance For Vascular Access;  Surgeon: Belva Crome, MD;  Location: [place] [facility];  Service: Cardiovascular;;    Current Medications: Current Meds  Medication Sig  . carvedilol (COREG) 25 MG tablet Take 25 mg by mouth 2 (two) times daily with a meal.  . cetirizine (ZYRTEC) 10 MG tablet Take 10 mg by mouth daily.  . clobetasol ointment (TEMOVATE) 8.18 % Apply 1 application topically daily.  . clopidogrel (PLAVIX) 75 MG tablet TAKE ONE TABLET BY MOUTH DAILY  . dapsone 25 MG tablet Take 25 mg by mouth 2 (two) times daily.  Marland Kitchen Dexlansoprazole 30 MG capsule Take 1 capsule (30 mg total) by mouth daily.  Marland Kitchen glucose monitoring kit (FREESTYLE) monitoring kit 1 each by Does not apply route as needed for other. Dispense any model that is covered- dispense testing supplies for Q AC/ HS accuchecks- 1 month supply with one refil.  Marland Kitchen HUMIRA PEN-PS/UV/ADOL HS START 40 MG/0.8ML PNKT Take as directed.  . insulin aspart (NOVOLOG FLEXPEN) 100 UNIT/ML FlexPen Inject 3-20 Units into the skin 3 (three) times daily with meals. CBG 70 - 120: 0 units  CBG 121 - 150: 3 units  CBG 151 - 200: 4 units  CBG 201 - 250: 7 units  CBG 251 - 300: 11 units  CBG 301 - 350: 15 units  CBG 351 - 400: 20 units (Patient taking differently: Inject 3-20 Units into the skin 3 (three) times daily with meals. Per sliding scale)  . insulin detemir (LEVEMIR) 100 UNIT/ML injection Inject 0.3 mLs (30 Units total) into the skin at bedtime.  . isosorbide mononitrate (IMDUR) 60 MG 24 hr tablet Take 2 tablets (120 mg total) by mouth daily.  Marland Kitchen lisinopril (PRINIVIL,ZESTRIL) 20 MG tablet   . metFORMIN (GLUCOPHAGE) 850 MG tablet Take 850 mg by mouth 3 (three) times daily with meals.  Marland Kitchen morphine (MS CONTIN) 60 MG 12 hr  tablet Take 60 mg by mouth every 8 (eight) hours.   Marland Kitchen NARCAN 4 MG/0.1ML LIQD nasal spray kit Call EMS at first sign of opioid overdose. Inject spray into nostril. If no response in 2 minutes, inject opposite nostril.  Marland Kitchen neomycin-bacitracin-polymyxin (NEOSPORIN) ointment Apply 1 application topically as needed for wound care.  . nitroGLYCERIN (NITROSTAT) 0.4 MG SL tablet Place 1 tablet (0.4 mg total) under the tongue every 5 (five) minutes x 3 doses as needed for chest pain.  . Oxycodone HCl 20 MG TABS Take 20 mg by mouth every 4 (four) hours.   Marland Kitchen oxymetazoline (AFRIN) 0.05 % nasal spray Place 1 spray into both nostrils daily  as needed for congestion.  . rosuvastatin (CRESTOR) 20 MG tablet Take 20 mg by mouth daily.     Allergies:   Anaprox [naproxen sodium]; Aspirin; Brilinta [ticagrelor]; Ceftin [cefuroxime axetil]; Celexa [citalopram hydrobromide]; Ciprofloxacin; Coconut fragrance; Contrast media [iodinated diagnostic agents]; Cortisol [hydrocortisone]; Elavil [amitriptyline]; Hctz [hydrochlorothiazide]; Imitrex [sumatriptan]; Lexapro [escitalopram oxalate]; Methadone; Metrizamide; Motrin [ibuprofen]; Naprosyn [naproxen]; Neurontin [gabapentin]; Penicillins; Sulfa antibiotics; Sulfasalazine; Tagamet [cimetidine]; Toradol [ketorolac tromethamine]; Vancomycin; Zoloft [sertraline hcl]; Colcrys [colchicine]; Iodine; Lipitor [atorvastatin]; and Ranexa [ranolazine]   Social History   Socioeconomic History  . Marital status: Married    Spouse name: Not on file  . Number of children: Not on file  . Years of education: Not on file  . Highest education level: Not on file  Occupational History  . Not on file  Social Needs  . Financial resource strain: Not on file  . Food insecurity:    Worry: Not on file    Inability: Not on file  . Transportation needs:    Medical: Not on file    Non-medical: Not on file  Tobacco Use  . Smoking status: Former Smoker    Packs/day: 1.00    Years: 22.00    Pack  years: 22.00    Types: Cigarettes    Last attempt to quit: 08/17/2010    Years since quitting: 7.4  . Smokeless tobacco: Never Used  Substance and Sexual Activity  . Alcohol use: Not Currently    Alcohol/week: 0.0 standard drinks  . Drug use: Never  . Sexual activity: Not Currently  Lifestyle  . Physical activity:    Days per week: Not on file    Minutes per session: Not on file  . Stress: Not on file  Relationships  . Social connections:    Talks on phone: Not on file    Gets together: Not on file    Attends religious service: Not on file    Active member of club or organization: Not on file    Attends meetings of clubs or organizations: Not on file    Relationship status: Not on file  Other Topics Concern  . Not on file  Social History Narrative  . Not on file     Family History: The patient's She was adopted. Family history is unknown by patient.  ROS:   Please see the history of present illness.    She a chronic inflammatory state in the form of Behcet's.  She complains of chills, fatigue, sweating, leg pain, difficulty with walking, headaches, easy bruising, dizziness, muscle pain, back pain, along with shortness of breath and chest pain as outlined above.  She occasionally feels as though she develops fever.  All other systems reviewed and are negative.  EKGs/Labs/Other Studies Reviewed:    The following studies were reviewed today:  RCA CTO 10/05/2017: Failed.  All digital images were personally reviewed including April 2018, July 2018, January 2019, and June 2019.   Echocardiogram post attempted CTO:  Study Conclusions   - Left ventricle: The cavity size was normal. Wall thickness was   normal. Systolic function was normal. The estimated ejection   fraction was in the range of 55% to 60%. Basal inferior   hypokinesis. - Aortic valve: There was no stenosis. - Right ventricle: The cavity size was normal. Systolic function   was normal. - Pericardium,  extracardiac: There was no pericardial effusion.   Impressions:   - Limited echo for pericardial effusion.  EKG:  EKG is not ordered today.  Recent Labs: 10/02/2017: BUN 6; Creatinine, Ser 0.70; Hemoglobin 16.0; Platelets 174; Potassium 4.6; Sodium 137  Recent Lipid Panel    Component Value Date/Time   CHOL 125 11/07/2016 1124   TRIG 206 (H) 11/07/2016 1124   HDL 37 (L) 11/07/2016 1124   CHOLHDL 3.4 11/07/2016 1124   CHOLHDL 6.9 08/16/2016 0342   VLDL 24 08/16/2016 0342   LDLCALC 47 11/07/2016 1124    Physical Exam:    VS:  BP 124/82   Pulse 87   Ht '5\' 9"'$  (1.753 m)   Wt 287 lb 12.8 oz (130.5 kg)   LMP  (LMP Unknown)   BMI 42.50 kg/m     Wt Readings from Last 3 Encounters:  01/25/18 287 lb 12.8 oz (130.5 kg)  10/17/17 278 lb 6.4 oz (126.3 kg)  10/06/17 280 lb 13.9 oz (127.4 kg)     GEN: Obese.  Well nourished, well developed in no acute distress HEENT: Normal NECK: No JVD. LYMPHATICS: No lymphadenopathy CARDIAC: RRR, no murmur, no gallop, no edema. VASCULAR: Absent right radial 2+ left radial pulse.  No bruits. RESPIRATORY:  Clear to auscultation without rales, wheezing or rhonchi  ABDOMEN: Soft, non-tender, non-distended, No pulsatile mass, MUSCULOSKELETAL: No deformity  SKIN: Warm and dry NEUROLOGIC:  Alert and oriented x 3 PSYCHIATRIC:  Normal affect   ASSESSMENT:    1. Coronary artery disease involving native coronary artery of native heart with angina pectoris ([place])   2. Mixed hyperlipidemia   3. Essential hypertension   4. Morbid obesity ([place])   5. Diabetes mellitus type 2 in obese ([place])   6. Behcet's disease ([place])    PLAN:    In order of problems listed above:  1. She has class III symptoms due to RCA occlusion and assuming that her symptoms and limitations all related to ischemia.  Symptoms are nitroglycerin responsive and precipitated by physical activity.  The RCA is a large vessel.  Attempted CTO recanalization failed.  Maximized medical  therapy has not brought about significant improvement in symptoms.  Therefore, after talking with the patient she desires to be considered for surgical revascularization if possible.  We will refer her for a surgical opinion. 2. LDL is at target. 3. Blood pressure is at target. 4. She is losing weight. 5. A1c is 6.0.  She is on maximal medical therapy and is having unacceptable symptoms.  She has single-vessel CAD.  Referred to [facility] for consideration of surgical revascularization.  We have been hopeful that time, exercise, and medication would improve collateral flow and bring about symptom improvement.  She is unhappy with her current quality of life and wishes to proceed with a more definitive treatment for control   Medication Adjustments/Labs and Tests Ordered: Current medicines are reviewed at length with the patient today.  Concerns regarding medicines are outlined above.  Orders Placed This Encounter  Procedures  . Ambulatory referral to Cardiothoracic Surgery   No orders of the defined types were placed in this encounter.   Patient Instructions  Medication Instructions:  Your physician recommends that you continue on your current medications as directed. Please refer to the Current Medication list given to you today.  If you need a refill on your cardiac medications before your next appointment, please call your pharmacy.   Lab work: None If you have labs (blood work) drawn today and your tests are completely normal, you will receive your results only by: Marland Kitchen MyChart Message (if you have MyChart) OR . A paper copy  in the mail If you have any lab test that is abnormal or we need to change your treatment, we will call you to review the results.  Testing/Procedures: None  Follow-Up: At [facility], you and your health needs are our priority.  As part of our continuing mission to provide you with exceptional heart care, we have created designated Provider Care Teams.  These  Care Teams include your primary Cardiologist (physician) and Advanced Practice Providers (APPs -  Physician Assistants and Nurse Practitioners) who all work together to provide you with the care you need, when you need it. You will need a follow up appointment in 6 months.  Please call our office 2 months in advance to schedule this appointment.  You may see Sinclair Grooms, MD or one of the following Advanced Practice Providers on your designated Care Team:   Truitt Merle, NP Cecilie Kicks, NP . Kathyrn Drown, NP  Any Other Special Instructions Will Be Listed Below (If Applicable).  You have been referred to [facility], Dr. Servando Snare.       Signed, Sinclair Grooms, MD  01/25/2018 5:34 PM    [place]

## 2018-01-25 NOTE — Patient Instructions (Signed)
Medication Instructions:  Your physician recommends that you continue on your current medications as directed. Please refer to the Current Medication list given to you today.  If you need a refill on your cardiac medications before your next appointment, please call your pharmacy.   Lab work: None If you have labs (blood work) drawn today and your tests are completely normal, you will receive your results only by: Marland Kitchen MyChart Message (if you have MyChart) OR . A paper copy in the mail If you have any lab test that is abnormal or we need to change your treatment, we will call you to review the results.  Testing/Procedures: None  Follow-Up: At [facility], you and your health needs are our priority.  As part of our continuing mission to provide you with exceptional heart care, we have created designated Provider Care Teams.  These Care Teams include your primary Cardiologist (physician) and Advanced Practice Providers (APPs -  Physician Assistants and Nurse Practitioners) who all work together to provide you with the care you need, when you need it. You will need a follow up appointment in 6 months.  Please call our office 2 months in advance to schedule this appointment.  You may see Lesleigh Noe, MD or one of the following Advanced Practice Providers on your designated Care Team:   Norma Fredrickson, NP Nada Boozer, NP . Georgie Chard, NP  Any Other Special Instructions Will Be Listed Below (If Applicable).  You have been referred to [facility], Dr. Tyrone Sage.

## 2018-02-12 DIAGNOSIS — M1990 Unspecified osteoarthritis, unspecified site: Secondary | ICD-10-CM | POA: Diagnosis not present

## 2018-02-12 DIAGNOSIS — Z5181 Encounter for therapeutic drug level monitoring: Secondary | ICD-10-CM | POA: Diagnosis not present

## 2018-02-12 DIAGNOSIS — M5030 Other cervical disc degeneration, unspecified cervical region: Secondary | ICD-10-CM | POA: Diagnosis not present

## 2018-02-12 DIAGNOSIS — G894 Chronic pain syndrome: Secondary | ICD-10-CM | POA: Diagnosis not present

## 2018-02-13 ENCOUNTER — Other Ambulatory Visit: Payer: Self-pay | Admitting: Medical

## 2018-02-13 NOTE — Telephone Encounter (Signed)
Needs to come from PCP

## 2018-02-13 NOTE — Telephone Encounter (Signed)
Forwarding to you :)

## 2018-02-13 NOTE — Telephone Encounter (Signed)
Pt's pharmacy is requesting a refill on dexlansoprazole that was prescribed in the hospital. Would Dr. Katrinka Blazing like to refill this medication? Please address

## 2018-02-15 ENCOUNTER — Encounter: Payer: Medicare Other | Admitting: Cardiothoracic Surgery

## 2018-02-22 ENCOUNTER — Encounter: Payer: Self-pay | Admitting: Cardiothoracic Surgery

## 2018-02-22 ENCOUNTER — Institutional Professional Consult (permissible substitution) (INDEPENDENT_AMBULATORY_CARE_PROVIDER_SITE_OTHER): Payer: Medicare Other | Admitting: Cardiothoracic Surgery

## 2018-02-22 ENCOUNTER — Other Ambulatory Visit: Payer: Self-pay

## 2018-02-22 VITALS — BP 115/81 | HR 91 | Resp 18 | Ht 69.0 in | Wt 255.0 lb

## 2018-02-22 DIAGNOSIS — G894 Chronic pain syndrome: Secondary | ICD-10-CM | POA: Diagnosis not present

## 2018-02-22 DIAGNOSIS — I25119 Atherosclerotic heart disease of native coronary artery with unspecified angina pectoris: Secondary | ICD-10-CM | POA: Diagnosis not present

## 2018-02-22 NOTE — Progress Notes (Signed)
[address]       [place] [address]             [place] Record #[id number] Date of Birth: [birth year]  Referring: Belva Crome, MD Primary Care: Ronita Hipps, MD Primary Cardiologist: Sinclair Grooms, MD  Chief Complaint:    Chief Complaint  Patient presents with  . Coronary Artery Disease    Surgical eval, Cardiac Cath and ECHO 10/05/2017    History of Present Illness:    Brittney Banks 47 y.o. female is seen in the office  today for evaluation of possible coronary artery bypass grafting.  The patient was initially presented with an inferior STEMI 08/15/2016. .  This was treated with primary PCI of the right coronary artery.  She was noted to have diffuse LAD disease with mid vessel stenosis treated in July 2018 with a drug-eluting stent.  At that time the right coronary artery was widely patent.  In January 2019 she returned with recurrent angina in the right coronary artery was noted to be occluded.  Attempt in June 2019 to open this was unsuccessful.  The last cardiac cath is October 05, 2017  Patient is recently been diagnosed with Bechets disease, pyoderma granulosa.  She has been on home oxygen at times but is not on oxygen on her visit office today.  She notes that she has been having chest discomfort there is now independent of activity, at times waking her at night.  It does not seem to be worsened by exercise.  She does note that because of shortness of breath with exertion she has been less active recently.  Current Activity/ Functional Status:  Patient is independent with mobility/ambulation, transfers, ADL's, IADL's.   Zubrod Score: At the time of surgery this patient's most appropriate activity status/level should be described as: '[]'$     0    Normal activity, no symptoms '[x]'$     1    Restricted in physical strenuous activity but ambulatory, able to do out light work '[]'$     2    Ambulatory  and capable of self care, unable to do work activities, up and about               >50 % of waking hours                              '[]'$     3    Only limited self care, in bed greater than 50% of waking hours '[]'$     4    Completely disabled, no self care, confined to bed or chair '[]'$     5    Moribund   Past Medical History:  Diagnosis Date  . Behcet's disease ([place])    "similiar to MS" (10/05/2017)  . Cellulitis 03/2016  . Chronic back pain    "all over"  . Chronic bronchitis ([place])   . Coronary artery disease    11/09/16 PCI with DES to mLAD, patent RCA stent  . DDD (degenerative disc disease), lumbar   . GERD (gastroesophageal reflux disease)   . Hyperlipidemia   . Hypertension   . LV dysfunction   . MI (myocardial infarction) ([place]) 10/2016  . Migraine    "quite a bit when I was  younger; now maybe 1/year" (10/05/2017)  . MRSA carrier 2008  . MRSA infection 2008   BOTH LEGS  . Myocardial infarction involving right coronary artery ([place]) 08/15/2016  . On home oxygen therapy    "3L prn" (10/05/2017)  . Osteoarthritis    "in all my major bones" (10/05/2017)  . Pneumonia    "several times" (10/05/2017)  . Pyoderma gangrenosum    "skin autoimmune disorder" (10/05/2017)  . Seasonal allergies   . Type II diabetes mellitus ([place])    type 2    Past Surgical History:  Procedure Laterality Date  . ANTERIOR CERVICAL DECOMP/DISCECTOMY FUSION  2008  . BILATERAL CARPAL TUNNEL RELEASE Bilateral 2007  . CORONARY CTO INTERVENTION  10/05/2017  . CORONARY CTO INTERVENTION N/A 10/05/2017   Procedure: CORONARY CTO INTERVENTION;  Surgeon: Jettie Booze, MD;  Location: [facility];  Service: Cardiovascular;  Laterality: N/A;  . CORONARY STENT INTERVENTION N/A 08/15/2016   Procedure: Coronary Stent Intervention;  Surgeon: Belva Crome, MD;  Location: [facility];  Service: Cardiovascular;  Laterality: N/A;  . CORONARY STENT INTERVENTION N/A 11/09/2016   Procedure: Coronary Stent  Intervention;  Surgeon: Belva Crome, MD;  Location: [facility];  Service: Cardiovascular;  Laterality: N/A;  . ECTOPIC PREGNANCY SURGERY  X 3   "last time they took my tubes out"  . INCISION AND DRAINAGE     "had to clean MRSA off :RUE; 2 places on left inner leg; 1 place on back side of my right leg" (10/05/2017)  . JOINT REPLACEMENT    . KNEE ARTHROSCOPY Right X 2  . LEFT HEART CATH AND CORONARY ANGIOGRAPHY N/A 08/15/2016   Procedure: Left Heart Cath and Coronary Angiography;  Surgeon: Belva Crome, MD;  Location: [facility];  Service: Cardiovascular;  Laterality: N/A;  . LEFT HEART CATH AND CORONARY ANGIOGRAPHY N/A 11/09/2016   Procedure: Left Heart Cath and Coronary Angiography;  Surgeon: Belva Crome, MD;  Location: [facility];  Service: Cardiovascular;  Laterality: N/A;  . LEFT HEART CATH AND CORONARY ANGIOGRAPHY N/A 04/19/2017   Procedure: LEFT HEART CATH AND CORONARY ANGIOGRAPHY;  Surgeon: Sherren Mocha, MD;  Location: [facility];  Service: Cardiovascular;  Laterality: N/A;  . TONSILLECTOMY    . TOTAL KNEE ARTHROPLASTY Right   . ULTRASOUND GUIDANCE FOR VASCULAR ACCESS  11/09/2016   Procedure: Ultrasound Guidance For Vascular Access;  Surgeon: Belva Crome, MD;  Location: [facility];  Service: Cardiovascular;;    Family History  Adopted: Yes  Family history unknown: Yes     Social History   Tobacco Use  Smoking Status Former Smoker  . Packs/day: 1.00  . Years: 22.00  . Pack years: 22.00  . Types: Cigarettes  . Last attempt to quit: 08/17/2010  . Years since quitting: 7.5  Smokeless Tobacco Never Used    Social History   Substance and Sexual Activity  Alcohol Use Not Currently  . Alcohol/week: 0.0 standard drinks     Allergies  Allergen Reactions  . Anaprox [Naproxen Sodium] Shortness Of Breath and Other (See Comments)    Redness of skin and difficulty breathing   . Aspirin Nausea And Vomiting and Other (See Comments)      Severe cramping, vomiting   . Brilinta [Ticagrelor] Shortness Of Breath  . Ceftin [Cefuroxime Axetil] Shortness Of Breath and Other (See Comments)    Skin turns red   . Celexa [Citalopram Hydrobromide] Shortness Of Breath  .  Ciprofloxacin Shortness Of Breath and Other (See Comments)    Skin turns red   . Coconut Fragrance Hives, Shortness Of Breath, Swelling and Other (See Comments)    Reaction to any kind of coconut Mouth and tongue swelling  . Contrast Media [Iodinated Diagnostic Agents] Shortness Of Breath    Breathing difficulty  . Cortisol [Hydrocortisone] Shortness Of Breath    Breathing difficulty and redness of skin  . Elavil [Amitriptyline] Shortness Of Breath     Redness of skin and blisters of skin Difficulty breathing  . Hctz [Hydrochlorothiazide] Shortness Of Breath  . Imitrex [Sumatriptan] Shortness Of Breath    Redness and difficulty breathing   . Lexapro [Escitalopram Oxalate] Shortness Of Breath    Breathing difficulty Redness to skin  . Methadone Nausea And Vomiting    Severely vomiting   . Metrizamide Shortness Of Breath    Breathing difficulty  . Motrin [Ibuprofen] Nausea And Vomiting  . Naprosyn [Naproxen] Shortness Of Breath    Breathing difficulty Redness of skin  . Neurontin [Gabapentin] Shortness Of Breath    Breathing difficulty  Redness to skin Breathing difficulty  Redness to skin  . Penicillins Shortness Of Breath and Rash    Has patient had a PCN reaction causing immediate rash, facial/tongue/throat swelling, SOB or lightheadedness with hypotension: Yes Has patient had a PCN reaction causing severe rash involving mucus membranes or skin necrosis: Yes Has patient had a PCN reaction that required hospitalization: Yes Has patient had a PCN reaction occurring within the last 10 years: No If all of the above answers are "NO", then may proceed with Cephalosporin use.   . Sulfa Antibiotics Shortness Of Breath    Severe breathing  difficulty And redness of skin  . Sulfasalazine Shortness Of Breath    Severe breathing difficulty And redness of skin  . Tagamet [Cimetidine] Nausea And Vomiting  . Toradol [Ketorolac Tromethamine] Shortness Of Breath    Redness to skin  . Vancomycin Rash    Patient Confirms Red mans syndome type rxn - Tolerated a slow infusion in past  . Zoloft [Sertraline Hcl] Shortness Of Breath and Other (See Comments)    Breathing difficulty Redness to skin  . Colcrys [Colchicine] Nausea And Vomiting  . Iodine Swelling and Other (See Comments)    Redness to skin  . Lipitor [Atorvastatin] Diarrhea  . Ranexa [Ranolazine] Other (See Comments)    "Made symptoms (heart racing and pain) worse"    Current Outpatient Medications  Medication Sig Dispense Refill  . carvedilol (COREG) 25 MG tablet Take 25 mg by mouth 2 (two) times daily with a meal.    . cetirizine (ZYRTEC) 10 MG tablet Take 10 mg by mouth daily.    . clobetasol ointment (TEMOVATE) 8.46 % Apply 1 application topically daily.  5  . clopidogrel (PLAVIX) 75 MG tablet TAKE ONE TABLET BY MOUTH DAILY 30 tablet 6  . dapsone 25 MG tablet Take 25 mg by mouth 2 (two) times daily.  0  . Dexlansoprazole 30 MG capsule Take 1 capsule (30 mg total) by mouth daily. 30 capsule 3  . glucose monitoring kit (FREESTYLE) monitoring kit 1 each by Does not apply route as needed for other. Dispense any model that is covered- dispense testing supplies for Q AC/ HS accuchecks- 1 month supply with one refil. 1 each 1  . HUMIRA PEN-PS/UV/ADOL HS START 40 MG/0.8ML PNKT Take as directed.    . insulin aspart (NOVOLOG FLEXPEN) 100 UNIT/ML FlexPen Inject 3-20 Units into the  skin 3 (three) times daily with meals. CBG 70 - 120: 0 units  CBG 121 - 150: 3 units  CBG 151 - 200: 4 units  CBG 201 - 250: 7 units  CBG 251 - 300: 11 units  CBG 301 - 350: 15 units  CBG 351 - 400: 20 units (Patient taking differently: Inject 3-20 Units into the skin 3 (three) times daily with  meals. Per sliding scale) 15 mL 11  . insulin detemir (LEVEMIR) 100 UNIT/ML injection Inject 0.3 mLs (30 Units total) into the skin at bedtime. 10 mL 11  . isosorbide mononitrate (IMDUR) 60 MG 24 hr tablet Take 2 tablets (120 mg total) by mouth daily. 180 tablet 3  . lisinopril (PRINIVIL,ZESTRIL) 20 MG tablet     . metFORMIN (GLUCOPHAGE) 850 MG tablet Take 850 mg by mouth 3 (three) times daily with meals.    Marland Kitchen morphine (MS CONTIN) 60 MG 12 hr tablet Take 60 mg by mouth every 8 (eight) hours.     Marland Kitchen NARCAN 4 MG/0.1ML LIQD nasal spray kit Call EMS at first sign of opioid overdose. Inject spray into nostril. If no response in 2 minutes, inject opposite nostril.  0  . neomycin-bacitracin-polymyxin (NEOSPORIN) ointment Apply 1 application topically as needed for wound care.    . nitroGLYCERIN (NITROSTAT) 0.4 MG SL tablet Place 1 tablet (0.4 mg total) under the tongue every 5 (five) minutes x 3 doses as needed for chest pain. 25 tablet 6  . Oxycodone HCl 20 MG TABS Take 20 mg by mouth every 4 (four) hours.     Marland Kitchen oxymetazoline (AFRIN) 0.05 % nasal spray Place 1 spray into both nostrils daily as needed for congestion.    . rosuvastatin (CRESTOR) 20 MG tablet Take 20 mg by mouth daily.     No current facility-administered medications for this visit.     Pertinent items are noted in HPI.   Review of Systems:     Cardiac Review of Systems: [Y] = yes  or   [ N ] = no   Chest Pain [   y ]  Resting SOB [ n  ] Exertional SOB  [ y ]  Orthopnea Blue.Reese  ]   Pedal Edema [  y ]    Palpitations [n  ] Syncope  Florencio.Farrier  ]   Presyncope [n   ]   General Review of Systems: [Y] = yes [  ]=no Constitional: recent weight change [  ];  Wt loss over the last 3 months [   ] anorexia [  ]; fatigue [  ]; nausea [  ]; night sweats [  ]; fever [  ]; or chills [  ];           Eye : blurred vision [  ]; diplopia [   ]; vision changes [  ];  Amaurosis fugax[  ]; Resp: cough [  ];  wheezing[  ];  hemoptysis[  ]; shortness of breath[  ];  paroxysmal nocturnal dyspnea[  ]; dyspnea on exertion[  ]; or orthopnea[  ];  GI:  gallstones[  ], vomiting[  ];  dysphagia[  ]; melena[  ];  hematochezia [  ]; heartburn[  ];   Hx of  Colonoscopy[  ]; GU: kidney stones [  ]; hematuria[  ];   dysuria [  ];  nocturia[  ];  history of     obstruction [  ]; urinary frequency [  ]  Skin: rash, swelling[ y ];, hair loss[ y ];  peripheral edema[ y ];  or itching[ y ]; Musculosketetal: myalgias[  ];  joint swelling[  ];  joint erythema[  ];  joint pain[  ];  back pain[  ];  Heme/Lymph: bruising[  ];  bleeding[  ];  anemia[  ];  Neuro: TIA[  ];  headaches[  ];  stroke[  ];  vertigo[  ];  seizures[  ];   paresthesias[  ];  difficulty walking[  ];  Psych:depression[  ]; anxiety[  ];  Endocrine: diabetes[  y];  thyroid dysfunction[  ];  Immunizations: Flu up to date [  ]; Pneumococcal up to date [  ];  Other: Extensive sores in various degrees of healing involving the left and right legs and arms     PHYSICAL EXAMINATION: BP 115/81 (BP Location: Right Arm, Patient Position: Sitting, Cuff Size: Large)   Pulse 91   Resp 18   Ht '5\' 9"'$  (1.753 m)   Wt 255 lb (115.7 kg)   LMP  (LMP Unknown)   SpO2 96% Comment: RA  BMI 37.66 kg/m  General appearance: alert, cooperative, appears older than stated age, no distress and moderately obese Head: Normocephalic, without obvious abnormality, atraumatic Neck: no adenopathy, no carotid bruit, no JVD, supple, symmetrical, trachea midline and thyroid not enlarged, symmetric, no tenderness/mass/nodules Lymph nodes: Cervical, supraclavicular, and axillary nodes normal. Resp: clear to auscultation bilaterally Back: symmetric, no curvature. ROM normal. No CVA tenderness. Cardio: regular rate and rhythm, S1, S2 normal, no murmur, click, rub or gallop GI: soft, non-tender; bowel sounds normal; no masses,  no organomegaly Extremities: varicose veins noted, venous stasis dermatitis noted and Patient has  significant ulceration especially of the left lower leg but also the right and ulcerations of her arms Neurologic: Grossly normal  Diagnostic Studies & Laboratory data:     Recent Radiology Findings:   No results found.   I have independently reviewed the above radiology studies  and reviewed the findings with the patient.   Recent Lab Findings: Lab Results  Component Value Date   WBC 9.4 10/02/2017   HGB 16.0 (H) 10/02/2017   HCT 47.3 (H) 10/02/2017   PLT 174 10/02/2017   GLUCOSE 399 (H) 10/02/2017   CHOL 125 11/07/2016   TRIG 206 (H) 11/07/2016   HDL 37 (L) 11/07/2016   LDLCALC 47 11/07/2016   ALT 42 (H) 11/07/2016   AST 20 11/07/2016   NA 137 10/02/2017   K 4.6 10/02/2017   CL 99 10/02/2017   CREATININE 0.70 10/02/2017   BUN 6 10/02/2017   CO2 24 10/02/2017   TSH 0.504 08/15/2016   INR 1.07 04/19/2017   HGBA1C 10.8 (H) 08/15/2016   Cath: 10/05/2017  Mid RCA to Dist RCA lesion is 100% stenosed.  Mid RCA lesion is 100% stenosed. Balloon angioplasty was performed.  Post intervention, there is a 100% residual stenosis.  Balloon angioplasty was performed.   Unable to open CTO.  Continue medical therapy.    CATH: 04/19/2017 Total occlusion of the mid RCA proximal to the stented segment, with left-to-right collaterals identified 2.  Patency of the mid LAD stent with mild to moderate diffuse LAD stenosis especially involving the distal vessel 3.  Patent left circumflex/intermediate without significant stenosis 4.  Mild to moderate segmental LV systolic dysfunction with LVEF estimated at 45-50% and moderately elevated LVEDP  Recommendation: Options include ongoing medical therapy versus CTO-PCI.  Will arrange a follow-up visit with Dr. Tamala Julian so  she can review options further.   Most Recent Value  AO Systolic Pressure 409 mmHg  AO Diastolic Pressure 79 mmHg  AO Mean 811 mmHg  LV Systolic Pressure 914 mmHg  LV Diastolic Pressure 11 mmHg  LV EDP 25 mmHg  AOp  Systolic Pressure 782 mmHg  AOp Diastolic Pressure 74 mmHg  AOp Mean Pressure 97 mmHg  LVp Systolic Pressure 956 mmHg  LVp Diastolic Pressure 10 mmHg  LVp EDP Pressure 26 mmHg    Acute inferior ST elevation myocardial infarction commencing approximately 18 hours prior to admission. Ongoing significant chest discomfort with ST elevation noted on EKG.  Total occlusion in the mid RCA of what is a dominant vessel that supplies the entire inferior wall and apical LV.  80-90% diffuse disease in the mid LAD which is a relatively small vessel.  Luminal irregularities noted in a large diagonal of the LAD.  Ostial to proximal 80% stenosis in the circumflex which is a relatively small vessel.  Irregularities within the ramus intermedius but no high-grade obstruction is noted. Less than 50% obstruction.  Successful PTCA and stent of the mid right coronary from 100% to 0% using an Onyx 4.5 x 22 mm drug-eluting stent postdilated with a 4.5 x 15 mm balloon. Catheter based thrombectomy was applied prior to stenting. TIMI grade 3 flow noted post procedure. Very distal PDA and left ventricular branch embolic obstruction.  The patient is allergic to aspirin.  Recommendations:  Brilinta times at least 6 months before switching to Plavix to complete at least one year, but I would recommend indefinite since the patient is unable to use aspirin.  IV Aggrastat 18 hours.  Beta blocker therapy.  High intensity statin therapy.  Consider staged intervention on the mid LAD diffuse disease. Team should review pictures to determine if this should be done prior to discharge.    Patient:    Brittanni, Cariker MR #:       [id number] Study Date: 10/05/2017 Gender:     F Age:        6 Height:     175.3 cm Weight:     120 kg BSA:        2.47 m^2 Pt. Status: Room:       [place], MD  [place], [place], [place], Inpatient  SONOGRAPHER  Cardell Peach, RDCS  cc:  ------------------------------------------------------------------- LV EF: 55% -   60%  ------------------------------------------------------------------- Indications:      Pericardial effusion 423.9.  ------------------------------------------------------------------- History:   PMH:   Coronary artery disease.  Risk factors: Hypertension. Diabetes mellitus.  ------------------------------------------------------------------- Study Conclusions  - Left ventricle: The cavity size was normal. Wall thickness was   normal. Systolic function was normal. The estimated ejection   fraction was in the range of 55% to 60%. Basal inferior   hypokinesis. - Aortic valve: There was no stenosis. - Right ventricle: The cavity size was normal. Systolic function   was normal. - Pericardium, extracardiac: There was no pericardial effusion.  Impressions:  - Limited echo for pericardial effusion.  ------------------------------------------------------------------- Study data:  Comparison was made to the study of 08/17/2016.  Study status:  STAT.  Procedure:  Transthoracic echocardiography. Image quality was adequate.  Study completion:  There were no complications.          Transthoracic echocardiography.  M-mode, complete 2D, spectral Doppler, and color Doppler.  Birthdate: Patient birthdate: 1970-09-22.  Age:  Patient is 47 yr old.  Sex: Gender: female.    BMI: 39.1 kg/m^2.  Blood pressure:     134/83 Patient status:  Inpatient.  Study date:  Study date: 10/05/2017. Study time: 04:48 PM.  Location:  Catheterization laboratory.  -------------------------------------------------------------------  ------------------------------------------------------------------- Left ventricle:  The cavity size was normal. Wall thickness was normal. Systolic function was normal. The estimated ejection fraction  was in the range of 55% to 60%. Basal inferior hypokinesis.  ------------------------------------------------------------------- Aortic valve:   Doppler:   There was no stenosis.   There was no regurgitation.  ------------------------------------------------------------------- Left atrium:  The atrium was normal in size.  ------------------------------------------------------------------- Right ventricle:  The cavity size was normal. Systolic function was normal.  ------------------------------------------------------------------- Right atrium:  The atrium was normal in size.  ------------------------------------------------------------------- Pericardium:  There was no pericardial effusion.   ------------------------------------------------------------------- Prepared and Electronically Authenticated by  Loralie Champagne, M.D. 2019-06-20T17:47:20   Assessment / Plan:   Patient creates a significant treatment dilemma, she probably has angina that has worsened whether this is because of the chronically occluded right coronary artery or other small vessel disease.  On review of her series of cath films, the right was a large vessel that has collateral flow now the posterior lateral branch may have been jeopardized with her previous MI.  The LAD which was stented there is a small vessel and probably not bypassable.  I would be reluctant to proceed with coronary artery bypass grafting, only for relief of anginal symptoms in a patient that would be at high risk for complications after surgery   Could consider ischemic functional study, to confirm its ischemia of the inferior wall causing her symptoms.  With the rapidly changing nature of her coronary disease coronary artery bypass grafting based on 72-monthold films could potentially be a significant problem.  After seeing the patient and reviewing the films I will review the case with Dr. STamala Julianbefore any decision is made.      I   spent 60 minutes with  the patient face to face and greater then 50% of the time was spent in counseling and coordination of care.    EGrace IsaacMD      [address] [place] [address]Office [phone]   Beeper [phone] 02/22/2018 11:39 AM

## 2018-03-21 DIAGNOSIS — G894 Chronic pain syndrome: Secondary | ICD-10-CM | POA: Diagnosis not present

## 2018-03-21 DIAGNOSIS — M1990 Unspecified osteoarthritis, unspecified site: Secondary | ICD-10-CM | POA: Diagnosis not present

## 2018-03-21 DIAGNOSIS — Z5181 Encounter for therapeutic drug level monitoring: Secondary | ICD-10-CM | POA: Diagnosis not present

## 2018-03-21 DIAGNOSIS — M5030 Other cervical disc degeneration, unspecified cervical region: Secondary | ICD-10-CM | POA: Diagnosis not present

## 2018-04-02 ENCOUNTER — Other Ambulatory Visit: Payer: Self-pay | Admitting: Cardiology

## 2018-04-05 DIAGNOSIS — L88 Pyoderma gangrenosum: Secondary | ICD-10-CM | POA: Diagnosis not present

## 2018-04-19 DIAGNOSIS — G894 Chronic pain syndrome: Secondary | ICD-10-CM | POA: Diagnosis not present

## 2018-04-19 DIAGNOSIS — Z5181 Encounter for therapeutic drug level monitoring: Secondary | ICD-10-CM | POA: Diagnosis not present

## 2018-04-19 DIAGNOSIS — M5030 Other cervical disc degeneration, unspecified cervical region: Secondary | ICD-10-CM | POA: Diagnosis not present

## 2018-04-19 DIAGNOSIS — M1990 Unspecified osteoarthritis, unspecified site: Secondary | ICD-10-CM | POA: Diagnosis not present

## 2018-05-16 ENCOUNTER — Ambulatory Visit: Payer: Medicare Other | Admitting: Interventional Cardiology

## 2018-05-16 DIAGNOSIS — M5030 Other cervical disc degeneration, unspecified cervical region: Secondary | ICD-10-CM | POA: Diagnosis not present

## 2018-05-16 DIAGNOSIS — G894 Chronic pain syndrome: Secondary | ICD-10-CM | POA: Diagnosis not present

## 2018-05-16 DIAGNOSIS — M1990 Unspecified osteoarthritis, unspecified site: Secondary | ICD-10-CM | POA: Diagnosis not present

## 2018-05-16 DIAGNOSIS — Z5181 Encounter for therapeutic drug level monitoring: Secondary | ICD-10-CM | POA: Diagnosis not present

## 2018-05-28 NOTE — Progress Notes (Signed)
Cardiology Office Note:    Date:  05/29/2018   ID:  Brittney Banks, DOB 30-Aug-1970, MRN [id number]  PCP:  Ronita Hipps, MD  Cardiologist:  Sinclair Grooms, MD   Referring MD: Ronita Hipps, MD   Chief Complaint  Patient presents with  . Coronary Artery Disease    History of Present Illness:    Brittney Banks is a 48 y.o. female with a history of hypertension, prior smoker, diabetes mellitus type 2, hyperlipidemia,, hirsutism, Behcet's disease, known CAD with inferior STEMI treated with Onyx DES April 2018, recurring symptoms led to 2.25 Onyx DES of high-grade small LAD July 2018, subsequent recurrent pain January 2019 demonstrating total occlusion of the RCA with development of collaterals.  Failed CTO attempt on the right coronary in July 2019.Marland KitchenFelt to be non-ideal forr CABG due to questionable targets and the source of pain.  She has Pyoderma Gangrenosum and Behcet's.  Coronary disease natural history has been very aggressive raising the question of possible arteritis superimposed on typical coronary disease for which she has multiple risk factors (diabetes, heavy tobacco, family history, obesity, physical inactivity smoking history).  Right coronary artery had in-stent restenosis rapidly after deployment and June 2018.  Other risk factors were poorly controlled including smoking.  Still has exertional discomfort.  Is able to walk further before discomfort becomes difficult to control.  Nitroglycerin does help the severe chest discomfort.  Dose Imdur.  Dr. Melina Copa is not considering Humira.  Most recent LV assessment in June revealed normal systolic function.   Past Medical History:  Diagnosis Date  . Behcet's disease ([place])    "similiar to MS" (10/05/2017)  . Cellulitis 03/2016  . Chronic back pain    "all over"  . Chronic bronchitis ([place])   . Coronary artery disease    11/09/16 PCI with DES to mLAD, patent RCA stent  . DDD (degenerative disc disease), lumbar   . GERD  (gastroesophageal reflux disease)   . Hyperlipidemia   . Hypertension   . LV dysfunction   . MI (myocardial infarction) ([name]) 10/2016  . Migraine    "quite a bit when I was younger; now maybe 1/year" (10/05/2017)  . MRSA carrier 2008  . MRSA infection 2008   BOTH LEGS  . Myocardial infarction involving right coronary artery ([name]) 08/15/2016  . On home oxygen therapy    "3L prn" (10/05/2017)  . Osteoarthritis    "in all my major bones" (10/05/2017)  . Pneumonia    "several times" (10/05/2017)  . Pyoderma gangrenosum    "skin autoimmune disorder" (10/05/2017)  . Seasonal allergies   . Type II diabetes mellitus ([name])    type 2    Past Surgical History:  Procedure Laterality Date  . ANTERIOR CERVICAL DECOMP/DISCECTOMY FUSION  2008  . BILATERAL CARPAL TUNNEL RELEASE Bilateral 2007  . CORONARY CTO INTERVENTION  10/05/2017  . CORONARY CTO INTERVENTION N/A 10/05/2017   Procedure: CORONARY CTO INTERVENTION;  Surgeon: Jettie Booze, MD;  Location: [facility];  Service: Cardiovascular;  Laterality: N/A;  . CORONARY STENT INTERVENTION N/A 08/15/2016   Procedure: Coronary Stent Intervention;  Surgeon: Belva Crome, MD;  Location: [facility];  Service: Cardiovascular;  Laterality: N/A;  . CORONARY STENT INTERVENTION N/A 11/09/2016   Procedure: Coronary Stent Intervention;  Surgeon: Belva Crome, MD;  Location: [facility];  Service: Cardiovascular;  Laterality: N/A;  . ECTOPIC PREGNANCY SURGERY  X 3   "last time they took  my tubes out"  . INCISION AND DRAINAGE     "had to clean MRSA off :RUE; 2 places on left inner leg; 1 place on back side of my right leg" (10/05/2017)  . JOINT REPLACEMENT    . KNEE ARTHROSCOPY Right X 2  . LEFT HEART CATH AND CORONARY ANGIOGRAPHY N/A 08/15/2016   Procedure: Left Heart Cath and Coronary Angiography;  Surgeon: Belva Crome, MD;  Location: [facility];  Service: Cardiovascular;  Laterality: N/A;  . LEFT HEART CATH AND  CORONARY ANGIOGRAPHY N/A 11/09/2016   Procedure: Left Heart Cath and Coronary Angiography;  Surgeon: Belva Crome, MD;  Location: [facility];  Service: Cardiovascular;  Laterality: N/A;  . LEFT HEART CATH AND CORONARY ANGIOGRAPHY N/A 04/19/2017   Procedure: LEFT HEART CATH AND CORONARY ANGIOGRAPHY;  Surgeon: Sherren Mocha, MD;  Location: [name];  Service: Cardiovascular;  Laterality: N/A;  . TONSILLECTOMY    . TOTAL KNEE ARTHROPLASTY Right   . ULTRASOUND GUIDANCE FOR VASCULAR ACCESS  11/09/2016   Procedure: Ultrasound Guidance For Vascular Access;  Surgeon: Belva Crome, MD;  Location: [name];  Service: Cardiovascular;;    Current Medications: Current Meds  Medication Sig  . carvedilol (COREG) 25 MG tablet Take 25 mg by mouth 2 (two) times daily with a meal.  . cetirizine (ZYRTEC) 10 MG tablet Take 10 mg by mouth daily.  . clobetasol ointment (TEMOVATE) 4.09 % Apply 1 application topically daily.  . clopidogrel (PLAVIX) 75 MG tablet TAKE ONE TABLET BY MOUTH DAILY  . Dexlansoprazole 30 MG capsule Take 1 capsule (30 mg total) by mouth daily.  Marland Kitchen glucose monitoring kit (FREESTYLE) monitoring kit 1 each by Does not apply route as needed for other. Dispense any model that is covered- dispense testing supplies for Q AC/ HS accuchecks- 1 month supply with one refil.  Marland Kitchen HUMIRA PEN-PS/UV/ADOL HS START 40 MG/0.8ML PNKT Take as directed.  . insulin aspart (NOVOLOG FLEXPEN) 100 UNIT/ML FlexPen Inject 3-20 Units into the skin 3 (three) times daily with meals. CBG 70 - 120: 0 units  CBG 121 - 150: 3 units  CBG 151 - 200: 4 units  CBG 201 - 250: 7 units  CBG 251 - 300: 11 units  CBG 301 - 350: 15 units  CBG 351 - 400: 20 units  . insulin detemir (LEVEMIR) 100 UNIT/ML injection Inject 0.3 mLs (30 Units total) into the skin at bedtime.  . isosorbide mononitrate (IMDUR) 60 MG 24 hr tablet Take 2 tablets (120 mg total) by mouth daily.  Marland Kitchen lisinopril (PRINIVIL,ZESTRIL) 20 MG  tablet Take 20 mg by mouth daily.   . metFORMIN (GLUCOPHAGE) 850 MG tablet Take 850 mg by mouth 3 (three) times daily with meals.  Marland Kitchen morphine (MS CONTIN) 60 MG 12 hr tablet Take 60 mg by mouth every 8 (eight) hours.   Marland Kitchen NARCAN 4 MG/0.1ML LIQD nasal spray kit Call EMS at first sign of opioid overdose. Inject spray into nostril. If no response in 2 minutes, inject opposite nostril.  Marland Kitchen neomycin-bacitracin-polymyxin (NEOSPORIN) ointment Apply 1 application topically as needed for wound care.  . nitroGLYCERIN (NITROSTAT) 0.4 MG SL tablet Place 1 tablet (0.4 mg total) under the tongue every 5 (five) minutes x 3 doses as needed for chest pain.  . Oxycodone HCl 20 MG TABS Take 20 mg by mouth every 4 (four) hours.   Marland Kitchen oxymetazoline (AFRIN) 0.05 % nasal spray Place 1 spray into both nostrils daily  as needed for congestion.  . rosuvastatin (CRESTOR) 20 MG tablet Take 1 tablet (20 mg total) by mouth daily.     Allergies:   Anaprox [naproxen sodium]; Aspirin; Brilinta [ticagrelor]; Ceftin [cefuroxime axetil]; Celexa [citalopram hydrobromide]; Ciprofloxacin; Coconut fragrance; Contrast media [iodinated diagnostic agents]; Cortisol [hydrocortisone]; Dapsone; Elavil [amitriptyline]; Hctz [hydrochlorothiazide]; Imitrex [sumatriptan]; Lexapro [escitalopram oxalate]; Methadone; Metrizamide; Motrin [ibuprofen]; Naprosyn [naproxen]; Neurontin [gabapentin]; Penicillins; Sulfa antibiotics; Sulfasalazine; Tagamet [cimetidine]; Toradol [ketorolac tromethamine]; Vancomycin; Zoloft [sertraline hcl]; Colcrys [colchicine]; Iodine; Lipitor [atorvastatin]; and Ranexa [ranolazine]   Social History   Socioeconomic History  . Marital status: Married    Spouse name: Not on file  . Number of children: Not on file  . Years of education: Not on file  . Highest education level: Not on file  Occupational History  . Not on file  Social Needs  . Financial resource strain: Not on file  . Food insecurity:    Worry: Not on file     Inability: Not on file  . Transportation needs:    Medical: Not on file    Non-medical: Not on file  Tobacco Use  . Smoking status: Former Smoker    Packs/day: 1.00    Years: 22.00    Pack years: 22.00    Types: Cigarettes    Last attempt to quit: 08/17/2010    Years since quitting: 7.7  . Smokeless tobacco: Never Used  Substance and Sexual Activity  . Alcohol use: Not Currently    Alcohol/week: 0.0 standard drinks  . Drug use: Never  . Sexual activity: Not Currently  Lifestyle  . Physical activity:    Days per week: Not on file    Minutes per session: Not on file  . Stress: Not on file  Relationships  . Social connections:    Talks on phone: Not on file    Gets together: Not on file    Attends religious service: Not on file    Active member of club or organization: Not on file    Attends meetings of clubs or organizations: Not on file    Relationship status: Not on file  Other Topics Concern  . Not on file  Social History Narrative  . Not on file     Family History: The patient's She was adopted. Family history is unknown by patient.  ROS:   Please see the history of present illness.    Joint discomfort, skin lesions face, legs, felt to be pyoderma gangrenosum.  Easy bruising, back pain, muscle pain, joint swelling difficulty with balance difficulty walking dyspnea on exertion recent chills recent fever excessive fatigue and near continuous chest pressure.  All other systems reviewed and are negative.  EKGs/Labs/Other Studies Reviewed:    The following studies were reviewed today: 2D Doppler echocardiogram June 2019: Study Conclusions  - Left ventricle: The cavity size was normal. Wall thickness was   normal. Systolic function was normal. The estimated ejection   fraction was in the range of 55% to 60%. Basal inferior   hypokinesis. - Aortic valve: There was no stenosis. - Right ventricle: The cavity size was normal. Systolic function   was normal. -  Pericardium, extracardiac: There was no pericardial effusion.  Impressions:  - Limited echo for pericardial effusion  EKG:  EKG none   Recent Labs: 10/02/2017: BUN 6; Creatinine, Ser 0.70; Hemoglobin 16.0; Platelets 174; Potassium 4.6; Sodium 137  Recent Lipid Panel    Component Value Date/Time   CHOL 125 11/07/2016 1124   TRIG 206 (H)  11/07/2016 1124   HDL 37 (L) 11/07/2016 1124   CHOLHDL 3.4 11/07/2016 1124   CHOLHDL 6.9 08/16/2016 0342   VLDL 24 08/16/2016 0342   LDLCALC 47 11/07/2016 1124    Physical Exam:    VS:  BP 120/74   Pulse 90   Ht _0  (1.753 m)   Wt 282 lb 6.4 oz (128.1 kg)   LMP  (LMP Unknown)   SpO2 97%   BMI 41.70 kg/m     Wt Readings from Last 3 Encounters:  05/29/18 282 lb 6.4 oz (128.1 kg)  02/22/18 255 lb (115.7 kg)  01/25/18 287 lb 12.8 oz (130.5 kg)     GEN: Obese. No acute distress HEENT: Normal.  Skin lesions on forehead, right earlobe, and nose. NECK: No JVD. LYMPHATICS: No lymphadenopathy CARDIAC: RRR.  No murmur, no gallop, trace ankle bilateral edema VASCULAR: Absent right radial but 2+ left and bilateral carotid pulses, no bruits RESPIRATORY:  Clear to auscultation without rales, wheezing or rhonchi  ABDOMEN: Soft, non-tender, non-distended, No pulsatile mass, MUSCULOSKELETAL: No deformity  SKIN: Warm and dry NEUROLOGIC:  Alert and oriented x 3 PSYCHIATRIC:  Normal affect   ASSESSMENT:    1. Coronary artery disease involving native coronary artery of native heart with angina pectoris ([place])   2. Diabetes mellitus type 2 in obese ([place])   3. Mixed hyperlipidemia   4. Essential hypertension   5. Behcet's syndrome ([place])   6. Pyoderma gangrenosum    PLAN:    In order of problems listed above:  1. Coronary disease has been aggressive possibly risk factor related with potential superimposed vasculitis in the setting of autoimmune disease (Behcet's and pyoderma gangrenosa).  Unable to have CTO PCI success.  Was seen by surgery  but there was concern that vessels may not be graftable and that she could have difficulty healing.  Therefore, we have decided to move forward with chronic medical therapy. 2. A1c target less than 7.  Encourage aerobic activity. 3. LDL target less than 70. 4. Blood pressure target 130/80 mmHg. 5. May need referral for consideration of management strategy and identification of possible systemic vasculitis. 6. Would be okay to use Humira.  Last LVEF was greater than 50%.  There is some possibility that Humira could cause decreased LV systolic function.  Overall education and awareness concerning primary/secondary risk prevention was discussed in detail: LDL less than 70, hemoglobin A1c less than 7, blood pressure target less than 130/80 mmHg, >150 minutes of moderate aerobic activity per week, avoidance of smoking, weight control (via diet and exercise), and continued surveillance/management of/for obstructive sleep apnea.  Encouraged 150 minutes of moderate activity per week.  Clinical follow-up in 6 months.   Medication Adjustments/Labs and Tests Ordered: Current medicines are reviewed at length with the patient today.  Concerns regarding medicines are outlined above.  No orders of the defined types were placed in this encounter.  No orders of the defined types were placed in this encounter.   Patient Instructions  Medication Instructions:  Your physician recommends that you continue on your current medications as directed. Please refer to the Current Medication list given to you today.  If you need a refill on your cardiac medications before your next appointment, please call your pharmacy.   Lab work: None If you have labs (blood work) drawn today and your tests are completely normal, you will receive your results only by: Marland Kitchen MyChart Message (if you have MyChart) OR . A paper copy in the  mail If you have any lab test that is abnormal or we need to change your treatment, we will call  you to review the results.  Testing/Procedures: None  Follow-Up: At [facility], you and your health needs are our priority.  As part of our continuing mission to provide you with exceptional heart care, we have created designated Provider Care Teams.  These Care Teams include your primary Cardiologist (physician) and Advanced Practice Providers (APPs -  Physician Assistants and Nurse Practitioners) who all work together to provide you with the care you need, when you need it. You will need a follow up appointment in 6 months.  Please call our office 2 months in advance to schedule this appointment.  You may see Sinclair Grooms, MD or one of the following Advanced Practice Providers on your designated Care Team:   Truitt Merle, NP Cecilie Kicks, NP . Kathyrn Drown, NP  Any Other Special Instructions Will Be Listed Below (If Applicable).       Signed, Sinclair Grooms, MD  05/29/2018 5:55 PM    [place]

## 2018-05-29 ENCOUNTER — Telehealth: Payer: Self-pay

## 2018-05-29 ENCOUNTER — Encounter: Payer: Self-pay | Admitting: Interventional Cardiology

## 2018-05-29 ENCOUNTER — Ambulatory Visit (INDEPENDENT_AMBULATORY_CARE_PROVIDER_SITE_OTHER): Payer: Medicare Other | Admitting: Interventional Cardiology

## 2018-05-29 VITALS — BP 120/74 | HR 90 | Ht 69.0 in | Wt 282.4 lb

## 2018-05-29 DIAGNOSIS — M352 Behcet's disease: Secondary | ICD-10-CM

## 2018-05-29 DIAGNOSIS — E1169 Type 2 diabetes mellitus with other specified complication: Secondary | ICD-10-CM

## 2018-05-29 DIAGNOSIS — L88 Pyoderma gangrenosum: Secondary | ICD-10-CM

## 2018-05-29 DIAGNOSIS — E782 Mixed hyperlipidemia: Secondary | ICD-10-CM

## 2018-05-29 DIAGNOSIS — E669 Obesity, unspecified: Secondary | ICD-10-CM | POA: Diagnosis not present

## 2018-05-29 DIAGNOSIS — I10 Essential (primary) hypertension: Secondary | ICD-10-CM

## 2018-05-29 DIAGNOSIS — I25119 Atherosclerotic heart disease of native coronary artery with unspecified angina pectoris: Secondary | ICD-10-CM | POA: Diagnosis not present

## 2018-05-29 NOTE — Telephone Encounter (Signed)
Interviewed patient during visit with cardiologist. Patient's most recent HA1C uncontrolled at 9.9% (06/28/17) where her Levemir was increased to 30 units once daily. She is also on sliding scale Novolog and metformin for glycemic control. She reports fasting BG 90-100 in the morning and 200-250 before lunch and dinner.    She is very interested in starting a GLP-1 agonist for better glycemic control and cardiovascular benefit. She is concerned about transportation and would like her primary care to initiate and provide follow up as needed. She is also connected to home health and has a diabetic educator through the home health agency. Patient was given handout on Victoza but expresses the most interest in a once weekly injection (Trulicity or Ozempic). Patient encouraged to follow up with PCP and call if she has any questions.    Will defer to PCP for initiation of GLP-1 agonist as appropriate.   Amanda Pea, Ilda Basset D PGY1 Pharmacy Resident  05/29/2018      4:39 PM

## 2018-05-29 NOTE — Patient Instructions (Signed)

## 2018-05-29 NOTE — Telephone Encounter (Signed)
Patient meets inclusion criteria for current pharmacy residency project to initiate SGLT2i or GLP1-RA therapy for cardiovascular benefit due to current diagnosis of ASCVD and DM.  Patient has scheduled appointment with Dr. Katrinka Blazing on 05/29/18.  Pharmacist will discuss initiation of Victoza titrated to 1.8mg  or Jardiance 10mg  daily and enrollment into study with patient and provider at upcoming office visit.   Relevant labs and dates: Lab Results  Component Value Date   HGBA1C 10.8 (H) 08/15/2016  HA1C 9.9% 06/28/17 per [facility]   Lab Results  Component Value Date   CREATININE 0.70 10/02/2017   CREATININE 0.67 04/19/2017   CREATININE 0.62 03/20/2017   CrCl cannot be calculated (Patient's most recent lab result is older than the maximum 21 days allowed.). Wt Readings from Last 1 Encounters:  02/22/18 255 lb (115.7 kg)   BP Readings from Last 1 Encounters:  02/22/18 115/81    Metformin use: Evette Cristal D PGY1 Pharmacy Resident  05/29/2018      10:14 AM

## 2018-05-29 NOTE — Addendum Note (Signed)
Addended by: SUPPLE, MEGAN E on: 05/29/2018 04:09 PM   Modules accepted: Orders

## 2018-06-11 DIAGNOSIS — G894 Chronic pain syndrome: Secondary | ICD-10-CM | POA: Diagnosis not present

## 2018-07-05 ENCOUNTER — Other Ambulatory Visit: Payer: Self-pay | Admitting: Interventional Cardiology

## 2018-08-08 ENCOUNTER — Other Ambulatory Visit: Payer: Self-pay | Admitting: Interventional Cardiology

## 2018-08-08 DIAGNOSIS — G894 Chronic pain syndrome: Secondary | ICD-10-CM | POA: Diagnosis not present

## 2018-08-31 ENCOUNTER — Other Ambulatory Visit: Payer: Self-pay | Admitting: Cardiology

## 2018-09-05 ENCOUNTER — Other Ambulatory Visit: Payer: Self-pay | Admitting: Interventional Cardiology

## 2018-09-13 DIAGNOSIS — G894 Chronic pain syndrome: Secondary | ICD-10-CM | POA: Diagnosis not present

## 2018-10-08 DIAGNOSIS — G894 Chronic pain syndrome: Secondary | ICD-10-CM | POA: Diagnosis not present

## 2018-10-30 ENCOUNTER — Other Ambulatory Visit: Payer: Self-pay | Admitting: Interventional Cardiology

## 2018-10-30 ENCOUNTER — Other Ambulatory Visit: Payer: Self-pay | Admitting: Medical

## 2018-10-31 DIAGNOSIS — G894 Chronic pain syndrome: Secondary | ICD-10-CM | POA: Diagnosis not present

## 2018-11-02 ENCOUNTER — Other Ambulatory Visit: Payer: Self-pay | Admitting: Interventional Cardiology

## 2018-11-02 NOTE — Telephone Encounter (Signed)
This needs to be filled by PCP or [facility] specialist.

## 2018-11-02 NOTE — Telephone Encounter (Signed)
Pt's pharmacy is requesting a refill on Levemir insulin. Would Dr. Tamala Julian like to refill this medication? Please address

## 2018-11-29 DIAGNOSIS — G894 Chronic pain syndrome: Secondary | ICD-10-CM | POA: Diagnosis not present

## 2018-12-27 DIAGNOSIS — G894 Chronic pain syndrome: Secondary | ICD-10-CM | POA: Diagnosis not present

## 2018-12-27 DIAGNOSIS — Z5181 Encounter for therapeutic drug level monitoring: Secondary | ICD-10-CM | POA: Diagnosis not present

## 2018-12-27 DIAGNOSIS — M5030 Other cervical disc degeneration, unspecified cervical region: Secondary | ICD-10-CM | POA: Diagnosis not present

## 2018-12-27 DIAGNOSIS — M1990 Unspecified osteoarthritis, unspecified site: Secondary | ICD-10-CM | POA: Diagnosis not present

## 2019-01-23 ENCOUNTER — Other Ambulatory Visit: Payer: Self-pay | Admitting: Interventional Cardiology

## 2019-01-23 DIAGNOSIS — M1990 Unspecified osteoarthritis, unspecified site: Secondary | ICD-10-CM | POA: Diagnosis not present

## 2019-01-23 DIAGNOSIS — G894 Chronic pain syndrome: Secondary | ICD-10-CM | POA: Diagnosis not present

## 2019-01-23 DIAGNOSIS — Z5181 Encounter for therapeutic drug level monitoring: Secondary | ICD-10-CM | POA: Diagnosis not present

## 2019-01-23 DIAGNOSIS — M5030 Other cervical disc degeneration, unspecified cervical region: Secondary | ICD-10-CM | POA: Diagnosis not present

## 2019-02-19 DIAGNOSIS — M1990 Unspecified osteoarthritis, unspecified site: Secondary | ICD-10-CM | POA: Diagnosis not present

## 2019-02-19 DIAGNOSIS — Z5181 Encounter for therapeutic drug level monitoring: Secondary | ICD-10-CM | POA: Diagnosis not present

## 2019-02-19 DIAGNOSIS — G894 Chronic pain syndrome: Secondary | ICD-10-CM | POA: Diagnosis not present

## 2019-02-19 DIAGNOSIS — M5030 Other cervical disc degeneration, unspecified cervical region: Secondary | ICD-10-CM | POA: Diagnosis not present

## 2019-03-20 DIAGNOSIS — G894 Chronic pain syndrome: Secondary | ICD-10-CM | POA: Diagnosis not present

## 2019-03-20 DIAGNOSIS — Z5181 Encounter for therapeutic drug level monitoring: Secondary | ICD-10-CM | POA: Diagnosis not present

## 2019-03-20 DIAGNOSIS — M5030 Other cervical disc degeneration, unspecified cervical region: Secondary | ICD-10-CM | POA: Diagnosis not present

## 2019-03-20 DIAGNOSIS — M1990 Unspecified osteoarthritis, unspecified site: Secondary | ICD-10-CM | POA: Diagnosis not present

## 2019-04-22 DIAGNOSIS — G894 Chronic pain syndrome: Secondary | ICD-10-CM | POA: Diagnosis not present

## 2019-05-20 ENCOUNTER — Other Ambulatory Visit: Payer: Self-pay | Admitting: Cardiology

## 2019-05-20 DIAGNOSIS — M5030 Other cervical disc degeneration, unspecified cervical region: Secondary | ICD-10-CM | POA: Diagnosis not present

## 2019-05-20 DIAGNOSIS — M1990 Unspecified osteoarthritis, unspecified site: Secondary | ICD-10-CM | POA: Diagnosis not present

## 2019-05-20 DIAGNOSIS — G894 Chronic pain syndrome: Secondary | ICD-10-CM | POA: Diagnosis not present

## 2019-05-20 DIAGNOSIS — M5136 Other intervertebral disc degeneration, lumbar region: Secondary | ICD-10-CM | POA: Diagnosis not present

## 2019-06-12 ENCOUNTER — Other Ambulatory Visit: Payer: Self-pay | Admitting: Interventional Cardiology

## 2019-06-12 NOTE — Progress Notes (Signed)
Cardiology Office Note:    Date:  06/13/2019   ID:  Brittney Banks, DOB 08-18-1970, MRN [id number]  PCP:  Brittney Hipps, MD  Cardiologist:  Brittney Grooms, MD   Referring MD: Brittney Hipps, MD   Chief Complaint  Patient presents with  . Coronary Artery Disease    History of Present Illness:    Brittney Banks is a 49 y.o. female with a hx of hypertension, prior smoker, diabetes mellitus type 2, hyperlipidemia,, hirsutism, Behcet's disease, knownCAD withinferior STEMI treated with Onyx DES April 2018, recurring symptoms led to 2.25 Onyx DES of high-grade small LAD July 2018, subsequent recurrent pain January 2019 demonstrating total occlusion of the RCA with development of collaterals. Failed CTO attempt on the right coronary in July 2019.Brittney KitchenFelt to be non-ideal forr CABG due to questionable targets and the source of pain.  She continues to have trouble.  She still has tightness with exertion.  She has lost 40 pounds since last office visit.  She has not concerned about a new discomfort that occurs in the left shoulder and into the left scapula that hits, causing a grabbing type feeling, lasts 20 to 60 seconds and then resolves.  Random occurrence.  Not precipitated by physical activity.  She is not smoking.  Attempting to follow a low-salt low carbohydrate diet.  Past Medical History:  Diagnosis Date  . Behcet's disease ([place])    "similiar to MS" (10/05/2017)  . Cellulitis 03/2016  . Chronic back pain    "all over"  . Chronic bronchitis ([place])   . Coronary artery disease    11/09/16 PCI with DES to mLAD, patent RCA stent  . DDD (degenerative disc disease), lumbar   . GERD (gastroesophageal reflux disease)   . Hyperlipidemia   . Hypertension   . LV dysfunction   . MI (myocardial infarction) ([place]) 10/2016  . Migraine    "quite a bit when I was younger; now maybe 1/year" (10/05/2017)  . MRSA carrier 2008  . MRSA infection 2008   BOTH LEGS  . Myocardial infarction involving  right coronary artery ([place]) 08/15/2016  . On home oxygen therapy    "3L prn" (10/05/2017)  . Osteoarthritis    "in all my major bones" (10/05/2017)  . Pneumonia    "several times" (10/05/2017)  . Pyoderma gangrenosum    "skin autoimmune disorder" (10/05/2017)  . Seasonal allergies   . Type II diabetes mellitus ([place])    type 2    Past Surgical History:  Procedure Laterality Date  . ANTERIOR CERVICAL DECOMP/DISCECTOMY FUSION  2008  . BILATERAL CARPAL TUNNEL RELEASE Bilateral 2007  . CORONARY CTO INTERVENTION  10/05/2017  . CORONARY CTO INTERVENTION N/A 10/05/2017   Procedure: CORONARY CTO INTERVENTION;  Surgeon: Jettie Booze, MD;  Location: [facility];  Service: Cardiovascular;  Laterality: N/A;  . CORONARY STENT INTERVENTION N/A 08/15/2016   Procedure: Coronary Stent Intervention;  Surgeon: Belva Crome, MD;  Location: [facility];  Service: Cardiovascular;  Laterality: N/A;  . CORONARY STENT INTERVENTION N/A 11/09/2016   Procedure: Coronary Stent Intervention;  Surgeon: Belva Crome, MD;  Location: [facility];  Service: Cardiovascular;  Laterality: N/A;  . ECTOPIC PREGNANCY SURGERY  X 3   "last time they took my tubes out"  . INCISION AND DRAINAGE     "had to clean MRSA off :RUE; 2 places on left inner leg; 1 place on back side of my right leg" (10/05/2017)  .  JOINT REPLACEMENT    . KNEE ARTHROSCOPY Right X 2  . LEFT HEART CATH AND CORONARY ANGIOGRAPHY N/A 08/15/2016   Procedure: Left Heart Cath and Coronary Angiography;  Surgeon: Belva Crome, MD;  Location: [facility];  Service: Cardiovascular;  Laterality: N/A;  . LEFT HEART CATH AND CORONARY ANGIOGRAPHY N/A 11/09/2016   Procedure: Left Heart Cath and Coronary Angiography;  Surgeon: Belva Crome, MD;  Location: [facility];  Service: Cardiovascular;  Laterality: N/A;  . LEFT HEART CATH AND CORONARY ANGIOGRAPHY N/A 04/19/2017   Procedure: LEFT HEART CATH AND CORONARY ANGIOGRAPHY;  Surgeon:  Sherren Mocha, MD;  Location: [facility];  Service: Cardiovascular;  Laterality: N/A;  . TONSILLECTOMY    . TOTAL KNEE ARTHROPLASTY Right   . ULTRASOUND GUIDANCE FOR VASCULAR ACCESS  11/09/2016   Procedure: Ultrasound Guidance For Vascular Access;  Surgeon: Belva Crome, MD;  Location: [facility];  Service: Cardiovascular;;    Current Medications: Current Meds  Medication Sig  . carvedilol (COREG) 25 MG tablet Take 1 tablet (25 mg total) by mouth 2 (two) times daily.  . cetirizine (ZYRTEC) 10 MG tablet Take 10 mg by mouth daily.  . clobetasol ointment (TEMOVATE) 2.86 % Apply 1 application topically daily.  . clopidogrel (PLAVIX) 75 MG tablet Take 1 tablet (75 mg total) by mouth daily. Please make overdue appt with Dr. Tamala Julian before anymore refills. 1st attempt  . Dexlansoprazole 30 MG capsule Take 1 capsule (30 mg total) by mouth daily.  Brittney Banks glucose monitoring kit (FREESTYLE) monitoring kit 1 each by Does not apply route as needed for other. Dispense any model that is covered- dispense testing supplies for Q AC/ HS accuchecks- 1 month supply with one refil.  . insulin aspart (NOVOLOG FLEXPEN) 100 UNIT/ML FlexPen Inject 3-20 Units into the skin 3 (three) times daily with meals. CBG 70 - 120: 0 units  CBG 121 - 150: 3 units  CBG 151 - 200: 4 units  CBG 201 - 250: 7 units  CBG 251 - 300: 11 units  CBG 301 - 350: 15 units  CBG 351 - 400: 20 units  . insulin detemir (LEVEMIR) 100 UNIT/ML injection Inject 0.3 mLs (30 Units total) into the skin at bedtime.  . isosorbide mononitrate (IMDUR) 60 MG 24 hr tablet TAKE 2 TABLETS BY MOUTH EVERY DAY  . lisinopril (ZESTRIL) 20 MG tablet TAKE ONE TABLET BY MOUTH EVERY DAY *MUST HAVE APPOINTMENT*  . metFORMIN (GLUCOPHAGE) 850 MG tablet Take 850 mg by mouth 3 (three) times daily with meals.  Brittney Banks morphine (MS CONTIN) 60 MG 12 hr tablet Take 60 mg by mouth every 8 (eight) hours.   Brittney Banks NARCAN 4 MG/0.1ML LIQD nasal spray kit Call EMS at first sign  of opioid overdose. Inject spray into nostril. If no response in 2 minutes, inject opposite nostril.  Brittney Banks neomycin-bacitracin-polymyxin (NEOSPORIN) ointment Apply 1 application topically as needed for wound care.  . nitroGLYCERIN (NITROSTAT) 0.4 MG SL tablet Place 1 tablet (0.4 mg total) under the tongue every 5 (five) minutes x 3 doses as needed for chest pain.  . Oxycodone HCl 20 MG TABS Take 20 mg by mouth every 4 (four) hours.   Brittney Banks oxymetazoline (AFRIN) 0.05 % nasal spray Place 1 spray into both nostrils daily as needed for congestion.  . rosuvastatin (CRESTOR) 20 MG tablet TAKE ONE TABLET BY MOUTH EVERY DAY     Allergies:   Anaprox [naproxen sodium], Aspirin, Brilinta [ticagrelor], Ceftin [  cefuroxime axetil], Celexa [citalopram hydrobromide], Ciprofloxacin, Coconut fragrance, Contrast media [iodinated diagnostic agents], Cortisol [hydrocortisone], Dapsone, Elavil [amitriptyline], Hctz [hydrochlorothiazide], Imitrex [sumatriptan], Lexapro [escitalopram oxalate], Methadone, Metrizamide, Motrin [ibuprofen], Naprosyn [naproxen], Neurontin [gabapentin], Penicillins, Sulfa antibiotics, Sulfasalazine, Tagamet [cimetidine], Toradol [ketorolac tromethamine], Vancomycin, Zoloft [sertraline hcl], Colcrys [colchicine], Iodine, Lipitor [atorvastatin], and Ranexa [ranolazine]   Social History   Socioeconomic History  . Marital status: Married    Spouse name: Not on file  . Number of children: Not on file  . Years of education: Not on file  . Highest education level: Not on file  Occupational History  . Not on file  Tobacco Use  . Smoking status: Former Smoker    Packs/day: 1.00    Years: 22.00    Pack years: 22.00    Types: Cigarettes    Quit date: 08/17/2010    Years since quitting: 8.8  . Smokeless tobacco: Never Used  Substance and Sexual Activity  . Alcohol use: Not Currently    Alcohol/week: 0.0 standard drinks  . Drug use: Never  . Sexual activity: Not Currently  Other Topics Concern  .  Not on file  Social History Narrative  . Not on file   Social Determinants of Health   Financial Resource Strain:   . Difficulty of Paying Living Expenses: Not on file  Food Insecurity:   . Worried About Charity fundraiser in the Last Year: Not on file  . Ran Out of Food in the Last Year: Not on file  Transportation Needs:   . Lack of Transportation (Medical): Not on file  . Lack of Transportation (Non-Medical): Not on file  Physical Activity:   . Days of Exercise per Week: Not on file  . Minutes of Exercise per Session: Not on file  Stress:   . Feeling of Stress : Not on file  Social Connections:   . Frequency of Communication with Friends and Family: Not on file  . Frequency of Social Gatherings with Friends and Family: Not on file  . Attends Religious Services: Not on file  . Active Member of Clubs or Organizations: Not on file  . Attends Archivist Meetings: Not on file  . Marital Status: Not on file     Family History: The patient's She was adopted. Family history is unknown by patient.  ROS:   Please see the history of present illness.    Non-smoking.  Still having anxiety.  Under a lot of stress.  All other systems reviewed and are negative.  EKGs/Labs/Other Studies Reviewed:    The following studies were reviewed today:  No recent laboratory data.  EKG:  EKG normal sinus rhythm, vertical axis, heart rate 98 bpm.  No significant change compared to prior.  Recent Labs: No results found for requested labs within last 8760 hours.  Recent Lipid Panel    Component Value Date/Time   CHOL 125 11/07/2016 1124   TRIG 206 (H) 11/07/2016 1124   HDL 37 (L) 11/07/2016 1124   CHOLHDL 3.4 11/07/2016 1124   CHOLHDL 6.9 08/16/2016 0342   VLDL 24 08/16/2016 0342   LDLCALC 47 11/07/2016 1124    Physical Exam:    VS:  BP (!) 144/90   Pulse 98   Ht '5\' 9"'$  (1.753 m)   Wt 247 lb 12.8 oz (112.4 kg)   LMP  (LMP Unknown)   SpO2 96%   BMI 36.59 kg/m     Wt  Readings from Last 3 Encounters:  06/13/19 247 lb 12.8  oz (112.4 kg)  05/29/18 282 lb 6.4 oz (128.1 kg)  02/22/18 255 lb (115.7 kg)     GEN: BMI 36.. No acute distress HEENT: Normal NECK: No JVD. LYMPHATICS: No lymphadenopathy CARDIAC:  RRR without murmur, gallop, or edema. VASCULAR:  Normal Pulses. No bruits. RESPIRATORY:  Clear to auscultation without rales, wheezing or rhonchi  ABDOMEN: Soft, non-tender, non-distended, No pulsatile mass, MUSCULOSKELETAL: No deformity  SKIN: Warm and dry NEUROLOGIC:  Alert and oriented x 3 PSYCHIATRIC:  Normal affect   ASSESSMENT:    1. Coronary artery disease involving native coronary artery of native heart with angina pectoris ([place])   2. Diabetes mellitus type 2 in obese ([place])   3. Mixed hyperlipidemia   4. Essential hypertension   5. Behcet's syndrome ([place])   6. Morbid obesity ([place])   7. Educated about COVID-19 virus infection    PLAN:    In order of problems listed above:  1. Secondary prevention discussed 2. A1c is obtained today 3. Lipid panel is obtained today along with a liver panel. 4. Low-salt diet is advocated.  Exercise and weight loss plus avoidance of nonsteroidals is discussed. 5. Not discussed 6. Decrease calories and increase physical activity especially attempting to hit at least 150 minutes of moderate activity per day is emphasized. 7. I encouraged use of the COVID-19 vaccine.  She was on the fence but now feels that she will go ahead and take it.  In the meantime 3W's is recommended.  Overall education and awareness concerning primary/secondary risk prevention was discussed in detail: LDL less than 70, hemoglobin A1c less than 7, blood pressure target less than 130/80 mmHg, >150 minutes of moderate aerobic activity per week, avoidance of smoking, weight control (via diet and exercise), and continued surveillance/management of/for obstructive sleep apnea.    Medication Adjustments/Labs and Tests Ordered: Current  medicines are reviewed at length with the patient today.  Concerns regarding medicines are outlined above.  Orders Placed This Encounter  Procedures  . EKG 12-Lead   No orders of the defined types were placed in this encounter.   There are no Patient Instructions on file for this visit.   Signed, Brittney Grooms, MD  06/13/2019 2:35 PM    [facility]

## 2019-06-13 ENCOUNTER — Other Ambulatory Visit: Payer: Self-pay

## 2019-06-13 ENCOUNTER — Ambulatory Visit (INDEPENDENT_AMBULATORY_CARE_PROVIDER_SITE_OTHER): Payer: Medicare Other | Admitting: Interventional Cardiology

## 2019-06-13 ENCOUNTER — Encounter: Payer: Self-pay | Admitting: Interventional Cardiology

## 2019-06-13 VITALS — BP 144/90 | HR 98 | Ht 69.0 in | Wt 247.8 lb

## 2019-06-13 DIAGNOSIS — I10 Essential (primary) hypertension: Secondary | ICD-10-CM | POA: Diagnosis not present

## 2019-06-13 DIAGNOSIS — E669 Obesity, unspecified: Secondary | ICD-10-CM | POA: Diagnosis not present

## 2019-06-13 DIAGNOSIS — I25119 Atherosclerotic heart disease of native coronary artery with unspecified angina pectoris: Secondary | ICD-10-CM

## 2019-06-13 DIAGNOSIS — E782 Mixed hyperlipidemia: Secondary | ICD-10-CM | POA: Diagnosis not present

## 2019-06-13 DIAGNOSIS — E1169 Type 2 diabetes mellitus with other specified complication: Secondary | ICD-10-CM | POA: Diagnosis not present

## 2019-06-13 DIAGNOSIS — M352 Behcet's disease: Secondary | ICD-10-CM | POA: Diagnosis not present

## 2019-06-13 DIAGNOSIS — Z7189 Other specified counseling: Secondary | ICD-10-CM

## 2019-06-13 NOTE — Patient Instructions (Signed)
Medication Instructions:  Your physician recommends that you continue on your current medications as directed. Please refer to the Current Medication list given to you today.  *If you need a refill on your cardiac medications before your next appointment, please call your pharmacy*  Lab Work: A1C, Liver, Lipid, CBC and BMET today  If you have labs (blood work) drawn today and your tests are completely normal, you will receive your results only by: Marland Kitchen MyChart Message (if you have MyChart) OR . A paper copy in the mail If you have any lab test that is abnormal or we need to change your treatment, we will call you to review the results.  Testing/Procedures: None  Follow-Up: At [facility], you and your health needs are our priority.  As part of our continuing mission to provide you with exceptional heart care, we have created designated Provider Care Teams.  These Care Teams include your primary Cardiologist (physician) and Advanced Practice Providers (APPs -  Physician Assistants and Nurse Practitioners) who all work together to provide you with the care you need, when you need it.  Your next appointment:   9 month(s)  The format for your next appointment:   In Person  Provider:   You may see Lesleigh Noe, MD or one of the following Advanced Practice Providers on your designated Care Team:    Norma Fredrickson, NP  Nada Boozer, NP  Georgie Chard, NP   Other Instructions  Dr. Katrinka Blazing recommends that you stop taking Afrin nasal spray and switch to Fluticasone.

## 2019-06-14 LAB — LIPID PANEL
Chol/HDL Ratio: 5.8 ratio — ABNORMAL HIGH (ref 0.0–4.4)
Cholesterol, Total: 222 mg/dL — ABNORMAL HIGH (ref 100–199)
HDL: 38 mg/dL — ABNORMAL LOW (ref >39)
LDL Chol Calc (NIH): 158 mg/dL — ABNORMAL HIGH (ref 0–99)
Triglycerides: 143 mg/dL (ref 0–149)
VLDL Cholesterol Cal: 26 mg/dL (ref 5–40)

## 2019-06-14 LAB — CBC
Hematocrit: 53.7 % — ABNORMAL HIGH (ref 34.0–46.6)
Hemoglobin: 19.3 g/dL — ABNORMAL HIGH (ref 11.1–15.9)
MCH: 31.4 pg (ref 26.6–33.0)
MCHC: 35.9 g/dL — ABNORMAL HIGH (ref 31.5–35.7)
MCV: 87 fL (ref 79–97)
Platelets: 321 10*3/uL (ref 150–450)
RBC: 6.15 x10E6/uL — ABNORMAL HIGH (ref 3.77–5.28)
RDW: 12 % (ref 11.7–15.4)
WBC: 14.9 10*3/uL — ABNORMAL HIGH (ref 3.4–10.8)

## 2019-06-14 LAB — BASIC METABOLIC PANEL
BUN/Creatinine Ratio: 17 (ref 9–23)
BUN: 12 mg/dL (ref 6–24)
CO2: 21 mmol/L (ref 20–29)
Calcium: 10.1 mg/dL (ref 8.7–10.2)
Chloride: 99 mmol/L (ref 96–106)
Creatinine, Ser: 0.7 mg/dL (ref 0.57–1.00)
GFR calc Af Amer: 119 mL/min/{1.73_m2} (ref >59)
GFR calc non Af Amer: 104 mL/min/{1.73_m2} (ref >59)
Glucose: 230 mg/dL — ABNORMAL HIGH (ref 65–99)
Potassium: 5.1 mmol/L (ref 3.5–5.2)
Sodium: 137 mmol/L (ref 134–144)

## 2019-06-14 LAB — HEPATIC FUNCTION PANEL
ALT: 15 IU/L (ref 0–32)
AST: 14 IU/L (ref 0–40)
Albumin: 4.6 g/dL (ref 3.8–4.8)
Alkaline Phosphatase: 77 IU/L (ref 39–117)
Bilirubin Total: 0.4 mg/dL (ref 0.0–1.2)
Bilirubin, Direct: 0.14 mg/dL (ref 0.00–0.40)
Total Protein: 7.6 g/dL (ref 6.0–8.5)

## 2019-06-14 LAB — HEMOGLOBIN A1C
Est. average glucose Bld gHb Est-mCnc: 194 mg/dL
Hgb A1c MFr Bld: 8.4 % — ABNORMAL HIGH (ref 4.8–5.6)

## 2019-06-17 ENCOUNTER — Telehealth: Payer: Self-pay | Admitting: *Deleted

## 2019-06-17 DIAGNOSIS — M5030 Other cervical disc degeneration, unspecified cervical region: Secondary | ICD-10-CM | POA: Diagnosis not present

## 2019-06-17 DIAGNOSIS — E782 Mixed hyperlipidemia: Secondary | ICD-10-CM

## 2019-06-17 DIAGNOSIS — G894 Chronic pain syndrome: Secondary | ICD-10-CM | POA: Diagnosis not present

## 2019-06-17 DIAGNOSIS — M5136 Other intervertebral disc degeneration, lumbar region: Secondary | ICD-10-CM | POA: Diagnosis not present

## 2019-06-17 MED ORDER — ROSUVASTATIN CALCIUM 40 MG PO TABS: 40.0000 mg | ORAL_TABLET | Freq: Every day | ORAL | 3 refills | Status: DC

## 2019-06-17 NOTE — Telephone Encounter (Signed)
-----   Message from Lyn Records, MD sent at 06/16/2019  9:48 PM EST ----- Let the patient know the cholesterol is terrible. Is she taking Crestor 20 mg daily? Increase to 40 mg dialy, liver and lipid in 6 weeks and refer to Lipid Clinic at that time for initiation of PCSK-9 A copy will be sent to Marylen Ponto, MD

## 2019-06-17 NOTE — Telephone Encounter (Signed)
Spoke with pt and went over results and recommendations.  Pt agreeable to Lipid Clinic.  Scheduled pt for labs and Lipid Clinic appt on 4/15.

## 2019-06-17 NOTE — Telephone Encounter (Signed)
Left message to call back  Orders have been placed for labs and referral.

## 2019-07-15 ENCOUNTER — Other Ambulatory Visit: Payer: Self-pay | Admitting: Cardiology

## 2019-07-15 ENCOUNTER — Other Ambulatory Visit: Payer: Self-pay | Admitting: Interventional Cardiology

## 2019-07-15 DIAGNOSIS — M5136 Other intervertebral disc degeneration, lumbar region: Secondary | ICD-10-CM | POA: Diagnosis not present

## 2019-07-15 DIAGNOSIS — M5030 Other cervical disc degeneration, unspecified cervical region: Secondary | ICD-10-CM | POA: Diagnosis not present

## 2019-07-15 DIAGNOSIS — G894 Chronic pain syndrome: Secondary | ICD-10-CM | POA: Diagnosis not present

## 2019-07-15 DIAGNOSIS — M1990 Unspecified osteoarthritis, unspecified site: Secondary | ICD-10-CM | POA: Diagnosis not present

## 2019-08-01 ENCOUNTER — Ambulatory Visit: Payer: Medicare Other

## 2019-08-01 ENCOUNTER — Other Ambulatory Visit: Payer: Medicare Other | Admitting: *Deleted

## 2019-08-01 ENCOUNTER — Other Ambulatory Visit: Payer: Self-pay

## 2019-08-01 ENCOUNTER — Encounter (INDEPENDENT_AMBULATORY_CARE_PROVIDER_SITE_OTHER): Payer: Self-pay

## 2019-08-01 ENCOUNTER — Ambulatory Visit (INDEPENDENT_AMBULATORY_CARE_PROVIDER_SITE_OTHER): Payer: Medicare Other | Admitting: Pharmacist

## 2019-08-01 DIAGNOSIS — I25119 Atherosclerotic heart disease of native coronary artery with unspecified angina pectoris: Secondary | ICD-10-CM | POA: Diagnosis not present

## 2019-08-01 DIAGNOSIS — E782 Mixed hyperlipidemia: Secondary | ICD-10-CM | POA: Diagnosis not present

## 2019-08-01 NOTE — Patient Instructions (Signed)
It was a pleasure to meet you today!  I will submit a prior authorization for Praluent.  I will call you once a determination is made. Please CONTINUE your rosuvastatin 40mg  daily  Try to avoid mayonnaise, butter and watch your sugar intake with cereals  Call at [phone] with any questions or concerns.

## 2019-08-01 NOTE — Progress Notes (Signed)
Patient ID: Brittney Banks                 DOB: January 12, 1971                    MRN: [id number]     HPI: Brittney Banks is a 49 y.o. female patient referred to lipid clinic by Dr. Tamala Julian. PMH is significant for  hypertension, prior smoker, diabetes mellitus type 2, hyperlipidemia,, hirsutism, Behcet's disease, knownCAD withinferior STEMI treated with Onyx DES April 2018, recurring symptoms led to 2.25 Onyx DES of high-grade small LAD July 2018, subsequent recurrent pain January 2019 demonstrating total occlusion of the RCA with development of collaterals. Lipids drawn in Feb showed elevated LDL.  Patient presents to for initial lipid clinic visit. She is tolerating rosuvastatin '40mg'$  well. She exercises daily, but her walking is limited to about 10-15 min at a time due to getting winded easily.   Current Medications: rosuvastatin '40mg'$  daily Intolerances: atorvastatin 80? Risk Factors: recurrent ASCVD, DM, HTN LDL goal: <55  Diet: breakfast: cereal w/ milk (cinnomon toast crush, frosted shredded wheat) oatmeal (low sugar instant packets) Lunch: sandwich (mustard, mayonnaise, ham [place] cheese), fruit Dinner: meat (chicken, lean cut of beef) (grilled/baked), vegetables (steamed) heart healthy butter Does not eat fried food often- once a month Eats mostly at home Drink: flavored water, diet pepsi (no more than 2 a day) unsweet tea w/ splenda Snacks: fruit (orange, banana, grapes, low fat chips)  Exercise: walks about 10-15 min at a time a few times a day everyday   Family History: The patient's She was adopted. Family history is unknown by patient.  Social History: former smoker, no ETOH, no drugs  Labs: 06/13/19 TC 222, TG 143, HDL 38, LDL 158  Past Medical History:  Diagnosis Date  . Behcet's disease ([place])    "similiar to MS" (10/05/2017)  . Cellulitis 03/2016  . Chronic back pain    "all over"  . Chronic bronchitis ([place])   . Coronary artery disease    11/09/16 PCI with DES to mLAD,  patent RCA stent  . DDD (degenerative disc disease), lumbar   . GERD (gastroesophageal reflux disease)   . Hyperlipidemia   . Hypertension   . LV dysfunction   . MI (myocardial infarction) ([place]) 10/2016  . Migraine    "quite a bit when I was younger; now maybe 1/year" (10/05/2017)  . MRSA carrier 2008  . MRSA infection 2008   BOTH LEGS  . Myocardial infarction involving right coronary artery ([place]) 08/15/2016  . On home oxygen therapy    "3L prn" (10/05/2017)  . Osteoarthritis    "in all my major bones" (10/05/2017)  . Pneumonia    "several times" (10/05/2017)  . Pyoderma gangrenosum    "skin autoimmune disorder" (10/05/2017)  . Seasonal allergies   . Type II diabetes mellitus ([place])    type 2    Current Outpatient Medications on File Prior to Visit  Medication Sig Dispense Refill  . carvedilol (COREG) 25 MG tablet Take 1 tablet (25 mg total) by mouth 2 (two) times daily. 180 tablet 3  . cetirizine (ZYRTEC) 10 MG tablet Take 10 mg by mouth daily.    . clobetasol ointment (TEMOVATE) 3.71 % Apply 1 application topically daily.  5  . clopidogrel (PLAVIX) 75 MG tablet Take 1 tablet (75 mg total) by mouth daily. Please make overdue appt with Dr. Tamala Julian before anymore refills. 1st attempt 30 tablet 0  . Dexlansoprazole 30 MG  capsule Take 1 capsule (30 mg total) by mouth daily. 30 capsule 3  . glucose monitoring kit (FREESTYLE) monitoring kit 1 each by Does not apply route as needed for other. Dispense any model that is covered- dispense testing supplies for Q AC/ HS accuchecks- 1 month supply with one refil. 1 each 1  . insulin aspart (NOVOLOG FLEXPEN) 100 UNIT/ML FlexPen Inject 3-20 Units into the skin 3 (three) times daily with meals. CBG 70 - 120: 0 units  CBG 121 - 150: 3 units  CBG 151 - 200: 4 units  CBG 201 - 250: 7 units  CBG 251 - 300: 11 units  CBG 301 - 350: 15 units  CBG 351 - 400: 20 units 15 mL 11  . insulin detemir (LEVEMIR) 100 UNIT/ML injection Inject 0.3 mLs (30 Units  total) into the skin at bedtime. 10 mL 11  . isosorbide mononitrate (IMDUR) 60 MG 24 hr tablet TAKE 2 TABLETS BY MOUTH EVERY DAY 180 tablet 3  . lisinopril (ZESTRIL) 20 MG tablet TAKE ONE TABLET BY MOUTH EVERY DAY *MUST HAVE APPOINTMENT* 90 tablet 1  . metFORMIN (GLUCOPHAGE) 850 MG tablet Take 850 mg by mouth 3 (three) times daily with meals.    Marland Kitchen morphine (MS CONTIN) 60 MG 12 hr tablet Take 60 mg by mouth every 8 (eight) hours.     Marland Kitchen NARCAN 4 MG/0.1ML LIQD nasal spray kit Call EMS at first sign of opioid overdose. Inject spray into nostril. If no response in 2 minutes, inject opposite nostril.  0  . neomycin-bacitracin-polymyxin (NEOSPORIN) ointment Apply 1 application topically as needed for wound care.    . nitroGLYCERIN (NITROSTAT) 0.4 MG SL tablet Place 1 tablet (0.4 mg total) under the tongue every 5 (five) minutes x 3 doses as needed for chest pain. 25 tablet 6  . Oxycodone HCl 20 MG TABS Take 20 mg by mouth every 4 (four) hours.     Marland Kitchen oxymetazoline (AFRIN) 0.05 % nasal spray Place 1 spray into both nostrils daily as needed for congestion.    . rosuvastatin (CRESTOR) 40 MG tablet Take 1 tablet (40 mg total) by mouth daily. 90 tablet 3   No current facility-administered medications on file prior to visit.    Allergies  Allergen Reactions  . Anaprox [Naproxen Sodium] Shortness Of Breath and Other (See Comments)    Redness of skin and difficulty breathing   . Aspirin Nausea And Vomiting and Other (See Comments)    Severe cramping, vomiting   . Brilinta [Ticagrelor] Shortness Of Breath  . Ceftin [Cefuroxime Axetil] Shortness Of Breath and Other (See Comments)    Skin turns red   . Celexa [Citalopram Hydrobromide] Shortness Of Breath  . Ciprofloxacin Shortness Of Breath and Other (See Comments)    Skin turns red   . Coconut Fragrance Hives, Shortness Of Breath, Swelling and Other (See Comments)    Reaction to any kind of coconut Mouth and tongue swelling  . Contrast Media  [Iodinated Diagnostic Agents] Shortness Of Breath    Breathing difficulty  . Cortisol [Hydrocortisone] Shortness Of Breath    Breathing difficulty and redness of skin  . Dapsone Nausea And Vomiting    Extreme muscle weakness  . Elavil [Amitriptyline] Shortness Of Breath     Redness of skin and blisters of skin Difficulty breathing  . Hctz [Hydrochlorothiazide] Shortness Of Breath  . Imitrex [Sumatriptan] Shortness Of Breath    Redness and difficulty breathing   . Lexapro [Escitalopram Oxalate] Shortness Of Breath  Breathing difficulty Redness to skin  . Methadone Nausea And Vomiting    Severely vomiting   . Metrizamide Shortness Of Breath    Breathing difficulty  . Motrin [Ibuprofen] Nausea And Vomiting  . Naprosyn [Naproxen] Shortness Of Breath    Breathing difficulty Redness of skin  . Neurontin [Gabapentin] Shortness Of Breath    Breathing difficulty  Redness to skin Breathing difficulty  Redness to skin  . Penicillins Shortness Of Breath and Rash    Has patient had a PCN reaction causing immediate rash, facial/tongue/throat swelling, SOB or lightheadedness with hypotension: Yes Has patient had a PCN reaction causing severe rash involving mucus membranes or skin necrosis: Yes Has patient had a PCN reaction that required hospitalization: Yes Has patient had a PCN reaction occurring within the last 10 years: No If all of the above answers are "NO", then may proceed with Cephalosporin use.   . Sulfa Antibiotics Shortness Of Breath    Severe breathing difficulty And redness of skin  . Sulfasalazine Shortness Of Breath    Severe breathing difficulty And redness of skin  . Tagamet [Cimetidine] Nausea And Vomiting  . Toradol [Ketorolac Tromethamine] Shortness Of Breath    Redness to skin  . Vancomycin Rash    Patient Confirms Red mans syndome type rxn - Tolerated a slow infusion in past  . Zoloft [Sertraline Hcl] Shortness Of Breath and Other (See Comments)     Breathing difficulty Redness to skin  . Colcrys [Colchicine] Nausea And Vomiting  . Iodine Swelling and Other (See Comments)    Redness to skin  . Lipitor [Atorvastatin] Diarrhea  . Ranexa [Ranolazine] Other (See Comments)    "Made symptoms (heart racing and pain) worse"    Assessment/Plan:  1. Hyperlipidemia - LDL is above goal of <55. Will submit for prior authorization for Praluent. Ezetimibe will not get patient close to goal, therefore will go right to PCSK9i. Continue rosuvastatin '40mg'$  daily. Patient educated about limiting items likes butter and mayonnaise. Replacing them with oilve oil spray. And watching the sugar content and glycemic indexes of foods. I will call patient once a determination is made on Praluent. Will wait to submit until lipid panel results are available from today.   Thank you,  Ramond Dial, Pharm.D, BCPS, CPP [facility]  [address] [place], [place] [address]  Phone: [phone]; Fax: [phone]

## 2019-08-02 LAB — HEPATIC FUNCTION PANEL
ALT: 22 IU/L (ref 0–32)
AST: 20 IU/L (ref 0–40)
Albumin: 4 g/dL (ref 3.8–4.8)
Alkaline Phosphatase: 73 IU/L (ref 39–117)
Bilirubin Total: 0.4 mg/dL (ref 0.0–1.2)
Bilirubin, Direct: 0.11 mg/dL (ref 0.00–0.40)
Total Protein: 6.6 g/dL (ref 6.0–8.5)

## 2019-08-02 LAB — LIPID PANEL
Chol/HDL Ratio: 3.6 ratio (ref 0.0–4.4)
Cholesterol, Total: 129 mg/dL (ref 100–199)
HDL: 36 mg/dL — ABNORMAL LOW (ref >39)
LDL Chol Calc (NIH): 72 mg/dL (ref 0–99)
Triglycerides: 117 mg/dL (ref 0–149)
VLDL Cholesterol Cal: 21 mg/dL (ref 5–40)

## 2019-08-06 ENCOUNTER — Telehealth: Payer: Self-pay | Admitting: Pharmacist

## 2019-08-06 NOTE — Telephone Encounter (Signed)
Medicaid denied request for Praluent. Patient has not received letter in mail yet, therefore we do not know why it was denied. Will patient for letter to come. I have asked patient to call us when it comes so that we can determine why it was denied and have patient fill out form and drop off so we can appeal on her behalf.

## 2019-08-09 ENCOUNTER — Other Ambulatory Visit: Payer: Self-pay | Admitting: Interventional Cardiology

## 2019-08-12 DIAGNOSIS — M5030 Other cervical disc degeneration, unspecified cervical region: Secondary | ICD-10-CM | POA: Diagnosis not present

## 2019-08-12 DIAGNOSIS — M1990 Unspecified osteoarthritis, unspecified site: Secondary | ICD-10-CM | POA: Diagnosis not present

## 2019-08-12 DIAGNOSIS — M5136 Other intervertebral disc degeneration, lumbar region: Secondary | ICD-10-CM | POA: Diagnosis not present

## 2019-08-12 DIAGNOSIS — G894 Chronic pain syndrome: Secondary | ICD-10-CM | POA: Diagnosis not present

## 2019-08-26 NOTE — Telephone Encounter (Signed)
Left message for patient to call back. Calling to see if patient has received denial letter for Praluent. Will need her to sign form and drop off so we can appeal for her. Deadline to submit is coming up soon

## 2019-09-09 ENCOUNTER — Other Ambulatory Visit: Payer: Self-pay | Admitting: Interventional Cardiology

## 2019-09-09 DIAGNOSIS — M5030 Other cervical disc degeneration, unspecified cervical region: Secondary | ICD-10-CM | POA: Diagnosis not present

## 2019-09-09 DIAGNOSIS — M5136 Other intervertebral disc degeneration, lumbar region: Secondary | ICD-10-CM | POA: Diagnosis not present

## 2019-09-09 DIAGNOSIS — G894 Chronic pain syndrome: Secondary | ICD-10-CM | POA: Diagnosis not present

## 2019-10-06 ENCOUNTER — Other Ambulatory Visit: Payer: Self-pay | Admitting: Interventional Cardiology

## 2019-10-07 DIAGNOSIS — M5030 Other cervical disc degeneration, unspecified cervical region: Secondary | ICD-10-CM | POA: Diagnosis not present

## 2019-10-07 DIAGNOSIS — M5136 Other intervertebral disc degeneration, lumbar region: Secondary | ICD-10-CM | POA: Diagnosis not present

## 2019-10-07 DIAGNOSIS — G894 Chronic pain syndrome: Secondary | ICD-10-CM | POA: Diagnosis not present

## 2019-10-10 DIAGNOSIS — G894 Chronic pain syndrome: Secondary | ICD-10-CM | POA: Diagnosis not present

## 2019-11-04 DIAGNOSIS — M5030 Other cervical disc degeneration, unspecified cervical region: Secondary | ICD-10-CM | POA: Diagnosis not present

## 2019-11-04 DIAGNOSIS — M1990 Unspecified osteoarthritis, unspecified site: Secondary | ICD-10-CM | POA: Diagnosis not present

## 2019-11-04 DIAGNOSIS — G894 Chronic pain syndrome: Secondary | ICD-10-CM | POA: Diagnosis not present

## 2019-11-04 DIAGNOSIS — M5136 Other intervertebral disc degeneration, lumbar region: Secondary | ICD-10-CM | POA: Diagnosis not present

## 2019-12-01 DIAGNOSIS — G894 Chronic pain syndrome: Secondary | ICD-10-CM | POA: Diagnosis not present

## 2019-12-30 DIAGNOSIS — G894 Chronic pain syndrome: Secondary | ICD-10-CM | POA: Diagnosis not present

## 2019-12-30 DIAGNOSIS — M5030 Other cervical disc degeneration, unspecified cervical region: Secondary | ICD-10-CM | POA: Diagnosis not present

## 2019-12-30 DIAGNOSIS — M1990 Unspecified osteoarthritis, unspecified site: Secondary | ICD-10-CM | POA: Diagnosis not present

## 2019-12-30 DIAGNOSIS — M5136 Other intervertebral disc degeneration, lumbar region: Secondary | ICD-10-CM | POA: Diagnosis not present

## 2020-01-27 DIAGNOSIS — M1990 Unspecified osteoarthritis, unspecified site: Secondary | ICD-10-CM | POA: Diagnosis not present

## 2020-01-27 DIAGNOSIS — M5030 Other cervical disc degeneration, unspecified cervical region: Secondary | ICD-10-CM | POA: Diagnosis not present

## 2020-01-27 DIAGNOSIS — G894 Chronic pain syndrome: Secondary | ICD-10-CM | POA: Diagnosis not present

## 2020-01-30 DIAGNOSIS — G894 Chronic pain syndrome: Secondary | ICD-10-CM | POA: Diagnosis not present

## 2020-02-24 DIAGNOSIS — G894 Chronic pain syndrome: Secondary | ICD-10-CM | POA: Diagnosis not present

## 2020-02-24 DIAGNOSIS — M5136 Other intervertebral disc degeneration, lumbar region: Secondary | ICD-10-CM | POA: Diagnosis not present

## 2020-02-24 DIAGNOSIS — M1990 Unspecified osteoarthritis, unspecified site: Secondary | ICD-10-CM | POA: Diagnosis not present

## 2020-02-24 DIAGNOSIS — M5030 Other cervical disc degeneration, unspecified cervical region: Secondary | ICD-10-CM | POA: Diagnosis not present

## 2020-02-27 NOTE — Progress Notes (Signed)
Cardiology Office Note   Date:  02/28/2020   ID:  Brittney Banks, DOB 12-05-1970, MRN [id number]  PCP:  Ronita Hipps, MD  Cardiologist:  Dr. Tamala Julian    Chief Complaint  Patient presents with  . Coronary Artery Disease      History of Present Illness: Brittney Banks is a 49 y.o. female who presents for CAD and chest pain, Lt side and Rt neck pain.  She has a hx of hypertension, prior smoker, diabetes mellitus type 2, hyperlipidemia,, hirsutism, Behcet's disease, knownCAD withinferior STEMI treated with Onyx DES April 2018, recurring symptoms led to 2.25 Onyx DES of high-grade small LAD July 2018, subsequent recurrent pain January 2019 demonstrating total occlusion of the RCA with development of collaterals. Failed CTO attempt on the right coronary in July 2019.Marland KitchenFelt to be non-ideal forr CABG due to questionable targets and the source of pain.  Last seen 06/13/19 She continues to have trouble.  She still has tightness with exertion.  She has lost 40 pounds since last office visit.  She has not concerned about a new discomfort that occurs in the left shoulder and into the left scapula that hits, causing a grabbing type feeling, lasts 20 to 60 seconds and then resolves.  Random occurrence.  Not precipitated by physical activity.  Overall education and awareness concerning primary/secondary risk prevention was discussed in detail: LDL less than 70, hemoglobin A1c less than 7, blood pressure target less than 130/80 mmHg, >150 minutes of moderate aerobic activity per week, avoidance of smoking, weight control (via diet and exercise), and continued surveillance/management of/for obstructive sleep apnea.  Today she has been having Rt neck pain either from neck to wrist or wrist to neck.  She does have cervical disc disease with fusion.  Pain shrap shooting pain. Also with pain she had when she saw Dr. Tamala Julian though she has tried NTG with relief.  She is worried it is her heart.  No N&V no SOB.      Past Medical History:  Diagnosis Date  . Behcet's disease ([place])    "similiar to MS" (10/05/2017)  . Cellulitis 03/2016  . Chronic back pain    "all over"  . Chronic bronchitis ([place])   . Coronary artery disease    11/09/16 PCI with DES to mLAD, patent RCA stent  . DDD (degenerative disc disease), lumbar   . GERD (gastroesophageal reflux disease)   . Hyperlipidemia   . Hypertension   . LV dysfunction   . MI (myocardial infarction) ([place]) 10/2016  . Migraine    "quite a bit when I was younger; now maybe 1/year" (10/05/2017)  . MRSA carrier 2008  . MRSA infection 2008   BOTH LEGS  . Myocardial infarction involving right coronary artery ([place]) 08/15/2016  . On home oxygen therapy    "3L prn" (10/05/2017)  . Osteoarthritis    "in all my major bones" (10/05/2017)  . Pneumonia    "several times" (10/05/2017)  . Pyoderma gangrenosum    "skin autoimmune disorder" (10/05/2017)  . Seasonal allergies   . Type II diabetes mellitus ([place])    type 2    Past Surgical History:  Procedure Laterality Date  . ANTERIOR CERVICAL DECOMP/DISCECTOMY FUSION  2008  . BILATERAL CARPAL TUNNEL RELEASE Bilateral 2007  . CORONARY CTO INTERVENTION  10/05/2017  . CORONARY CTO INTERVENTION N/A 10/05/2017   Procedure: CORONARY CTO INTERVENTION;  Surgeon: Jettie Booze, MD;  Location: [facility];  Service: Cardiovascular;  Laterality: N/A;  .  CORONARY STENT INTERVENTION N/A 08/15/2016   Procedure: Coronary Stent Intervention;  Surgeon: Belva Crome, MD;  Location: [facility];  Service: Cardiovascular;  Laterality: N/A;  . CORONARY STENT INTERVENTION N/A 11/09/2016   Procedure: Coronary Stent Intervention;  Surgeon: Belva Crome, MD;  Location: [facility];  Service: Cardiovascular;  Laterality: N/A;  . ECTOPIC PREGNANCY SURGERY  X 3   "last time they took my tubes out"  . INCISION AND DRAINAGE     "had to clean MRSA off :RUE; 2 places on left inner leg; 1 place on back side of  my right leg" (10/05/2017)  . JOINT REPLACEMENT    . KNEE ARTHROSCOPY Right X 2  . LEFT HEART CATH AND CORONARY ANGIOGRAPHY N/A 08/15/2016   Procedure: Left Heart Cath and Coronary Angiography;  Surgeon: Belva Crome, MD;  Location: [facility];  Service: Cardiovascular;  Laterality: N/A;  . LEFT HEART CATH AND CORONARY ANGIOGRAPHY N/A 11/09/2016   Procedure: Left Heart Cath and Coronary Angiography;  Surgeon: Belva Crome, MD;  Location: [facility];  Service: Cardiovascular;  Laterality: N/A;  . LEFT HEART CATH AND CORONARY ANGIOGRAPHY N/A 04/19/2017   Procedure: LEFT HEART CATH AND CORONARY ANGIOGRAPHY;  Surgeon: Sherren Mocha, MD;  Location: [facility];  Service: Cardiovascular;  Laterality: N/A;  . TONSILLECTOMY    . TOTAL KNEE ARTHROPLASTY Right   . ULTRASOUND GUIDANCE FOR VASCULAR ACCESS  11/09/2016   Procedure: Ultrasound Guidance For Vascular Access;  Surgeon: Belva Crome, MD;  Location: [facility];  Service: Cardiovascular;;     Current Outpatient Medications  Medication Sig Dispense Refill  . carvedilol (COREG) 25 MG tablet Take 1 tablet (25 mg total) by mouth 2 (two) times daily. 180 tablet 3  . cetirizine (ZYRTEC) 10 MG tablet Take 10 mg by mouth daily.    . clobetasol ointment (TEMOVATE) 1.61 % Apply 1 application topically daily.  5  . clopidogrel (PLAVIX) 75 MG tablet Take 1 tablet (75 mg total) by mouth daily. Please make overdue appt with Dr. Tamala Julian before anymore refills. 1st attempt 90 tablet 3  . Dexlansoprazole 30 MG capsule Take 1 capsule (30 mg total) by mouth daily. 90 capsule 3  . glucose monitoring kit (FREESTYLE) monitoring kit 1 each by Does not apply route as needed for other. Dispense any model that is covered- dispense testing supplies for Q AC/ HS accuchecks- 1 month supply with one refil. 1 each 1  . insulin detemir (LEVEMIR) 100 UNIT/ML injection Inject 0.3 mLs (30 Units total) into the skin at bedtime. 10 mL 11  . isosorbide  mononitrate (IMDUR) 60 MG 24 hr tablet Take 2 tablets (120 mg total) by mouth daily. 180 tablet 3  . lisinopril (ZESTRIL) 20 MG tablet Take 1 tablet (20 mg total) by mouth daily. 90 tablet 3  . metFORMIN (GLUCOPHAGE) 850 MG tablet Take 850 mg by mouth 3 (three) times daily with meals.    Marland Kitchen morphine (MS CONTIN) 60 MG 12 hr tablet Take 60 mg by mouth every 8 (eight) hours.     Marland Kitchen neomycin-bacitracin-polymyxin (NEOSPORIN) ointment Apply 1 application topically as needed for wound care.    . nitroGLYCERIN (NITROSTAT) 0.4 MG SL tablet Place 1 tablet (0.4 mg total) under the tongue every 5 (five) minutes x 3 doses as needed for chest pain. 25 tablet 3  . Oxycodone HCl 20 MG TABS Take 20 mg by mouth every 4 (four) hours.     Marland Kitchen  oxymetazoline (AFRIN) 0.05 % nasal spray Place 1 spray into both nostrils daily as needed for congestion.    . rosuvastatin (CRESTOR) 40 MG tablet Take 1 tablet (40 mg total) by mouth daily. 90 tablet 3   No current facility-administered medications for this visit.    Allergies:   Anaprox [naproxen sodium], Aspirin, Brilinta [ticagrelor], Ceftin [cefuroxime axetil], Celexa [citalopram hydrobromide], Ciprofloxacin, Coconut fragrance, Contrast media [iodinated diagnostic agents], Cortisol [hydrocortisone], Dapsone, Elavil [amitriptyline], Hctz [hydrochlorothiazide], Imitrex [sumatriptan], Lexapro [escitalopram oxalate], Methadone, Metrizamide, Motrin [ibuprofen], Naprosyn [naproxen], Neurontin [gabapentin], Penicillins, Sulfa antibiotics, Sulfasalazine, Tagamet [cimetidine], Toradol [ketorolac tromethamine], Vancomycin, Zoloft [sertraline hcl], Colcrys [colchicine], Iodine, Lipitor [atorvastatin], and Ranexa [ranolazine]    Social History:  The patient  reports that she quit smoking about 9 years ago. Her smoking use included cigarettes. She has a 22.00 pack-year smoking history. She has never used smokeless tobacco. She reports previous alcohol use. She reports that she does not use  drugs.   Family History:  The patient's She was adopted. Family history is unknown by patient.    ROS:  General:no colds or fevers, + wt gain Skin:no rashes or ulcers HEENT:no blurred vision, no congestion CV:see HPI PUL:see HPI GI:no diarrhea constipation or melena, no indigestion GU:no hematuria, no dysuria MS:no joint pain, no claudication Neuro:no syncope, no lightheadedness Endo:+ diabetes doing well, no thyroid disease  Wt Readings from Last 3 Encounters:  02/28/20 265 lb 12.8 oz (120.6 kg)  06/13/19 247 lb 12.8 oz (112.4 kg)  05/29/18 282 lb 6.4 oz (128.1 kg)     PHYSICAL EXAM: VS:  BP 96/62   Pulse 88   Ht $R'5\' 9"'oc$  (1.753 m)   Wt 265 lb 12.8 oz (120.6 kg)   LMP  (LMP Unknown)   SpO2 98%   BMI 39.25 kg/m  , BMI Body mass index is 39.25 kg/m. General:Pleasant affect, NAD Skin:Warm and dry, brisk capillary refill HEENT:normocephalic, sclera clear, mucus membranes moist Neck:supple, no JVD, no bruits  + pain to palpation on Rt side feels like pain she was referring to.   Back upper back tenderness to palpation on Lt side as well Heart:S1S2 RRR without murmur, gallup, rub or click Lungs:clear without rales, rhonchi, or wheezes IRJ:JOAC, non tender, + BS, do not palpate liver spleen or masses Ext:no lower ext edema, 2+ pedal pulses, 2+ radial pulses Neuro:alert and oriented X 3, MAE, follows commands, + facial symmetry    EKG:  EKG is NOT ordered today.    Recent Labs: 06/13/2019: BUN 12; Creatinine, Ser 0.70; Hemoglobin 19.3; Platelets 321; Potassium 5.1; Sodium 137 08/01/2019: ALT 22    Lipid Panel    Component Value Date/Time   CHOL 129 08/01/2019 1331   TRIG 117 08/01/2019 1331   HDL 36 (L) 08/01/2019 1331   CHOLHDL 3.6 08/01/2019 1331   CHOLHDL 6.9 08/16/2016 0342   VLDL 24 08/16/2016 0342   LDLCALC 72 08/01/2019 1331       Other studies Reviewed: Additional studies/ records that were reviewed today include: . Echo 10/05/17  Study Conclusions    - Left ventricle: The cavity size was normal. Wall thickness was  normal. Systolic function was normal. The estimated ejection  fraction was in the range of 55% to 60%. Basal inferior  hypokinesis.  - Aortic valve: There was no stenosis.  - Right ventricle: The cavity size was normal. Systolic function  was normal.  - Pericardium, extracardiac: There was no pericardial effusion.   Impressions:   - Limited echo for pericardial effusion.  Cath for CTO 09/2017  Mid RCA to Dist RCA lesion is 100% stenosed.  Mid RCA lesion is 100% stenosed. Balloon angioplasty was performed.  Post intervention, there is a 100% residual stenosis.  Balloon angioplasty was performed.   Unable to open CTO.  Continue medical therapy.    ASSESSMENT AND PLAN:  1.  [place]/CAD with hx of MI in 2018 now with some Lt side pain responsive to NTG.  Will check nuc study, follow up in 2-3 weeks.   2.  Rt neck and arm pain related to nerve, muscualr skeletal pain. She will follow up with PCP or ortho MD  3. HLD on statin and has seen lipid clinic   4.  DM type 2 stable.  Had been losing weight   5.  Behcet's syndrome.  Stable.   6.  HTN controlled con't meds.    Current medicines are reviewed with the patient today.  The patient Has no concerns regarding medicines.  The following changes have been made:  See above Labs/ tests ordered today include:see above  Disposition:   FU:  see above  Signed, Cecilie Kicks, NP  02/28/2020 1:32 PM    [facility] [place], [place], [place], [place] Phone: [phone]; Fax: [phone]

## 2020-02-28 ENCOUNTER — Encounter: Payer: Self-pay | Admitting: Cardiology

## 2020-02-28 ENCOUNTER — Ambulatory Visit (INDEPENDENT_AMBULATORY_CARE_PROVIDER_SITE_OTHER): Payer: Medicare Other | Admitting: Cardiology

## 2020-02-28 ENCOUNTER — Other Ambulatory Visit: Payer: Self-pay

## 2020-02-28 VITALS — BP 96/62 | HR 88 | Ht 69.0 in | Wt 265.8 lb

## 2020-02-28 DIAGNOSIS — M352 Behcet's disease: Secondary | ICD-10-CM

## 2020-02-28 DIAGNOSIS — E1169 Type 2 diabetes mellitus with other specified complication: Secondary | ICD-10-CM | POA: Diagnosis not present

## 2020-02-28 DIAGNOSIS — I25119 Atherosclerotic heart disease of native coronary artery with unspecified angina pectoris: Secondary | ICD-10-CM

## 2020-02-28 DIAGNOSIS — I200 Unstable angina: Secondary | ICD-10-CM | POA: Diagnosis not present

## 2020-02-28 DIAGNOSIS — E669 Obesity, unspecified: Secondary | ICD-10-CM

## 2020-02-28 DIAGNOSIS — E782 Mixed hyperlipidemia: Secondary | ICD-10-CM | POA: Diagnosis not present

## 2020-02-28 MED ORDER — NITROGLYCERIN 0.4 MG SL SUBL: 0.4000 mg | SUBLINGUAL_TABLET | SUBLINGUAL | 3 refills | Status: DC | PRN

## 2020-02-28 MED ORDER — LISINOPRIL 20 MG PO TABS
20.0000 mg | ORAL_TABLET | Freq: Every day | ORAL | 3 refills | Status: DC
Start: 2020-02-28 — End: 2021-03-10

## 2020-02-28 MED ORDER — ISOSORBIDE MONONITRATE ER 60 MG PO TB24
120.0000 mg | ORAL_TABLET | Freq: Every day | ORAL | 3 refills | Status: DC
Start: 2020-02-28 — End: 2021-03-10

## 2020-02-28 MED ORDER — ROSUVASTATIN CALCIUM 40 MG PO TABS: 40.0000 mg | ORAL_TABLET | Freq: Every day | ORAL | 3 refills | Status: DC

## 2020-02-28 MED ORDER — DEXLANSOPRAZOLE 30 MG PO CPDR
30.0000 mg | DELAYED_RELEASE_CAPSULE | Freq: Every day | ORAL | 3 refills | Status: DC
Start: 2020-02-28 — End: 2021-03-10

## 2020-02-28 MED ORDER — CARVEDILOL 25 MG PO TABS
ORAL_TABLET | ORAL | 3 refills | Status: DC
Start: 2020-02-28 — End: 2021-03-10

## 2020-02-28 NOTE — Patient Instructions (Addendum)
Medication Instructions:  Your physician recommends that you continue on your current medications as directed. Please refer to the Current Medication list given to you today.  *If you need a refill on your cardiac medications before your next appointment, please call your pharmacy*   Lab Work: None ordered  If you have labs (blood work) drawn today and your tests are completely normal, you will receive your results only by: Marland Kitchen MyChart Message (if you have MyChart) OR . A paper copy in the mail If you have any lab test that is abnormal or we need to change your treatment, we will call you to review the results.   Testing/Procedures: Your physician has requested that you have a lexiscan myoview. For further information please visit [email or web address] Please follow instruction sheet BELOW:    You are scheduled for a Myocardial Perfusion Imaging Study   Please arrive 15 minutes prior to your appointment time for registration and insurance purposes.  The test will take approximately 3 to 4 hours to complete; you may bring reading material.  If someone comes with you to your appointment, they will need to remain in the main lobby due to limited space in the testing area. **If you are pregnant or breastfeeding, please notify the nuclear lab prior to your appointment**  How to prepare for your Myocardial Perfusion Test: . Do not eat or drink 3 hours prior to your test, except you may have water. . Do not consume products containing caffeine (regular or decaffeinated) 12 hours prior to your test. (ex: coffee, chocolate, sodas, tea). Do bring a list of your current medications with you.  If not listed below, you may take your medications as normal. . Do not take carvedilol (Coreg) for 24 hours prior to the test.  Bring the medication to your appointment as you may be required to take it once the test is complete. . Do not take your insulin or metformin the morning of your test.  You can take it  afterwards, when you are able to eat a meal  . Do wear comfortable clothes (no dresses or overalls) and walking shoes, tennis shoes preferred (No heels or open toe shoes are allowed). . Do NOT wear cologne, perfume, aftershave, or lotions (deodorant is allowed). . If these instructions are not followed, your test will have to be rescheduled.     Follow-Up: At [facility], you and your health needs are our priority.  As part of our continuing mission to provide you with exceptional heart care, we have created designated Provider Care Teams.  These Care Teams include your primary Cardiologist (physician) and Advanced Practice Providers (APPs -  Physician Assistants and Nurse Practitioners) who all work together to provide you with the care you need, when you need it.  We recommend signing up for the patient portal called "MyChart".  Sign up information is provided on this After Visit Summary.  MyChart is used to connect with patients for Virtual Visits (Telemedicine).  Patients are able to view lab/test results, encounter notes, upcoming appointments, etc.  Non-urgent messages can be sent to your provider as well.   To learn more about what you can do with MyChart, go to [email or web address].    Your next appointment:   2-3 WEEKS   03/18/2020 ARRIVE AT 10:30   The format for your next appointment:   In Person  Provider:   You may see Lesleigh Noe, MD or one of the following Advanced Practice Providers on your designated Care  Team:    Norma Fredrickson, NP  Nada Boozer, NP  Georgie Chard, NP    Other Instructions

## 2020-02-29 DIAGNOSIS — G894 Chronic pain syndrome: Secondary | ICD-10-CM | POA: Diagnosis not present

## 2020-03-04 ENCOUNTER — Telehealth (HOSPITAL_COMMUNITY): Payer: Self-pay | Admitting: *Deleted

## 2020-03-04 NOTE — Telephone Encounter (Signed)
Left message on voicemail per DPR in reference to upcoming appointment scheduled on 03/09/20 at 1:15 with detailed instructions given per Myocardial Perfusion Study Information Sheet for the test. LM to arrive 15 minutes early, and that it is imperative to arrive on time for appointment to keep from having the test rescheduled. If you need to cancel or reschedule your appointment, please call the office within 24 hours of your appointment. Failure to do so may result in a cancellation of your appointment, and a $50 no show fee. Phone number given for call back for any questions.

## 2020-03-04 NOTE — Progress Notes (Deleted)
CARDIOLOGY OFFICE NOTE  Date:  03/04/2020    Brittney Banks Date of Birth: [birth year] Medical Record #[id number]  PCP:  Marylen Ponto, MD  Cardiologist:  Princella Ion chief complaint on file.   History of Present Illness: Brittney Banks is a 49 y.o. female who presents today for a *** who presents for CAD and chest pain, Lt side and Rt neck pain.  She has a hx of hypertension, prior smoker, diabetes mellitus type 2, hyperlipidemia,, hirsutism, Behcet's disease, knownCAD withinferior STEMI treated with Onyx DES April 2018, recurring symptoms led to 2.25 Onyx DES of high-grade small LAD July 2018, subsequent recurrent pain January 2019 demonstrating total occlusion of the RCA with development of collaterals. Failed CTO attempt on the right coronary in July 2019.Marland KitchenFelt to be non-ideal forr CABG due to questionable targets and the source of pain.  Last seen 06/13/19 She continues to have trouble. She still has tightness with exertion. She has lost 40 pounds since last office visit. She has not concerned about a new discomfort that occurs in the left shoulder and into the left scapula that hits, causing a grabbing type feeling, lasts 20 to 60 seconds and then resolves. Random occurrence. Not precipitated by physical activity.  Overall education and awareness concerning primary/secondary risk prevention was discussed in detail: LDL less than 70, hemoglobin A1c less than 7, blood pressure target less than 130/80 mmHg, >150 minutes of moderate aerobic activity per week, avoidance of smoking, weight control (via diet and exercise), and continued surveillance/management of/for obstructive sleep apnea.  Today she has been having Rt neck pain either from neck to wrist or wrist to neck.  She does have cervical disc disease with fusion.  Pain shrap shooting pain. Also with pain she had when she saw Dr. Katrinka Blazing though she has tried NTG with relief.  She is worried it is her heart.  No N&V no SOB.      Comes in today. Here with   Past Medical History:  Diagnosis Date  . Behcet's disease (HCC)    "similiar to MS" (10/05/2017)  . Cellulitis 03/2016  . Chronic back pain    "all over"  . Chronic bronchitis (HCC)   . Coronary artery disease    11/09/16 PCI with DES to mLAD, patent RCA stent  . DDD (degenerative disc disease), lumbar   . GERD (gastroesophageal reflux disease)   . Hyperlipidemia   . Hypertension   . LV dysfunction   . MI (myocardial infarction) (HCC) 10/2016  . Migraine    "quite a bit when I was younger; now maybe 1/year" (10/05/2017)  . MRSA carrier 2008  . MRSA infection 2008   BOTH LEGS  . Myocardial infarction involving right coronary artery (HCC) 08/15/2016  . On home oxygen therapy    "3L prn" (10/05/2017)  . Osteoarthritis    "in all my major bones" (10/05/2017)  . Pneumonia    "several times" (10/05/2017)  . Pyoderma gangrenosum    "skin autoimmune disorder" (10/05/2017)  . Seasonal allergies   . Type II diabetes mellitus (HCC)    type 2    Past Surgical History:  Procedure Laterality Date  . ANTERIOR CERVICAL DECOMP/DISCECTOMY FUSION  2008  . BILATERAL CARPAL TUNNEL RELEASE Bilateral 2007  . CORONARY CTO INTERVENTION  10/05/2017  . CORONARY CTO INTERVENTION N/A 10/05/2017   Procedure: CORONARY CTO INTERVENTION;  Surgeon: Corky Crafts, MD;  Location: [facility] INVASIVE CV LAB;  Service: Cardiovascular;  Laterality: N/A;  .  CORONARY STENT INTERVENTION N/A 08/15/2016   Procedure: Coronary Stent Intervention;  Surgeon: Lyn Records, MD;  Location: [facility] INVASIVE CV LAB;  Service: Cardiovascular;  Laterality: N/A;  . CORONARY STENT INTERVENTION N/A 11/09/2016   Procedure: Coronary Stent Intervention;  Surgeon: Lyn Records, MD;  Location: [facility] INVASIVE CV LAB;  Service: Cardiovascular;  Laterality: N/A;  . ECTOPIC PREGNANCY SURGERY  X 3   "last time they took my tubes out"  . INCISION AND DRAINAGE     "had to clean MRSA off :RUE; 2 places on left inner  leg; 1 place on back side of my right leg" (10/05/2017)  . JOINT REPLACEMENT    . KNEE ARTHROSCOPY Right X 2  . LEFT HEART CATH AND CORONARY ANGIOGRAPHY N/A 08/15/2016   Procedure: Left Heart Cath and Coronary Angiography;  Surgeon: Lyn Records, MD;  Location: [facility] INVASIVE CV LAB;  Service: Cardiovascular;  Laterality: N/A;  . LEFT HEART CATH AND CORONARY ANGIOGRAPHY N/A 11/09/2016   Procedure: Left Heart Cath and Coronary Angiography;  Surgeon: Lyn Records, MD;  Location: [facility] INVASIVE CV LAB;  Service: Cardiovascular;  Laterality: N/A;  . LEFT HEART CATH AND CORONARY ANGIOGRAPHY N/A 04/19/2017   Procedure: LEFT HEART CATH AND CORONARY ANGIOGRAPHY;  Surgeon: Tonny Bollman, MD;  Location: [facility] INVASIVE CV LAB;  Service: Cardiovascular;  Laterality: N/A;  . TONSILLECTOMY    . TOTAL KNEE ARTHROPLASTY Right   . ULTRASOUND GUIDANCE FOR VASCULAR ACCESS  11/09/2016   Procedure: Ultrasound Guidance For Vascular Access;  Surgeon: Lyn Records, MD;  Location: [facility] INVASIVE CV LAB;  Service: Cardiovascular;;     Medications: No outpatient medications have been marked as taking for the 03/18/20 encounter (Appointment) with Rosalio Macadamia, NP.     Allergies: Allergies  Allergen Reactions  . Anaprox [Naproxen Sodium] Shortness Of Breath and Other (See Comments)    Redness of skin and difficulty breathing   . Aspirin Nausea And Vomiting and Other (See Comments)    Severe cramping, vomiting   . Brilinta [Ticagrelor] Shortness Of Breath  . Ceftin [Cefuroxime Axetil] Shortness Of Breath and Other (See Comments)    Skin turns red   . Celexa [Citalopram Hydrobromide] Shortness Of Breath  . Ciprofloxacin Shortness Of Breath and Other (See Comments)    Skin turns red   . Coconut Fragrance Hives, Shortness Of Breath, Swelling and Other (See Comments)    Reaction to any kind of coconut Mouth and tongue swelling  . Contrast Media [Iodinated Diagnostic Agents] Shortness Of Breath    Breathing  difficulty  . Cortisol [Hydrocortisone] Shortness Of Breath    Breathing difficulty and redness of skin  . Dapsone Nausea And Vomiting    Extreme muscle weakness  . Elavil [Amitriptyline] Shortness Of Breath     Redness of skin and blisters of skin Difficulty breathing  . Hctz [Hydrochlorothiazide] Shortness Of Breath  . Imitrex [Sumatriptan] Shortness Of Breath    Redness and difficulty breathing   . Lexapro [Escitalopram Oxalate] Shortness Of Breath    Breathing difficulty Redness to skin  . Methadone Nausea And Vomiting    Severely vomiting   . Metrizamide Shortness Of Breath    Breathing difficulty  . Motrin [Ibuprofen] Nausea And Vomiting  . Naprosyn [Naproxen] Shortness Of Breath    Breathing difficulty Redness of skin  . Neurontin [Gabapentin] Shortness Of Breath    Breathing difficulty  Redness to skin Breathing difficulty  Redness to skin  . Penicillins Shortness Of Breath and  Rash    Has patient had a PCN reaction causing immediate rash, facial/tongue/throat swelling, SOB or lightheadedness with hypotension: Yes Has patient had a PCN reaction causing severe rash involving mucus membranes or skin necrosis: Yes Has patient had a PCN reaction that required hospitalization: Yes Has patient had a PCN reaction occurring within the last 10 years: No If all of the above answers are "NO", then may proceed with Cephalosporin use.   . Sulfa Antibiotics Shortness Of Breath    Severe breathing difficulty And redness of skin  . Sulfasalazine Shortness Of Breath    Severe breathing difficulty And redness of skin  . Tagamet [Cimetidine] Nausea And Vomiting  . Toradol [Ketorolac Tromethamine] Shortness Of Breath    Redness to skin  . Vancomycin Rash    Patient Confirms Red mans syndome type rxn - Tolerated a slow infusion in past  . Zoloft [Sertraline Hcl] Shortness Of Breath and Other (See Comments)    Breathing difficulty Redness to skin  . Colcrys [Colchicine] Nausea  And Vomiting  . Iodine Swelling and Other (See Comments)    Redness to skin  . Lipitor [Atorvastatin] Diarrhea  . Ranexa [Ranolazine] Other (See Comments)    "Made symptoms (heart racing and pain) worse"    Social History: The patient  reports that she quit smoking about 9 years ago. Her smoking use included cigarettes. She has a 22.00 pack-year smoking history. She has never used smokeless tobacco. She reports previous alcohol use. She reports that she does not use drugs.   Family History: The patient's ***She was adopted. Family history is unknown by patient.   Review of Systems: Please see the history of present illness.   All other systems are reviewed and negative.   Physical Exam: VS:  LMP  (LMP Unknown)  .  BMI There is no height or weight on file to calculate BMI.  Wt Readings from Last 3 Encounters:  02/28/20 265 lb 12.8 oz (120.6 kg)  06/13/19 247 lb 12.8 oz (112.4 kg)  05/29/18 282 lb 6.4 oz (128.1 kg)    General: Pleasant. Well developed, well nourished and in no acute distress.   HEENT: Normal.  Neck: Supple, no JVD, carotid bruits, or masses noted.  Cardiac: ***Regular rate and rhythm. No murmurs, rubs, or gallops. No edema.  Respiratory:  Lungs are clear to auscultation bilaterally with normal work of breathing.  GI: Soft and nontender.  MS: No deformity or atrophy. Gait and ROM intact.  Skin: Warm and dry. Color is normal.  Neuro:  Strength and sensation are intact and no gross focal deficits noted.  Psych: Alert, appropriate and with normal affect.   LABORATORY DATA:  EKG:  EKG {ACTION; IS/IS PIR:[id number]} ordered today.  Personally reviewed by me. This demonstrates ***.  Lab Results  Component Value Date   WBC 14.9 (H) 06/13/2019   HGB 19.3 (H) 06/13/2019   HCT 53.7 (H) 06/13/2019   PLT 321 06/13/2019   GLUCOSE 230 (H) 06/13/2019   CHOL 129 08/01/2019   TRIG 117 08/01/2019   HDL 36 (L) 08/01/2019   LDLCALC 72 08/01/2019   ALT 22 08/01/2019   AST  20 08/01/2019   NA 137 06/13/2019   K 5.1 06/13/2019   CL 99 06/13/2019   CREATININE 0.70 06/13/2019   BUN 12 06/13/2019   CO2 21 06/13/2019   TSH 0.504 08/15/2016   INR 1.07 04/19/2017   HGBA1C 8.4 (H) 06/13/2019     BNP (last 3 results) No results for  input(s): BNP in the last 8760 hours.  ProBNP (last 3 results) No results for input(s): PROBNP in the last 8760 hours.   Other Studies Reviewed Today:   Assessment/Plan:  Echo 10/05/17  Study Conclusions   - Left ventricle: The cavity size was normal. Wall thickness was  normal. Systolic function was normal. The estimated ejection  fraction was in the range of 55% to 60%. Basal inferior  hypokinesis.  - Aortic valve: There was no stenosis.  - Right ventricle: The cavity size was normal. Systolic function  was normal.  - Pericardium, extracardiac: There was no pericardial effusion.   Impressions:   - Limited echo for pericardial effusion.   Cath for CTO 09/2017  Mid RCA to Dist RCA lesion is 100% stenosed.  Mid RCA lesion is 100% stenosed. Balloon angioplasty was performed.  Post intervention, there is a 100% residual stenosis.  Balloon angioplasty was performed.  Unable to open CTO. Continue medical therapy.   ASSESSMENT AND PLAN:  1.  USA/CAD with hx of MI in 2018 now with some Lt side pain responsive to NTG.  Will check nuc study, follow up in 2-3 weeks.   2.  Rt neck and arm pain related to nerve, muscualr skeletal pain. She will follow up with PCP or ortho MD  3. HLD on statin and has seen lipid clinic   4.  DM type 2 stable.  Had been losing weight   5.  Behcet's syndrome.  Stable.   6.  HTN controlled con't meds.  Current medicines are reviewed with the patient today.  The patient does not have concerns regarding medicines other than what has been noted above.  The following changes have been made:  See above.  Labs/ tests ordered today include:   No orders of the defined  types were placed in this encounter.    Disposition:   FU with *** in {gen number 1-19:[id number]} {Days to years:10300}.   Patient is agreeable to this plan and will call if any problems develop in the interim.   SignedNorma Fredrickson, NP  03/04/2020 7:47 AM  [facility] [address] [place], [place]  [address] Phone: [phone] Fax: [phone]

## 2020-03-09 ENCOUNTER — Ambulatory Visit (HOSPITAL_COMMUNITY): Payer: Medicare Other

## 2020-03-10 ENCOUNTER — Telehealth (HOSPITAL_COMMUNITY): Payer: Self-pay | Admitting: *Deleted

## 2020-03-10 ENCOUNTER — Ambulatory Visit (HOSPITAL_COMMUNITY): Payer: Medicare Other

## 2020-03-10 NOTE — Telephone Encounter (Signed)
Patient given detailed instructions per Myocardial Perfusion Study Information Sheet for the test on 03/18/2020 at 1315. Patient notified to arrive 15 minutes early and that it is imperative to arrive on time for appointment to keep from having the test rescheduled.  If you need to cancel or reschedule your appointment, please call the office within 24 hours of your appointment. . Patient verbalized understanding.Brittney Banks, Brittney Banks  No mychart available.

## 2020-03-18 ENCOUNTER — Ambulatory Visit: Payer: Medicare Other | Admitting: Nurse Practitioner

## 2020-03-18 ENCOUNTER — Ambulatory Visit (HOSPITAL_COMMUNITY): Payer: Medicare Other | Attending: Cardiology

## 2020-03-18 ENCOUNTER — Other Ambulatory Visit: Payer: Self-pay

## 2020-03-18 DIAGNOSIS — I200 Unstable angina: Secondary | ICD-10-CM

## 2020-03-18 MED ORDER — REGADENOSON 0.4 MG/5ML IV SOLN
0.4000 mg | Freq: Once | INTRAVENOUS | Status: AC
  Administered 2020-03-18: 0.4 mg via INTRAVENOUS

## 2020-03-18 MED ORDER — TECHNETIUM TC 99M TETROFOSMIN IV KIT
31.4000 | PACK | Freq: Once | INTRAVENOUS | Status: AC | PRN
  Administered 2020-03-18: 31.4 via INTRAVENOUS
  Filled 2020-03-18: qty 32

## 2020-03-19 ENCOUNTER — Ambulatory Visit (HOSPITAL_COMMUNITY): Payer: Medicare Other | Attending: Cardiology

## 2020-03-19 ENCOUNTER — Other Ambulatory Visit: Payer: Self-pay

## 2020-03-19 ENCOUNTER — Telehealth: Payer: Self-pay | Admitting: Interventional Cardiology

## 2020-03-19 LAB — MYOCARDIAL PERFUSION IMAGING
LV dias vol: 85 mL (ref 46–106)
LV sys vol: 33 mL
Peak HR: 106 {beats}/min
Rest HR: 88 {beats}/min
SDS: 12
SRS: 3
SSS: 15
TID: 0.98

## 2020-03-19 MED ORDER — TECHNETIUM TC 99M TETROFOSMIN IV KIT
32.0000 | PACK | Freq: Once | INTRAVENOUS | Status: AC | PRN
  Administered 2020-03-19: 32 via INTRAVENOUS
  Filled 2020-03-19: qty 32

## 2020-03-19 NOTE — Telephone Encounter (Signed)
Patient is returning call to discuss Lexiscan results. Please call.

## 2020-03-20 ENCOUNTER — Telehealth: Payer: Self-pay | Admitting: Interventional Cardiology

## 2020-03-20 NOTE — Telephone Encounter (Signed)
Pt called back in to see if I had heard back from Nada Boozer, NP for earlier this morning. Pt was advised that Vernona Rieger was not in the office today, as was mentioned earlier this morning, and advised her as previously, if she is having recurring symptoms, we can change her appt to earlier than 04/21/20 or she can go to the ED if she feels it's urgent enough, as pt's stress test was fine. Pt advised that she just seems to think it may be a "allergic reaction flare-up", and hope it passes soon. Pt advised if it didn't to go to the ED and get evaluation. Pt verbalized understanding and thanked me for taking her call.

## 2020-03-20 NOTE — Telephone Encounter (Signed)
-----   Message from Leone Brand, NP sent at 03/19/2020  4:09 PM EST ----- Good news no lack of blood supply on stress test.  Keep follow up appt. If recurrent symptoms call to be seen earlier.

## 2020-03-20 NOTE — Telephone Encounter (Signed)
Pt has been made aware of her stress test results.  She states that every since she had the stress test done, she has had chest pain, and the Imdur  isn't helping as it did before the stress test.   Pt advised I would send a message to Nada Boozer, NP and will call her back with recommendations.  She verbalized understanding and was appreciative of the call back.

## 2020-03-20 NOTE — Telephone Encounter (Signed)
Returned call to pt, left a message for her to call back. 

## 2020-03-20 NOTE — Telephone Encounter (Signed)
Patient calling back.   °

## 2020-03-24 DIAGNOSIS — M5030 Other cervical disc degeneration, unspecified cervical region: Secondary | ICD-10-CM | POA: Diagnosis not present

## 2020-03-24 DIAGNOSIS — M1990 Unspecified osteoarthritis, unspecified site: Secondary | ICD-10-CM | POA: Diagnosis not present

## 2020-03-24 DIAGNOSIS — M5136 Other intervertebral disc degeneration, lumbar region: Secondary | ICD-10-CM | POA: Diagnosis not present

## 2020-03-24 DIAGNOSIS — G894 Chronic pain syndrome: Secondary | ICD-10-CM | POA: Diagnosis not present

## 2020-03-26 DIAGNOSIS — Z0000 Encounter for general adult medical examination without abnormal findings: Secondary | ICD-10-CM | POA: Diagnosis not present

## 2020-03-26 DIAGNOSIS — Z1331 Encounter for screening for depression: Secondary | ICD-10-CM | POA: Diagnosis not present

## 2020-03-26 DIAGNOSIS — R200 Anesthesia of skin: Secondary | ICD-10-CM | POA: Diagnosis not present

## 2020-03-26 DIAGNOSIS — Z6837 Body mass index (BMI) 37.0-37.9, adult: Secondary | ICD-10-CM | POA: Diagnosis not present

## 2020-03-26 DIAGNOSIS — Z23 Encounter for immunization: Secondary | ICD-10-CM | POA: Diagnosis not present

## 2020-03-27 NOTE — Telephone Encounter (Signed)
The only abnormality on the scan is in the distribution of the known totally occluded right coronary.  All else looks good.

## 2020-03-30 DIAGNOSIS — G894 Chronic pain syndrome: Secondary | ICD-10-CM | POA: Diagnosis not present

## 2020-04-07 NOTE — Progress Notes (Deleted)
CARDIOLOGY OFFICE NOTE  Date:  04/20/2020    Brittney Banks Date of Birth: 1970/05/26 Medical Record #[id number]  PCP:  Marylen Ponto, MD  Cardiologist:  Princella Ion chief complaint on file.   History of Present Illness: Brittney Banks is a 49 y.o. female who presents today for a 2 month check. Seen for Dr. Katrinka Blazing.   She has a history of hypertension, prior smoker, diabetes mellitus type 2, hyperlipidemia,, hirsutism, Behcet's disease, known CAD with inferior STEMI treated with Onyx DES April 2018, recurring symptoms led to 2.25 Onyx DES of high-grade small LAD July 2018, subsequent recurrent pain January 2019 demonstrating total occlusion of the RCA with development of collaterals.  Failed CTO attempt on the right coronary in July 2019. Felt to be non-ideal for CABG due to questionable targets and the source of pain.  She was last seen by Dr. Katrinka Blazing in February of 2021 - still with chest tightness with exertion. Had lost 40 pounds.   Saw Nada Boozer, NP in mid November - some neck pain. Still with chest pain. Myoview was done.   Comes in today. Here with   Past Medical History:  Diagnosis Date  . Behcet's disease (HCC)    "similiar to MS" (10/05/2017)  . Cellulitis 03/2016  . Chronic back pain    "all over"  . Chronic bronchitis (HCC)   . Coronary artery disease    11/09/16 PCI with DES to mLAD, patent RCA stent  . DDD (degenerative disc disease), lumbar   . GERD (gastroesophageal reflux disease)   . Hyperlipidemia   . Hypertension   . LV dysfunction   . MI (myocardial infarction) (HCC) 10/2016  . Migraine    "quite a bit when I was younger; now maybe 1/year" (10/05/2017)  . MRSA carrier 2008  . MRSA infection 2008   BOTH LEGS  . Myocardial infarction involving right coronary artery (HCC) 08/15/2016  . On home oxygen therapy    "3L prn" (10/05/2017)  . Osteoarthritis    "in all my major bones" (10/05/2017)  . Pneumonia    "several times" (10/05/2017)  . Pyoderma  gangrenosum    "skin autoimmune disorder" (10/05/2017)  . Seasonal allergies   . Type II diabetes mellitus (HCC)    type 2    Past Surgical History:  Procedure Laterality Date  . ANTERIOR CERVICAL DECOMP/DISCECTOMY FUSION  2008  . BILATERAL CARPAL TUNNEL RELEASE Bilateral 2007  . CORONARY CTO INTERVENTION  10/05/2017  . CORONARY CTO INTERVENTION N/A 10/05/2017   Procedure: CORONARY CTO INTERVENTION;  Surgeon: Corky Crafts, MD;  Location: [facility] INVASIVE CV LAB;  Service: Cardiovascular;  Laterality: N/A;  . CORONARY STENT INTERVENTION N/A 08/15/2016   Procedure: Coronary Stent Intervention;  Surgeon: Lyn Records, MD;  Location: [facility] INVASIVE CV LAB;  Service: Cardiovascular;  Laterality: N/A;  . CORONARY STENT INTERVENTION N/A 11/09/2016   Procedure: Coronary Stent Intervention;  Surgeon: Lyn Records, MD;  Location: [facility] INVASIVE CV LAB;  Service: Cardiovascular;  Laterality: N/A;  . ECTOPIC PREGNANCY SURGERY  X 3   "last time they took my tubes out"  . INCISION AND DRAINAGE     "had to clean MRSA off :RUE; 2 places on left inner leg; 1 place on back side of my right leg" (10/05/2017)  . JOINT REPLACEMENT    . KNEE ARTHROSCOPY Right X 2  . LEFT HEART CATH AND CORONARY ANGIOGRAPHY N/A 08/15/2016   Procedure: Left Heart Cath and  Coronary Angiography;  Surgeon: Lyn Records, MD;  Location: [facility] INVASIVE CV LAB;  Service: Cardiovascular;  Laterality: N/A;  . LEFT HEART CATH AND CORONARY ANGIOGRAPHY N/A 11/09/2016   Procedure: Left Heart Cath and Coronary Angiography;  Surgeon: Lyn Records, MD;  Location: [facility] INVASIVE CV LAB;  Service: Cardiovascular;  Laterality: N/A;  . LEFT HEART CATH AND CORONARY ANGIOGRAPHY N/A 04/19/2017   Procedure: LEFT HEART CATH AND CORONARY ANGIOGRAPHY;  Surgeon: Tonny Bollman, MD;  Location: [facility] INVASIVE CV LAB;  Service: Cardiovascular;  Laterality: N/A;  . TONSILLECTOMY    . TOTAL KNEE ARTHROPLASTY Right   . ULTRASOUND GUIDANCE FOR VASCULAR ACCESS  11/09/2016    Procedure: Ultrasound Guidance For Vascular Access;  Surgeon: Lyn Records, MD;  Location: [facility] INVASIVE CV LAB;  Service: Cardiovascular;;     Medications: No outpatient medications have been marked as taking for the 04/21/20 encounter (Appointment) with Rosalio Macadamia, NP.     Allergies: Allergies  Allergen Reactions  . Anaprox [Naproxen Sodium] Shortness Of Breath and Other (See Comments)    Redness of skin and difficulty breathing   . Aspirin Nausea And Vomiting and Other (See Comments)    Severe cramping, vomiting   . Brilinta [Ticagrelor] Shortness Of Breath  . Ceftin [Cefuroxime Axetil] Shortness Of Breath and Other (See Comments)    Skin turns red   . Celexa [Citalopram Hydrobromide] Shortness Of Breath  . Ciprofloxacin Shortness Of Breath and Other (See Comments)    Skin turns red   . Coconut Fragrance Hives, Shortness Of Breath, Swelling and Other (See Comments)    Reaction to any kind of coconut Mouth and tongue swelling  . Contrast Media [Iodinated Diagnostic Agents] Shortness Of Breath    Breathing difficulty  . Cortisol [Hydrocortisone] Shortness Of Breath    Breathing difficulty and redness of skin  . Dapsone Nausea And Vomiting    Extreme muscle weakness  . Elavil [Amitriptyline] Shortness Of Breath     Redness of skin and blisters of skin Difficulty breathing  . Hctz [Hydrochlorothiazide] Shortness Of Breath  . Imitrex [Sumatriptan] Shortness Of Breath    Redness and difficulty breathing   . Lexapro [Escitalopram Oxalate] Shortness Of Breath    Breathing difficulty Redness to skin  . Methadone Nausea And Vomiting    Severely vomiting   . Metrizamide Shortness Of Breath    Breathing difficulty  . Motrin [Ibuprofen] Nausea And Vomiting  . Naprosyn [Naproxen] Shortness Of Breath    Breathing difficulty Redness of skin  . Neurontin [Gabapentin] Shortness Of Breath    Breathing difficulty  Redness to skin Breathing difficulty  Redness to skin  .  Penicillins Shortness Of Breath and Rash    Has patient had a PCN reaction causing immediate rash, facial/tongue/throat swelling, SOB or lightheadedness with hypotension: Yes Has patient had a PCN reaction causing severe rash involving mucus membranes or skin necrosis: Yes Has patient had a PCN reaction that required hospitalization: Yes Has patient had a PCN reaction occurring within the last 10 years: No If all of the above answers are "NO", then may proceed with Cephalosporin use.   . Sulfa Antibiotics Shortness Of Breath    Severe breathing difficulty And redness of skin  . Sulfasalazine Shortness Of Breath    Severe breathing difficulty And redness of skin  . Tagamet [Cimetidine] Nausea And Vomiting  . Toradol [Ketorolac Tromethamine] Shortness Of Breath    Redness to skin  . Vancomycin Rash    Patient Confirms  Red mans syndome type rxn - Tolerated a slow infusion in past  . Zoloft [Sertraline Hcl] Shortness Of Breath and Other (See Comments)    Breathing difficulty Redness to skin  . Colcrys [Colchicine] Nausea And Vomiting  . Iodine Swelling and Other (See Comments)    Redness to skin  . Lipitor [Atorvastatin] Diarrhea  . Ranexa [Ranolazine] Other (See Comments)    "Made symptoms (heart racing and pain) worse"    Social History: The patient  reports that she quit smoking about 9 years ago. Her smoking use included cigarettes. She has a 22.00 pack-year smoking history. She has never used smokeless tobacco. She reports previous alcohol use. She reports that she does not use drugs.   Family History: The patient's ***She was adopted. Family history is unknown by patient.   Review of Systems: Please see the history of present illness.   All other systems are reviewed and negative.   Physical Exam: VS:  LMP  (LMP Unknown)  .  BMI There is no height or weight on file to calculate BMI.  Wt Readings from Last 3 Encounters:  03/18/20 265 lb (120.2 kg)  02/28/20 265 lb 12.8 oz  (120.6 kg)  06/13/19 247 lb 12.8 oz (112.4 kg)    General: Pleasant. Well developed, well nourished and in no acute distress.   HEENT: Normal.  Neck: Supple, no JVD, carotid bruits, or masses noted.  Cardiac: ***Regular rate and rhythm. No murmurs, rubs, or gallops. No edema.  Respiratory:  Lungs are clear to auscultation bilaterally with normal work of breathing.  GI: Soft and nontender.  MS: No deformity or atrophy. Gait and ROM intact.  Skin: Warm and dry. Color is normal.  Neuro:  Strength and sensation are intact and no gross focal deficits noted.  Psych: Alert, appropriate and with normal affect.   LABORATORY DATA:  EKG:  EKG {ACTION; IS/IS [id number]:[id number]} ordered today.  Personally reviewed by me. This demonstrates ***.  Lab Results  Component Value Date   WBC 14.9 (H) 06/13/2019   HGB 19.3 (H) 06/13/2019   HCT 53.7 (H) 06/13/2019   PLT 321 06/13/2019   GLUCOSE 230 (H) 06/13/2019   CHOL 129 08/01/2019   TRIG 117 08/01/2019   HDL 36 (L) 08/01/2019   LDLCALC 72 08/01/2019   ALT 22 08/01/2019   AST 20 08/01/2019   NA 137 06/13/2019   K 5.1 06/13/2019   CL 99 06/13/2019   CREATININE 0.70 06/13/2019   BUN 12 06/13/2019   CO2 21 06/13/2019   TSH 0.504 08/15/2016   INR 1.07 04/19/2017   HGBA1C 8.4 (H) 06/13/2019     BNP (last 3 results) No results for input(s): BNP in the last 8760 hours.  ProBNP (last 3 results) No results for input(s): PROBNP in the last 8760 hours.   Other Studies Reviewed Today:  Myoview Study Highlights 03/2020    The left ventricular ejection fraction is normal (55-65%).  Nuclear stress EF: 61%.  There was no ST segment deviation noted during stress.  Defect 1: There is a medium defect of severe severity present in the basal inferior, basal inferolateral, mid inferior and apical inferior location.   There is a medium size, irreversible defect in the entire inferior wall consistent with prior infarct but no ischemia.    Echo  10/05/17  Study Conclusions   - Left ventricle: The cavity size was normal. Wall thickness was    normal. Systolic function was normal. The estimated ejection    fraction  was in the range of 55% to 60%. Basal inferior    hypokinesis.  - Aortic valve: There was no stenosis.  - Right ventricle: The cavity size was normal. Systolic function    was normal.  - Pericardium, extracardiac: There was no pericardial effusion.      Cath for CTO 09/2017  Mid RCA to Dist RCA lesion is 100% stenosed.  Mid RCA lesion is 100% stenosed. Balloon angioplasty was performed.  Post intervention, there is a 100% residual stenosis.  Balloon angioplasty was performed.   Unable to open CTO.  Continue medical therapy.       ASSESSMENT AND PLAN:   1.  USA/CAD with hx of MI in 2018 now with some Lt side pain responsive to NTG.  Will check nuc study, follow up in 2-3 weeks.    2.  Rt neck and arm pain related to nerve, muscualr skeletal pain. She will follow up with PCP or ortho MD   3. HLD on statin and has seen lipid clinic    4.  DM type 2 stable.  Had been losing weight    5.  Behcet's syndrome.  Stable.    6.  HTN controlled con't meds   Current medicines are reviewed with the patient today.  The patient does not have concerns regarding medicines other than what has been noted above.  The following changes have been made:  See above.  Labs/ tests ordered today include:   No orders of the defined types were placed in this encounter.    Disposition:   FU with *** in {gen number [id number]} {Days to years:10300}.   Patient is agreeable to this plan and will call if any problems develop in the interim.   SignedNorma Fredrickson, NP  04/20/2020 9:33 AM  [facility] [address] [place], [place]  [address] Phone: [phone] Fax: [phone]

## 2020-04-16 ENCOUNTER — Other Ambulatory Visit: Payer: Self-pay | Admitting: Neurological Surgery

## 2020-04-16 DIAGNOSIS — M5412 Radiculopathy, cervical region: Secondary | ICD-10-CM | POA: Diagnosis not present

## 2020-04-16 DIAGNOSIS — Z6838 Body mass index (BMI) 38.0-38.9, adult: Secondary | ICD-10-CM | POA: Diagnosis not present

## 2020-04-16 DIAGNOSIS — I10 Essential (primary) hypertension: Secondary | ICD-10-CM | POA: Diagnosis not present

## 2020-04-21 ENCOUNTER — Ambulatory Visit: Payer: Medicare Other | Admitting: Nurse Practitioner

## 2020-04-22 DIAGNOSIS — G894 Chronic pain syndrome: Secondary | ICD-10-CM | POA: Diagnosis not present

## 2020-04-22 DIAGNOSIS — M5030 Other cervical disc degeneration, unspecified cervical region: Secondary | ICD-10-CM | POA: Diagnosis not present

## 2020-04-22 DIAGNOSIS — M5136 Other intervertebral disc degeneration, lumbar region: Secondary | ICD-10-CM | POA: Diagnosis not present

## 2020-04-22 DIAGNOSIS — M1990 Unspecified osteoarthritis, unspecified site: Secondary | ICD-10-CM | POA: Diagnosis not present

## 2020-04-29 DIAGNOSIS — G894 Chronic pain syndrome: Secondary | ICD-10-CM | POA: Diagnosis not present

## 2020-05-12 ENCOUNTER — Other Ambulatory Visit: Payer: Medicare Other

## 2020-05-18 DIAGNOSIS — G894 Chronic pain syndrome: Secondary | ICD-10-CM | POA: Diagnosis not present

## 2020-05-29 DIAGNOSIS — G894 Chronic pain syndrome: Secondary | ICD-10-CM | POA: Diagnosis not present

## 2020-06-16 DIAGNOSIS — G894 Chronic pain syndrome: Secondary | ICD-10-CM | POA: Diagnosis not present

## 2020-06-28 DIAGNOSIS — G894 Chronic pain syndrome: Secondary | ICD-10-CM | POA: Diagnosis not present

## 2020-07-14 DIAGNOSIS — G894 Chronic pain syndrome: Secondary | ICD-10-CM | POA: Diagnosis not present

## 2020-07-28 DIAGNOSIS — G894 Chronic pain syndrome: Secondary | ICD-10-CM | POA: Diagnosis not present

## 2020-08-11 DIAGNOSIS — G894 Chronic pain syndrome: Secondary | ICD-10-CM | POA: Diagnosis not present

## 2020-08-12 ENCOUNTER — Other Ambulatory Visit: Payer: Self-pay | Admitting: Interventional Cardiology

## 2020-09-08 DIAGNOSIS — G894 Chronic pain syndrome: Secondary | ICD-10-CM | POA: Diagnosis not present

## 2020-09-26 DIAGNOSIS — G894 Chronic pain syndrome: Secondary | ICD-10-CM | POA: Diagnosis not present

## 2020-10-14 DIAGNOSIS — M5136 Other intervertebral disc degeneration, lumbar region: Secondary | ICD-10-CM | POA: Diagnosis not present

## 2020-10-14 DIAGNOSIS — M1990 Unspecified osteoarthritis, unspecified site: Secondary | ICD-10-CM | POA: Diagnosis not present

## 2020-10-14 DIAGNOSIS — G894 Chronic pain syndrome: Secondary | ICD-10-CM | POA: Diagnosis not present

## 2020-10-14 DIAGNOSIS — M5030 Other cervical disc degeneration, unspecified cervical region: Secondary | ICD-10-CM | POA: Diagnosis not present

## 2020-10-26 DIAGNOSIS — G894 Chronic pain syndrome: Secondary | ICD-10-CM | POA: Diagnosis not present

## 2020-10-26 DIAGNOSIS — M5136 Other intervertebral disc degeneration, lumbar region: Secondary | ICD-10-CM | POA: Diagnosis not present

## 2020-11-11 DIAGNOSIS — Z79891 Long term (current) use of opiate analgesic: Secondary | ICD-10-CM | POA: Diagnosis not present

## 2020-11-11 DIAGNOSIS — M5136 Other intervertebral disc degeneration, lumbar region: Secondary | ICD-10-CM | POA: Diagnosis not present

## 2020-11-11 DIAGNOSIS — G894 Chronic pain syndrome: Secondary | ICD-10-CM | POA: Diagnosis not present

## 2020-11-11 DIAGNOSIS — M1990 Unspecified osteoarthritis, unspecified site: Secondary | ICD-10-CM | POA: Diagnosis not present

## 2020-11-25 DIAGNOSIS — G894 Chronic pain syndrome: Secondary | ICD-10-CM | POA: Diagnosis not present

## 2020-12-08 DIAGNOSIS — M5136 Other intervertebral disc degeneration, lumbar region: Secondary | ICD-10-CM | POA: Diagnosis not present

## 2020-12-08 DIAGNOSIS — G894 Chronic pain syndrome: Secondary | ICD-10-CM | POA: Diagnosis not present

## 2020-12-08 DIAGNOSIS — M5030 Other cervical disc degeneration, unspecified cervical region: Secondary | ICD-10-CM | POA: Diagnosis not present

## 2020-12-08 DIAGNOSIS — M1990 Unspecified osteoarthritis, unspecified site: Secondary | ICD-10-CM | POA: Diagnosis not present

## 2020-12-25 DIAGNOSIS — G894 Chronic pain syndrome: Secondary | ICD-10-CM | POA: Diagnosis not present

## 2021-01-12 DIAGNOSIS — Z79891 Long term (current) use of opiate analgesic: Secondary | ICD-10-CM | POA: Diagnosis not present

## 2021-01-12 DIAGNOSIS — G894 Chronic pain syndrome: Secondary | ICD-10-CM | POA: Diagnosis not present

## 2021-01-12 DIAGNOSIS — M1990 Unspecified osteoarthritis, unspecified site: Secondary | ICD-10-CM | POA: Diagnosis not present

## 2021-01-12 DIAGNOSIS — M5030 Other cervical disc degeneration, unspecified cervical region: Secondary | ICD-10-CM | POA: Diagnosis not present

## 2021-01-24 ENCOUNTER — Other Ambulatory Visit: Payer: Self-pay | Admitting: Cardiology

## 2021-01-24 DIAGNOSIS — G894 Chronic pain syndrome: Secondary | ICD-10-CM | POA: Diagnosis not present

## 2021-02-06 DIAGNOSIS — H6690 Otitis media, unspecified, unspecified ear: Secondary | ICD-10-CM | POA: Diagnosis not present

## 2021-02-06 DIAGNOSIS — R519 Headache, unspecified: Secondary | ICD-10-CM | POA: Diagnosis not present

## 2021-02-10 DIAGNOSIS — Z6834 Body mass index (BMI) 34.0-34.9, adult: Secondary | ICD-10-CM | POA: Diagnosis not present

## 2021-02-10 DIAGNOSIS — H60503 Unspecified acute noninfective otitis externa, bilateral: Secondary | ICD-10-CM | POA: Diagnosis not present

## 2021-02-10 DIAGNOSIS — H66012 Acute suppurative otitis media with spontaneous rupture of ear drum, left ear: Secondary | ICD-10-CM | POA: Diagnosis not present

## 2021-02-10 DIAGNOSIS — H6980 Other specified disorders of Eustachian tube, unspecified ear: Secondary | ICD-10-CM | POA: Diagnosis not present

## 2021-02-15 DIAGNOSIS — M1990 Unspecified osteoarthritis, unspecified site: Secondary | ICD-10-CM | POA: Diagnosis not present

## 2021-02-15 DIAGNOSIS — Z79891 Long term (current) use of opiate analgesic: Secondary | ICD-10-CM | POA: Diagnosis not present

## 2021-02-15 DIAGNOSIS — M5030 Other cervical disc degeneration, unspecified cervical region: Secondary | ICD-10-CM | POA: Diagnosis not present

## 2021-02-15 DIAGNOSIS — G894 Chronic pain syndrome: Secondary | ICD-10-CM | POA: Diagnosis not present

## 2021-02-23 DIAGNOSIS — G894 Chronic pain syndrome: Secondary | ICD-10-CM | POA: Diagnosis not present

## 2021-03-09 ENCOUNTER — Other Ambulatory Visit: Payer: Self-pay | Admitting: Cardiology

## 2021-03-16 DIAGNOSIS — G894 Chronic pain syndrome: Secondary | ICD-10-CM | POA: Diagnosis not present

## 2021-04-08 ENCOUNTER — Other Ambulatory Visit: Payer: Self-pay | Admitting: Interventional Cardiology

## 2021-04-12 ENCOUNTER — Other Ambulatory Visit: Payer: Self-pay | Admitting: Neurological Surgery

## 2021-04-12 DIAGNOSIS — M5412 Radiculopathy, cervical region: Secondary | ICD-10-CM

## 2021-04-20 DIAGNOSIS — G894 Chronic pain syndrome: Secondary | ICD-10-CM | POA: Diagnosis not present

## 2021-04-22 DIAGNOSIS — Z6834 Body mass index (BMI) 34.0-34.9, adult: Secondary | ICD-10-CM | POA: Diagnosis not present

## 2021-04-22 DIAGNOSIS — L97929 Non-pressure chronic ulcer of unspecified part of left lower leg with unspecified severity: Secondary | ICD-10-CM | POA: Diagnosis not present

## 2021-04-22 DIAGNOSIS — Z0000 Encounter for general adult medical examination without abnormal findings: Secondary | ICD-10-CM | POA: Diagnosis not present

## 2021-04-22 DIAGNOSIS — Z1331 Encounter for screening for depression: Secondary | ICD-10-CM | POA: Diagnosis not present

## 2021-05-10 ENCOUNTER — Other Ambulatory Visit: Payer: Self-pay | Admitting: Interventional Cardiology

## 2021-05-19 DIAGNOSIS — G894 Chronic pain syndrome: Secondary | ICD-10-CM | POA: Diagnosis not present

## 2021-05-19 DIAGNOSIS — M1990 Unspecified osteoarthritis, unspecified site: Secondary | ICD-10-CM | POA: Diagnosis not present

## 2021-05-19 DIAGNOSIS — G8929 Other chronic pain: Secondary | ICD-10-CM | POA: Diagnosis not present

## 2021-06-08 ENCOUNTER — Other Ambulatory Visit: Payer: Self-pay | Admitting: Interventional Cardiology

## 2021-06-13 DIAGNOSIS — G894 Chronic pain syndrome: Secondary | ICD-10-CM | POA: Diagnosis not present

## 2021-06-16 DIAGNOSIS — M1990 Unspecified osteoarthritis, unspecified site: Secondary | ICD-10-CM | POA: Diagnosis not present

## 2021-06-16 DIAGNOSIS — M5030 Other cervical disc degeneration, unspecified cervical region: Secondary | ICD-10-CM | POA: Diagnosis not present

## 2021-06-16 DIAGNOSIS — G894 Chronic pain syndrome: Secondary | ICD-10-CM | POA: Diagnosis not present

## 2021-06-16 DIAGNOSIS — G8929 Other chronic pain: Secondary | ICD-10-CM | POA: Diagnosis not present

## 2021-07-08 ENCOUNTER — Other Ambulatory Visit: Payer: Self-pay | Admitting: Interventional Cardiology

## 2021-07-13 DIAGNOSIS — G894 Chronic pain syndrome: Secondary | ICD-10-CM | POA: Diagnosis not present

## 2021-07-19 DIAGNOSIS — G894 Chronic pain syndrome: Secondary | ICD-10-CM | POA: Diagnosis not present

## 2021-07-19 DIAGNOSIS — M5030 Other cervical disc degeneration, unspecified cervical region: Secondary | ICD-10-CM | POA: Diagnosis not present

## 2021-07-19 DIAGNOSIS — G8929 Other chronic pain: Secondary | ICD-10-CM | POA: Diagnosis not present

## 2021-07-19 DIAGNOSIS — M5136 Other intervertebral disc degeneration, lumbar region: Secondary | ICD-10-CM | POA: Diagnosis not present

## 2021-08-09 ENCOUNTER — Other Ambulatory Visit: Payer: Self-pay | Admitting: Interventional Cardiology

## 2021-08-12 DIAGNOSIS — G894 Chronic pain syndrome: Secondary | ICD-10-CM | POA: Diagnosis not present

## 2021-08-30 DIAGNOSIS — M1990 Unspecified osteoarthritis, unspecified site: Secondary | ICD-10-CM | POA: Diagnosis not present

## 2021-08-30 DIAGNOSIS — G894 Chronic pain syndrome: Secondary | ICD-10-CM | POA: Diagnosis not present

## 2021-08-30 DIAGNOSIS — G8929 Other chronic pain: Secondary | ICD-10-CM | POA: Diagnosis not present

## 2021-08-30 DIAGNOSIS — M5030 Other cervical disc degeneration, unspecified cervical region: Secondary | ICD-10-CM | POA: Diagnosis not present

## 2021-09-07 ENCOUNTER — Other Ambulatory Visit: Payer: Self-pay | Admitting: Interventional Cardiology

## 2021-09-11 DIAGNOSIS — M1990 Unspecified osteoarthritis, unspecified site: Secondary | ICD-10-CM | POA: Diagnosis not present

## 2021-09-11 DIAGNOSIS — M5136 Other intervertebral disc degeneration, lumbar region: Secondary | ICD-10-CM | POA: Diagnosis not present

## 2021-09-23 ENCOUNTER — Other Ambulatory Visit: Payer: Self-pay

## 2021-09-23 MED ORDER — CARVEDILOL 25 MG PO TABS: ORAL_TABLET | ORAL | 0 refills | Status: DC

## 2021-09-23 MED ORDER — ISOSORBIDE MONONITRATE ER 60 MG PO TB24: 120.0000 mg | ORAL_TABLET | Freq: Every day | ORAL | 0 refills | Status: DC

## 2021-10-04 DIAGNOSIS — M5136 Other intervertebral disc degeneration, lumbar region: Secondary | ICD-10-CM | POA: Diagnosis not present

## 2021-10-04 DIAGNOSIS — M5030 Other cervical disc degeneration, unspecified cervical region: Secondary | ICD-10-CM | POA: Diagnosis not present

## 2021-10-04 DIAGNOSIS — G894 Chronic pain syndrome: Secondary | ICD-10-CM | POA: Diagnosis not present

## 2021-10-11 DIAGNOSIS — M1990 Unspecified osteoarthritis, unspecified site: Secondary | ICD-10-CM | POA: Diagnosis not present

## 2021-10-11 DIAGNOSIS — M5030 Other cervical disc degeneration, unspecified cervical region: Secondary | ICD-10-CM | POA: Diagnosis not present

## 2021-10-25 ENCOUNTER — Ambulatory Visit: Payer: Medicare Other | Admitting: Cardiology

## 2021-11-02 NOTE — H&P (View-Only) (Signed)
Office Visit    Patient Name: Brittney Banks Date of Encounter: 11/04/2021  PCP:  Ronita Hipps, MD   [place]  Cardiologist:  Sinclair Grooms, MD  Advanced Practice Provider:  No care team member to display Electrophysiologist:  None   Chief Complaint    Brittney Banks is a 51 y.o. female with a hx of hypertension, prior smoker (2012 quit), diabetes mellitus type 2, hyperlipidemia,.  She has known CAD with inferior STEMI treated with DES April 2018, reoccurring symptoms led to DES of high-grade small LAD July 2018, subsequent recurrent pain January 2019 demonstrating total occlusion of RCA with development of collaterals.  Failed CTO attempt on the right coronary July 2019.  She was felt to be not ideal for CABG due to questionable targets presents today for overdue follow-up appointment. Does heal well  She was last seen 02/2020 and at that time she been having some right neck pain.  She does have a history of cervical disc disease with fusion.  She describes the pain as shooting.  She tried nitroglycerin with relief.  She was worried about her heart at the time.  No nausea and vomiting, no shortness of breath.  A nuclear study was ordered at this time.  Stress test showed no ST segment deviation.  There was a medium sized, reversible defect in the entire inferior wall consistent with prior infarct but no ischemia.    Today, she states that she has chest pains every day.  She is on max dose Imdur and has to usually take 1-3 nitros.  Her pain can happen when she is up moving around and then also not moving.  It usually lasts about 30 seconds to a minute and then she gets to one of her nitros and it relieves the pain.  The pain is usually in the center of her chest and then shoots to her back.  She states a couple years back she had an MRI of her back and she took a few Xanax to calm herself down.  She states that Xanax helped and she did not have any chest pain.  The  pain could certainly be due to a combination of coronary disease and anxiety.  We will work-up the coronary disease but I will leave it up to her primary care to work-up her anxiety.  No edema, orthopnea, PND. Reports no palpitations.    Past Medical History    Past Medical History:  Diagnosis Date   Behcet's disease ([place])    "similiar to MS" (10/05/2017)   Cellulitis 03/2016   Chronic back pain    "all over"   Chronic bronchitis ([place])    Coronary artery disease    11/09/16 PCI with DES to mLAD, patent RCA stent   DDD (degenerative disc disease), lumbar    GERD (gastroesophageal reflux disease)    Hyperlipidemia    Hypertension    LV dysfunction    MI (myocardial infarction) ([place]) 10/2016   Migraine    "quite a bit when I was younger; now maybe 1/year" (10/05/2017)   MRSA carrier 2008   MRSA infection 2008   BOTH LEGS   Myocardial infarction involving right coronary artery ([place]) 08/15/2016   On home oxygen therapy    "3L prn" (10/05/2017)   Osteoarthritis    "in all my major bones" (10/05/2017)   Pneumonia    "several times" (10/05/2017)   Pyoderma gangrenosum    "skin autoimmune disorder" (10/05/2017)  Seasonal allergies    Type II diabetes mellitus ([place])    type 2   Past Surgical History:  Procedure Laterality Date   ANTERIOR CERVICAL DECOMP/DISCECTOMY FUSION  2008   BILATERAL CARPAL TUNNEL RELEASE Bilateral 2007   CORONARY CTO INTERVENTION  10/05/2017   CORONARY CTO INTERVENTION N/A 10/05/2017   Procedure: CORONARY CTO INTERVENTION;  Surgeon: Jettie Booze, MD;  Location: [facility];  Service: Cardiovascular;  Laterality: N/A;   CORONARY STENT INTERVENTION N/A 08/15/2016   Procedure: Coronary Stent Intervention;  Surgeon: Belva Crome, MD;  Location: [facility];  Service: Cardiovascular;  Laterality: N/A;   CORONARY STENT INTERVENTION N/A 11/09/2016   Procedure: Coronary Stent Intervention;  Surgeon: Belva Crome, MD;  Location: [facility];  Service: Cardiovascular;  Laterality: N/A;   ECTOPIC PREGNANCY SURGERY  X 3   "last time they took my tubes out"   INCISION AND DRAINAGE     "had to clean MRSA off :RUE; 2 places on left inner leg; 1 place on back side of my right leg" (10/05/2017)   JOINT REPLACEMENT     KNEE ARTHROSCOPY Right X 2   LEFT HEART CATH AND CORONARY ANGIOGRAPHY N/A 08/15/2016   Procedure: Left Heart Cath and Coronary Angiography;  Surgeon: Belva Crome, MD;  Location: [facility];  Service: Cardiovascular;  Laterality: N/A;   LEFT HEART CATH AND CORONARY ANGIOGRAPHY N/A 11/09/2016   Procedure: Left Heart Cath and Coronary Angiography;  Surgeon: Belva Crome, MD;  Location: [facility];  Service: Cardiovascular;  Laterality: N/A;   LEFT HEART CATH AND CORONARY ANGIOGRAPHY N/A 04/19/2017   Procedure: LEFT HEART CATH AND CORONARY ANGIOGRAPHY;  Surgeon: Sherren Mocha, MD;  Location: [facility];  Service: Cardiovascular;  Laterality: N/A;   TONSILLECTOMY     TOTAL KNEE ARTHROPLASTY Right    ULTRASOUND GUIDANCE FOR VASCULAR ACCESS  11/09/2016   Procedure: Ultrasound Guidance For Vascular Access;  Surgeon: Belva Crome, MD;  Location: [facility];  Service: Cardiovascular;;    Allergies  Allergies  Allergen Reactions   Anaprox [Naproxen Sodium] Shortness Of Breath and Other (See Comments)    Redness of skin and difficulty breathing    Aspirin Nausea And Vomiting and Other (See Comments)    Severe cramping, vomiting    Brilinta [Ticagrelor] Shortness Of Breath   Ceftin [Cefuroxime Axetil] Shortness Of Breath and Other (See Comments)    Skin turns red    Celexa [Citalopram Hydrobromide] Shortness Of Breath   Ciprofloxacin Shortness Of Breath and Other (See Comments)    Skin turns red    Coconut Fragrance Hives, Shortness Of Breath, Swelling and Other (See Comments)    Reaction to any kind of coconut Mouth and tongue swelling   Contrast Media [Iodinated Contrast Media]  Shortness Of Breath    Breathing difficulty   Cortisol [Hydrocortisone] Shortness Of Breath    Breathing difficulty and redness of skin   Dapsone Nausea And Vomiting    Extreme muscle weakness   Elavil [Amitriptyline] Shortness Of Breath     Redness of skin and blisters of skin Difficulty breathing   Hctz [Hydrochlorothiazide] Shortness Of Breath   Imitrex [Sumatriptan] Shortness Of Breath    Redness and difficulty breathing    Lexapro [Escitalopram Oxalate] Shortness Of Breath    Breathing difficulty Redness to skin   Methadone Nausea And Vomiting    Severely vomiting    Metrizamide Shortness Of  Breath    Breathing difficulty   Motrin [Ibuprofen] Nausea And Vomiting   Naprosyn [Naproxen] Shortness Of Breath    Breathing difficulty Redness of skin   Neurontin [Gabapentin] Shortness Of Breath    Breathing difficulty  Redness to skin Breathing difficulty  Redness to skin   Penicillins Shortness Of Breath and Rash    Has patient had a PCN reaction causing immediate rash, facial/tongue/throat swelling, SOB or lightheadedness with hypotension: Yes Has patient had a PCN reaction causing severe rash involving mucus membranes or skin necrosis: Yes Has patient had a PCN reaction that required hospitalization: Yes Has patient had a PCN reaction occurring within the last 10 years: No If all of the above answers are "NO", then may proceed with Cephalosporin use.    Sulfa Antibiotics Shortness Of Breath    Severe breathing difficulty And redness of skin   Sulfasalazine Shortness Of Breath    Severe breathing difficulty And redness of skin   Tagamet [Cimetidine] Nausea And Vomiting   Toradol [Ketorolac Tromethamine] Shortness Of Breath    Redness to skin   Vancomycin Rash    Patient Confirms Red mans syndome type rxn - Tolerated a slow infusion in past   Zoloft [Sertraline Hcl] Shortness Of Breath and Other (See Comments)    Breathing difficulty Redness to skin   Colcrys  [Colchicine] Nausea And Vomiting   Iodine Swelling and Other (See Comments)    Redness to skin   Lipitor [Atorvastatin] Diarrhea   Ranexa [Ranolazine] Other (See Comments)    "Made symptoms (heart racing and pain) worse"     EKGs/Labs/Other Studies Reviewed:   The following studies were reviewed today: Cardiac catheterization 10/05/2017  Right Coronary Artery  The RCA is a large, dominant vessel. The vessel is totally occluded in the midportion after an RV marginal branch and proximal to the stent in the distal vessel. The PDA and PLA branches fill from left-sided collaterals. The vessel appears to reconstitute in its most distal portion just before the bifurcation of the PDA and PLA branches.  Mid RCA lesion is 100% stenosed.  Mid RCA to Dist RCA lesion is 100% stenosed. The lesion was previously treated.    Right Posterior Descending Artery    Intervention   Mid RCA lesion  Angioplasty  Balloon angioplasty was performed.  Post-Intervention Lesion Assessment  The intervention was unsuccessful due to inability to cross the lesion. Pre-interventional TIMI flow is 0. Post-intervention TIMI flow is 0.  There is a 100% residual stenosis post intervention.     Coronary Diagrams  Diagnostic Dominance: Right  Intervention   Implants    No implant documentation for this case.    EKG:  EKG is  ordered today.  The ekg ordered today demonstrates NSR rate 84bpm.   Recent Labs: No results found for requested labs within last 365 days.  Recent Lipid Panel    Component Value Date/Time   CHOL 129 08/01/2019 1331   TRIG 117 08/01/2019 1331   HDL 36 (L) 08/01/2019 1331   CHOLHDL 3.6 08/01/2019 1331   CHOLHDL 6.9 08/16/2016 0342   VLDL 24 08/16/2016 0342   LDLCALC 72 08/01/2019 1331    Home Medications   Current Meds  Medication Sig   cetirizine (ZYRTEC) 10 MG tablet Take 10 mg by mouth daily.   clobetasol ointment (TEMOVATE) 2.95 % Apply 1 application topically daily.    Dexlansoprazole (DEXILANT) 30 MG capsule DR Take 1 capsule (30 mg total) by mouth daily.   diphenhydrAMINE (  BENADRYL) 50 MG capsule Take one capsule 1 hour prior to cath   glucose monitoring kit (FREESTYLE) monitoring kit 1 each by Does not apply route as needed for other. Dispense any model that is covered- dispense testing supplies for Q AC/ HS accuchecks- 1 month supply with one refil.   insulin detemir (LEVEMIR) 100 UNIT/ML injection Inject 0.3 mLs (30 Units total) into the skin at bedtime.   metFORMIN (GLUCOPHAGE) 850 MG tablet Take 850 mg by mouth 3 (three) times daily with meals.   morphine (MS CONTIN) 60 MG 12 hr tablet Take 60 mg by mouth every 8 (eight) hours.   neomycin-bacitracin-polymyxin (NEOSPORIN) ointment Apply 1 application topically as needed for wound care.   Oxycodone HCl 20 MG TABS Take 20 mg by mouth every 4 (four) hours.   oxymetazoline (AFRIN) 0.05 % nasal spray Place 1 spray into both nostrils daily as needed for congestion.   predniSONE (DELTASONE) 50 MG tablet Take one tablet 13 hours, 7 hours, and 1 hour prior to cath   [DISCONTINUED] carvedilol (COREG) 25 MG tablet take 1 tablet by mouth twice a day. Please make overdue appt with Dr. Tamala Julian before anymore refills. Thank you 3rd and Final Attempt.   [DISCONTINUED] clopidogrel (PLAVIX) 75 MG tablet Take 1 tablet (75 mg total) by mouth daily. . Please make overdue appt with Dr. Tamala Julian before anymore refills. Thank you 3rd and Final Attempt.   [DISCONTINUED] isosorbide mononitrate (IMDUR) 60 MG 24 hr tablet Take 2 tablets (120 mg total) by mouth daily. Please make overdue appt with Dr. Tamala Julian before anymore refills. Thank you 3rd and Final attempt   [DISCONTINUED] lisinopril (ZESTRIL) 20 MG tablet Take 1 tablet (20 mg total) by mouth daily. Please make overdue appt with Dr. Tamala Julian before anymore refills. Thank you 3rd and Final attempt   [DISCONTINUED] nitroGLYCERIN (NITROSTAT) 0.4 MG SL tablet Place 1 tablet (0.4 mg total) under  the tongue every 5 (five) minutes x 3 doses as needed for chest pain.   [DISCONTINUED] rosuvastatin (CRESTOR) 40 MG tablet Take 1 tablet (40 mg total) by mouth daily. Please make overdue appt with Dr. Tamala Julian before anymore refills. Thank you 3rd and Final attempt     Review of Systems      All other systems reviewed and are otherwise negative except as noted above.  Physical Exam    VS:  BP 110/70   Pulse 64   Ht 5' 8" (1.727 m)   Wt 235 lb (106.6 kg)   LMP  (LMP Unknown)   SpO2 97%   BMI 35.73 kg/m  , BMI Body mass index is 35.73 kg/m.  Wt Readings from Last 3 Encounters:  11/04/21 235 lb (106.6 kg)  03/18/20 265 lb (120.2 kg)  02/28/20 265 lb 12.8 oz (120.6 kg)     GEN: Well nourished, well developed, in no acute distress. HEENT: normal. Neck: Supple, no JVD, carotid bruits, or masses. Cardiac: RRR, no murmurs, rubs, or gallops. No clubbing, cyanosis, edema.  Radials/PT 2+ and equal bilaterally.  Respiratory:  Respirations regular and unlabored, clear to auscultation bilaterally. GI: Soft, nontender, nondistended. MS: No deformity or atrophy. Skin: Warm and dry, no rash. Neuro:  Strength and sensation are intact. Psych: Normal affect.  Assessment & Plan    CAD with history of MI 2018 -Lexiscan Myoview 12/21 with no ischemia -Patient with daily chest pain on Imdur and taking multiple nitroglycerin -Discussed another Lexiscan Myoview which she does not want to do.  I think she would  need a cardiac catheterization anyways given her history -Discussed with Dr. Tamala Julian and planned cardiac catheterization  Hyperlipidemia -Needs an updated lipid panel -we will order this at the time of her cardiac cath labs  Hypertension -well controlled today -continue current medication regimen  Type 2 diabetes mellitus -recent A1C 8.4 -Needs better control   Disposition: Follow up after cardiac cath with Sinclair Grooms, MD or APP.  Signed, Elgie Collard, PA-C 11/04/2021,  5:20 PM  [facility]

## 2021-11-02 NOTE — Progress Notes (Signed)
Office Visit    Patient Name: Brittney Banks Date of Encounter: 11/04/2021  PCP:  Ronita Hipps, MD   [place]  Cardiologist:  Sinclair Grooms, MD  Advanced Practice Provider:  No care team member to display Electrophysiologist:  None   Chief Complaint    Brittney Banks is a 51 y.o. female with a hx of hypertension, prior smoker (2012 quit), diabetes mellitus type 2, hyperlipidemia,.  She has known CAD with inferior STEMI treated with DES April 2018, reoccurring symptoms led to DES of high-grade small LAD July 2018, subsequent recurrent pain January 2019 demonstrating total occlusion of RCA with development of collaterals.  Failed CTO attempt on the right coronary July 2019.  She was felt to be not ideal for CABG due to questionable targets presents today for overdue follow-up appointment. Does heal well  She was last seen 02/2020 and at that time she been having some right neck pain.  She does have a history of cervical disc disease with fusion.  She describes the pain as shooting.  She tried nitroglycerin with relief.  She was worried about her heart at the time.  No nausea and vomiting, no shortness of breath.  A nuclear study was ordered at this time.  Stress test showed no ST segment deviation.  There was a medium sized, reversible defect in the entire inferior wall consistent with prior infarct but no ischemia.    Today, she states that she has chest pains every day.  She is on max dose Imdur and has to usually take 1-3 nitros.  Her pain can happen when she is up moving around and then also not moving.  It usually lasts about 30 seconds to a minute and then she gets to one of her nitros and it relieves the pain.  The pain is usually in the center of her chest and then shoots to her back.  She states a couple years back she had an MRI of her back and she took a few Xanax to calm herself down.  She states that Xanax helped and she did not have any chest pain.  The  pain could certainly be due to a combination of coronary disease and anxiety.  We will work-up the coronary disease but I will leave it up to her primary care to work-up her anxiety.  No edema, orthopnea, PND. Reports no palpitations.    Past Medical History    Past Medical History:  Diagnosis Date   Behcet's disease ([place])    "similiar to MS" (10/05/2017)   Cellulitis 03/2016   Chronic back pain    "all over"   Chronic bronchitis ([place])    Coronary artery disease    11/09/16 PCI with DES to mLAD, patent RCA stent   DDD (degenerative disc disease), lumbar    GERD (gastroesophageal reflux disease)    Hyperlipidemia    Hypertension    LV dysfunction    MI (myocardial infarction) ([place]) 10/2016   Migraine    "quite a bit when I was younger; now maybe 1/year" (10/05/2017)   MRSA carrier 2008   MRSA infection 2008   BOTH LEGS   Myocardial infarction involving right coronary artery ([place]) 08/15/2016   On home oxygen therapy    "3L prn" (10/05/2017)   Osteoarthritis    "in all my major bones" (10/05/2017)   Pneumonia    "several times" (10/05/2017)   Pyoderma gangrenosum    "skin autoimmune disorder" (10/05/2017)  Seasonal allergies    Type II diabetes mellitus ([place])    type 2   Past Surgical History:  Procedure Laterality Date   ANTERIOR CERVICAL DECOMP/DISCECTOMY FUSION  2008   BILATERAL CARPAL TUNNEL RELEASE Bilateral 2007   CORONARY CTO INTERVENTION  10/05/2017   CORONARY CTO INTERVENTION N/A 10/05/2017   Procedure: CORONARY CTO INTERVENTION;  Surgeon: Jettie Booze, MD;  Location: [facility];  Service: Cardiovascular;  Laterality: N/A;   CORONARY STENT INTERVENTION N/A 08/15/2016   Procedure: Coronary Stent Intervention;  Surgeon: Belva Crome, MD;  Location: [facility];  Service: Cardiovascular;  Laterality: N/A;   CORONARY STENT INTERVENTION N/A 11/09/2016   Procedure: Coronary Stent Intervention;  Surgeon: Belva Crome, MD;  Location: [facility];  Service: Cardiovascular;  Laterality: N/A;   ECTOPIC PREGNANCY SURGERY  X 3   "last time they took my tubes out"   INCISION AND DRAINAGE     "had to clean MRSA off :RUE; 2 places on left inner leg; 1 place on back side of my right leg" (10/05/2017)   JOINT REPLACEMENT     KNEE ARTHROSCOPY Right X 2   LEFT HEART CATH AND CORONARY ANGIOGRAPHY N/A 08/15/2016   Procedure: Left Heart Cath and Coronary Angiography;  Surgeon: Belva Crome, MD;  Location: [facility];  Service: Cardiovascular;  Laterality: N/A;   LEFT HEART CATH AND CORONARY ANGIOGRAPHY N/A 11/09/2016   Procedure: Left Heart Cath and Coronary Angiography;  Surgeon: Belva Crome, MD;  Location: [facility];  Service: Cardiovascular;  Laterality: N/A;   LEFT HEART CATH AND CORONARY ANGIOGRAPHY N/A 04/19/2017   Procedure: LEFT HEART CATH AND CORONARY ANGIOGRAPHY;  Surgeon: Sherren Mocha, MD;  Location: [facility];  Service: Cardiovascular;  Laterality: N/A;   TONSILLECTOMY     TOTAL KNEE ARTHROPLASTY Right    ULTRASOUND GUIDANCE FOR VASCULAR ACCESS  11/09/2016   Procedure: Ultrasound Guidance For Vascular Access;  Surgeon: Belva Crome, MD;  Location: [facility];  Service: Cardiovascular;;    Allergies  Allergies  Allergen Reactions   Anaprox [Naproxen Sodium] Shortness Of Breath and Other (See Comments)    Redness of skin and difficulty breathing    Aspirin Nausea And Vomiting and Other (See Comments)    Severe cramping, vomiting    Brilinta [Ticagrelor] Shortness Of Breath   Ceftin [Cefuroxime Axetil] Shortness Of Breath and Other (See Comments)    Skin turns red    Celexa [Citalopram Hydrobromide] Shortness Of Breath   Ciprofloxacin Shortness Of Breath and Other (See Comments)    Skin turns red    Coconut Fragrance Hives, Shortness Of Breath, Swelling and Other (See Comments)    Reaction to any kind of coconut Mouth and tongue swelling   Contrast Media [Iodinated Contrast Media]  Shortness Of Breath    Breathing difficulty   Cortisol [Hydrocortisone] Shortness Of Breath    Breathing difficulty and redness of skin   Dapsone Nausea And Vomiting    Extreme muscle weakness   Elavil [Amitriptyline] Shortness Of Breath     Redness of skin and blisters of skin Difficulty breathing   Hctz [Hydrochlorothiazide] Shortness Of Breath   Imitrex [Sumatriptan] Shortness Of Breath    Redness and difficulty breathing    Lexapro [Escitalopram Oxalate] Shortness Of Breath    Breathing difficulty Redness to skin   Methadone Nausea And Vomiting    Severely vomiting    Metrizamide Shortness Of  Breath    Breathing difficulty   Motrin [Ibuprofen] Nausea And Vomiting   Naprosyn [Naproxen] Shortness Of Breath    Breathing difficulty Redness of skin   Neurontin [Gabapentin] Shortness Of Breath    Breathing difficulty  Redness to skin Breathing difficulty  Redness to skin   Penicillins Shortness Of Breath and Rash    Has patient had a PCN reaction causing immediate rash, facial/tongue/throat swelling, SOB or lightheadedness with hypotension: Yes Has patient had a PCN reaction causing severe rash involving mucus membranes or skin necrosis: Yes Has patient had a PCN reaction that required hospitalization: Yes Has patient had a PCN reaction occurring within the last 10 years: No If all of the above answers are "NO", then may proceed with Cephalosporin use.    Sulfa Antibiotics Shortness Of Breath    Severe breathing difficulty And redness of skin   Sulfasalazine Shortness Of Breath    Severe breathing difficulty And redness of skin   Tagamet [Cimetidine] Nausea And Vomiting   Toradol [Ketorolac Tromethamine] Shortness Of Breath    Redness to skin   Vancomycin Rash    Patient Confirms Red mans syndome type rxn - Tolerated a slow infusion in past   Zoloft [Sertraline Hcl] Shortness Of Breath and Other (See Comments)    Breathing difficulty Redness to skin   Colcrys  [Colchicine] Nausea And Vomiting   Iodine Swelling and Other (See Comments)    Redness to skin   Lipitor [Atorvastatin] Diarrhea   Ranexa [Ranolazine] Other (See Comments)    "Made symptoms (heart racing and pain) worse"     EKGs/Labs/Other Studies Reviewed:   The following studies were reviewed today: Cardiac catheterization 10/05/2017  Right Coronary Artery  The RCA is a large, dominant vessel. The vessel is totally occluded in the midportion after an RV marginal branch and proximal to the stent in the distal vessel. The PDA and PLA branches fill from left-sided collaterals. The vessel appears to reconstitute in its most distal portion just before the bifurcation of the PDA and PLA branches.  Mid RCA lesion is 100% stenosed.  Mid RCA to Dist RCA lesion is 100% stenosed. The lesion was previously treated.    Right Posterior Descending Artery    Intervention   Mid RCA lesion  Angioplasty  Balloon angioplasty was performed.  Post-Intervention Lesion Assessment  The intervention was unsuccessful due to inability to cross the lesion. Pre-interventional TIMI flow is 0. Post-intervention TIMI flow is 0.  There is a 100% residual stenosis post intervention.     Coronary Diagrams  Diagnostic Dominance: Right  Intervention   Implants    No implant documentation for this case.    EKG:  EKG is  ordered today.  The ekg ordered today demonstrates NSR rate 84bpm.   Recent Labs: No results found for requested labs within last 365 days.  Recent Lipid Panel    Component Value Date/Time   CHOL 129 08/01/2019 1331   TRIG 117 08/01/2019 1331   HDL 36 (L) 08/01/2019 1331   CHOLHDL 3.6 08/01/2019 1331   CHOLHDL 6.9 08/16/2016 0342   VLDL 24 08/16/2016 0342   LDLCALC 72 08/01/2019 1331    Home Medications   Current Meds  Medication Sig   cetirizine (ZYRTEC) 10 MG tablet Take 10 mg by mouth daily.   clobetasol ointment (TEMOVATE) 2.95 % Apply 1 application topically daily.    Dexlansoprazole (DEXILANT) 30 MG capsule DR Take 1 capsule (30 mg total) by mouth daily.   diphenhydrAMINE (  BENADRYL) 50 MG capsule Take one capsule 1 hour prior to cath   glucose monitoring kit (FREESTYLE) monitoring kit 1 each by Does not apply route as needed for other. Dispense any model that is covered- dispense testing supplies for Q AC/ HS accuchecks- 1 month supply with one refil.   insulin detemir (LEVEMIR) 100 UNIT/ML injection Inject 0.3 mLs (30 Units total) into the skin at bedtime.   metFORMIN (GLUCOPHAGE) 850 MG tablet Take 850 mg by mouth 3 (three) times daily with meals.   morphine (MS CONTIN) 60 MG 12 hr tablet Take 60 mg by mouth every 8 (eight) hours.   neomycin-bacitracin-polymyxin (NEOSPORIN) ointment Apply 1 application topically as needed for wound care.   Oxycodone HCl 20 MG TABS Take 20 mg by mouth every 4 (four) hours.   oxymetazoline (AFRIN) 0.05 % nasal spray Place 1 spray into both nostrils daily as needed for congestion.   predniSONE (DELTASONE) 50 MG tablet Take one tablet 13 hours, 7 hours, and 1 hour prior to cath   [DISCONTINUED] carvedilol (COREG) 25 MG tablet take 1 tablet by mouth twice a day. Please make overdue appt with Dr. Smith before anymore refills. Thank you 3rd and Final Attempt.   [DISCONTINUED] clopidogrel (PLAVIX) 75 MG tablet Take 1 tablet (75 mg total) by mouth daily. . Please make overdue appt with Dr. Smith before anymore refills. Thank you 3rd and Final Attempt.   [DISCONTINUED] isosorbide mononitrate (IMDUR) 60 MG 24 hr tablet Take 2 tablets (120 mg total) by mouth daily. Please make overdue appt with Dr. Smith before anymore refills. Thank you 3rd and Final attempt   [DISCONTINUED] lisinopril (ZESTRIL) 20 MG tablet Take 1 tablet (20 mg total) by mouth daily. Please make overdue appt with Dr. Smith before anymore refills. Thank you 3rd and Final attempt   [DISCONTINUED] nitroGLYCERIN (NITROSTAT) 0.4 MG SL tablet Place 1 tablet (0.4 mg total) under  the tongue every 5 (five) minutes x 3 doses as needed for chest pain.   [DISCONTINUED] rosuvastatin (CRESTOR) 40 MG tablet Take 1 tablet (40 mg total) by mouth daily. Please make overdue appt with Dr. Smith before anymore refills. Thank you 3rd and Final attempt     Review of Systems      All other systems reviewed and are otherwise negative except as noted above.  Physical Exam    VS:  BP 110/70   Pulse 64   Ht 5' 8" (1.727 m)   Wt 235 lb (106.6 kg)   LMP  (LMP Unknown)   SpO2 97%   BMI 35.73 kg/m  , BMI Body mass index is 35.73 kg/m.  Wt Readings from Last 3 Encounters:  11/04/21 235 lb (106.6 kg)  03/18/20 265 lb (120.2 kg)  02/28/20 265 lb 12.8 oz (120.6 kg)     GEN: Well nourished, well developed, in no acute distress. HEENT: normal. Neck: Supple, no JVD, carotid bruits, or masses. Cardiac: RRR, no murmurs, rubs, or gallops. No clubbing, cyanosis, edema.  Radials/PT 2+ and equal bilaterally.  Respiratory:  Respirations regular and unlabored, clear to auscultation bilaterally. GI: Soft, nontender, nondistended. MS: No deformity or atrophy. Skin: Warm and dry, no rash. Neuro:  Strength and sensation are intact. Psych: Normal affect.  Assessment & Plan    CAD with history of MI 2018 -Lexiscan Myoview 12/21 with no ischemia -Patient with daily chest pain on Imdur and taking multiple nitroglycerin -Discussed another Lexiscan Myoview which she does not want to do.  I think she would   need a cardiac catheterization anyways given her history -Discussed with Dr. Smith and planned cardiac catheterization  Hyperlipidemia -Needs an updated lipid panel -we will order this at the time of her cardiac cath labs  Hypertension -well controlled today -continue current medication regimen  Type 2 diabetes mellitus -recent A1C 8.4 -Needs better control   Disposition: Follow up after cardiac cath with Henry W Smith III, MD or APP.  Signed, Zeya Balles N Keonte Daubenspeck, PA-C 11/04/2021,  5:20 PM [facility]  

## 2021-11-03 DIAGNOSIS — M5030 Other cervical disc degeneration, unspecified cervical region: Secondary | ICD-10-CM | POA: Diagnosis not present

## 2021-11-03 DIAGNOSIS — M5136 Other intervertebral disc degeneration, lumbar region: Secondary | ICD-10-CM | POA: Diagnosis not present

## 2021-11-03 DIAGNOSIS — G894 Chronic pain syndrome: Secondary | ICD-10-CM | POA: Diagnosis not present

## 2021-11-03 DIAGNOSIS — M1990 Unspecified osteoarthritis, unspecified site: Secondary | ICD-10-CM | POA: Diagnosis not present

## 2021-11-04 ENCOUNTER — Ambulatory Visit (INDEPENDENT_AMBULATORY_CARE_PROVIDER_SITE_OTHER): Payer: Medicare Other | Admitting: Physician Assistant

## 2021-11-04 ENCOUNTER — Other Ambulatory Visit: Payer: Self-pay | Admitting: Interventional Cardiology

## 2021-11-04 ENCOUNTER — Other Ambulatory Visit: Payer: Self-pay | Admitting: Physician Assistant

## 2021-11-04 ENCOUNTER — Encounter: Payer: Self-pay | Admitting: Physician Assistant

## 2021-11-04 VITALS — BP 110/70 | HR 64 | Ht 68.0 in | Wt 235.0 lb

## 2021-11-04 DIAGNOSIS — E669 Obesity, unspecified: Secondary | ICD-10-CM | POA: Diagnosis not present

## 2021-11-04 DIAGNOSIS — E785 Hyperlipidemia, unspecified: Secondary | ICD-10-CM | POA: Diagnosis not present

## 2021-11-04 DIAGNOSIS — E1169 Type 2 diabetes mellitus with other specified complication: Secondary | ICD-10-CM | POA: Diagnosis not present

## 2021-11-04 DIAGNOSIS — I2510 Atherosclerotic heart disease of native coronary artery without angina pectoris: Secondary | ICD-10-CM | POA: Diagnosis not present

## 2021-11-04 DIAGNOSIS — I10 Essential (primary) hypertension: Secondary | ICD-10-CM

## 2021-11-04 DIAGNOSIS — M352 Behcet's disease: Secondary | ICD-10-CM

## 2021-11-04 DIAGNOSIS — M542 Cervicalgia: Secondary | ICD-10-CM

## 2021-11-04 MED ORDER — PREDNISONE 50 MG PO TABS: ORAL_TABLET | ORAL | 0 refills | Status: DC

## 2021-11-04 MED ORDER — DIPHENHYDRAMINE HCL 50 MG PO CAPS: ORAL_CAPSULE | ORAL | 0 refills | Status: AC

## 2021-11-04 MED ORDER — ISOSORBIDE MONONITRATE ER 60 MG PO TB24: 120.0000 mg | ORAL_TABLET | Freq: Every day | ORAL | 3 refills | Status: DC

## 2021-11-04 MED ORDER — CARVEDILOL 25 MG PO TABS: ORAL_TABLET | ORAL | 3 refills | Status: DC

## 2021-11-04 MED ORDER — NITROGLYCERIN 0.4 MG SL SUBL: 0.4000 mg | SUBLINGUAL_TABLET | SUBLINGUAL | 3 refills | Status: AC | PRN

## 2021-11-04 MED ORDER — ROSUVASTATIN CALCIUM 40 MG PO TABS: 40.0000 mg | ORAL_TABLET | Freq: Every day | ORAL | 3 refills | Status: DC

## 2021-11-04 MED ORDER — CLOPIDOGREL BISULFATE 75 MG PO TABS: 75.0000 mg | ORAL_TABLET | Freq: Every day | ORAL | 3 refills | Status: DC

## 2021-11-04 MED ORDER — LISINOPRIL 20 MG PO TABS: 20.0000 mg | ORAL_TABLET | Freq: Every day | ORAL | 3 refills | Status: DC

## 2021-11-04 NOTE — Patient Instructions (Signed)
Medication Instructions:  Your physician recommends that you continue on your current medications as directed. Please refer to the Current Medication list given to you today.  *If you need a refill on your cardiac medications before your next appointment, please call your pharmacy*   Lab Work: CBC, BMET and lipids today If you have labs (blood work) drawn today and your tests are completely normal, you will receive your results only by: MyChart Message (if you have MyChart) OR A paper copy in the mail If you have any lab test that is abnormal or we need to change your treatment, we will call you to review the results.   Testing/Procedures:  [facility] CARDIOVASCULAR DIVISION [facility] OFFICE [address] [place], [address] [place] [address] Dept: [phone] Loc: [phone]  Brittney Banks  11/04/2021  You are scheduled for a Cardiac Catheterization on Tuesday, July 25 with Dr. Verdis Prime.  1. Please arrive at the Main Entrance A at [facility]: [address] [place], [place] [address] at 8:30 AM (This time is two hours before your procedure to ensure your preparation). Free valet parking service is available.   Special note: Every effort is made to have your procedure done on time. Please understand that emergencies sometimes delay scheduled procedures.  2. Diet: Do not eat solid foods after midnight.  You may have clear liquids until 5 AM upon the day of the procedure.  3. Labs: You will need to have blood drawn today  4. Medication instructions in preparation for your procedure:   Contrast Allergy: Yes, Please take Prednisone 50mg  by mouth at: Thirteen hours prior to cath 9:30pm on Monday Seven hours prior to cath 3:30am on Tuesday And prior to leaving home please take last dose of Prednisone 50mg  and Benadryl 50mg  by mouth.   Take only 15 units of insulin the night before your procedure. Do not take any insulin on the  day of the procedure.  Do not take Diabetes Med Glucophage (Metformin) on the day of the procedure and HOLD 48 HOURS AFTER THE PROCEDURE.  On the morning of your procedure, take Aspirin 81 mg and any morning medicines NOT listed above.  You may use sips of water.  5. Plan to go home the same day, you will only stay overnight if medically necessary. 6. You MUST have a responsible adult to drive you home. 7. An adult MUST be with you the first 24 hours after you arrive home. 8. Bring a current list of your medications, and the last time and date medication taken. 9. Bring ID and current insurance cards. 10.Please wear clothes that are easy to get on and off and wear slip-on shoes.  Thank you for allowing Saturday to care for you!   -- [facility]    Follow-Up: At [facility], you and your health needs are our priority.  As part of our continuing mission to provide you with exceptional heart care, we have created designated Provider Care Teams.  These Care Teams include your primary Cardiologist (physician) and Advanced Practice Providers (APPs -  Physician Assistants and Nurse Practitioners) who all work together to provide you with the care you need, when you need it.  We recommend signing up for the patient portal called "MyChart".  Sign up information is provided on this After Visit Summary.  MyChart is used to connect with patients for Virtual Visits (Telemedicine).  Patients are able to view lab/test  results, encounter notes, upcoming appointments, etc.  Non-urgent messages can be sent to your provider as well.   To learn more about what you can do with MyChart, go to [email or web address].    Your next appointment:   2 weeks after cath  The format for your next appointment:   In Person  Provider:   Lesleigh Noe, MD  or Jari Favre, PA-C       Important Information About Sugar

## 2021-11-05 LAB — BASIC METABOLIC PANEL
BUN/Creatinine Ratio: 23 (ref 9–23)
BUN: 17 mg/dL (ref 6–24)
CO2: 25 mmol/L (ref 20–29)
Calcium: 9.3 mg/dL (ref 8.7–10.2)
Chloride: 101 mmol/L (ref 96–106)
Creatinine, Ser: 0.74 mg/dL (ref 0.57–1.00)
Glucose: 167 mg/dL — ABNORMAL HIGH (ref 70–99)
Potassium: 4.5 mmol/L (ref 3.5–5.2)
Sodium: 141 mmol/L (ref 134–144)
eGFR: 99 mL/min/{1.73_m2} (ref >59)

## 2021-11-05 LAB — CBC
Hematocrit: 48.4 % — ABNORMAL HIGH (ref 34.0–46.6)
Hemoglobin: 16.7 g/dL — ABNORMAL HIGH (ref 11.1–15.9)
MCH: 30.9 pg (ref 26.6–33.0)
MCHC: 34.5 g/dL (ref 31.5–35.7)
MCV: 90 fL (ref 79–97)
Platelets: 203 10*3/uL (ref 150–450)
RBC: 5.41 x10E6/uL — ABNORMAL HIGH (ref 3.77–5.28)
RDW: 11.9 % (ref 11.7–15.4)
WBC: 12.8 10*3/uL — ABNORMAL HIGH (ref 3.4–10.8)

## 2021-11-05 LAB — LIPID PANEL
Chol/HDL Ratio: 6.2 ratio — ABNORMAL HIGH (ref 0.0–4.4)
Cholesterol, Total: 192 mg/dL (ref 100–199)
HDL: 31 mg/dL — ABNORMAL LOW (ref >39)
LDL Chol Calc (NIH): 117 mg/dL — ABNORMAL HIGH (ref 0–99)
Triglycerides: 252 mg/dL — ABNORMAL HIGH (ref 0–149)
VLDL Cholesterol Cal: 44 mg/dL — ABNORMAL HIGH (ref 5–40)

## 2021-11-08 ENCOUNTER — Telehealth: Payer: Self-pay | Admitting: *Deleted

## 2021-11-08 NOTE — Telephone Encounter (Signed)
Left voicemail message for patient to call back to review procedure instructions.

## 2021-11-08 NOTE — Telephone Encounter (Signed)
Reviewed procedure instructions with patient. Patient reports she does not currently take Insulin, confirmed she is taking metformin 850 mg three times a day thirty minutes before meals.

## 2021-11-08 NOTE — Telephone Encounter (Addendum)
Cardiac Catheterization scheduled at [facility] for: Tuesday November 09, 2021 10:30 AM Arrival time and place: [facility] Main Entrance A at: 8:30 AM   Nothing to eat after midnight prior to procedure, clear liquids until 5 AM day of procedure.  CONTRAST ALLERGY: yes-13 hour Prednisone and Benadryl Prep: 11/08/21 Prednisone 50 mg 9:30 PM 11/09/21 Prednisone 50 mg 3:30 AM 11/09/21 Prednisone 50 mg and Benadryl 50 mg just prior to leaving home for hospital. Pt advised not to drive.  Medication instructions: -Hold:  Metformin-day of procedure and 48 hours post procedure -Except hold medications usual morning medications can be taken with sips of water including Plavix 75 mg, Prednisone, Benadryl.  Confirmed patient has responsible adult to drive home post procedure and be with patient first 24 hours after arriving home.  Patient reports no new symptoms concerning for COVID-19 in the past 10 days.  Left message for patient to call back to review instructions.

## 2021-11-09 ENCOUNTER — Ambulatory Visit (HOSPITAL_COMMUNITY)
Admission: RE | Admit: 2021-11-09 | Discharge: 2021-11-09 | Disposition: A | Discharge status: Home / Self Care | Payer: Medicare Other | Attending: Interventional Cardiology | Admitting: Interventional Cardiology

## 2021-11-09 ENCOUNTER — Telehealth: Payer: Self-pay | Admitting: *Deleted

## 2021-11-09 ENCOUNTER — Encounter (HOSPITAL_COMMUNITY)
Admission: RE | Disposition: A | Discharge status: Home / Self Care | Payer: Self-pay | Source: Home / Self Care | Attending: Interventional Cardiology

## 2021-11-09 DIAGNOSIS — I25118 Atherosclerotic heart disease of native coronary artery with other forms of angina pectoris: Secondary | ICD-10-CM | POA: Diagnosis present

## 2021-11-09 DIAGNOSIS — I209 Angina pectoris, unspecified: Secondary | ICD-10-CM | POA: Diagnosis present

## 2021-11-09 DIAGNOSIS — I25119 Atherosclerotic heart disease of native coronary artery with unspecified angina pectoris: Secondary | ICD-10-CM | POA: Diagnosis present

## 2021-11-09 DIAGNOSIS — M48062 Spinal stenosis, lumbar region with neurogenic claudication: Secondary | ICD-10-CM | POA: Diagnosis present

## 2021-11-09 DIAGNOSIS — E1169 Type 2 diabetes mellitus with other specified complication: Secondary | ICD-10-CM | POA: Diagnosis present

## 2021-11-09 DIAGNOSIS — E782 Mixed hyperlipidemia: Secondary | ICD-10-CM | POA: Diagnosis present

## 2021-11-09 DIAGNOSIS — I10 Essential (primary) hypertension: Secondary | ICD-10-CM | POA: Diagnosis present

## 2021-11-09 SURGERY — RIGHT/LEFT HEART CATH AND CORONARY ANGIOGRAPHY
Anesthesia: LOCAL

## 2021-11-09 MED ORDER — SODIUM CHLORIDE 0.9 % IV SOLN: 250.0000 mL | INTRAVENOUS | Status: DC | PRN

## 2021-11-09 MED ORDER — SODIUM CHLORIDE 0.9 % WEIGHT BASED INFUSION: 1.0000 mL/kg/h | INTRAVENOUS | Status: DC

## 2021-11-09 MED ORDER — SODIUM CHLORIDE 0.9% FLUSH: 3.0000 mL | Freq: Two times a day (BID) | INTRAVENOUS | Status: DC

## 2021-11-09 MED ORDER — SODIUM CHLORIDE 0.9 % WEIGHT BASED INFUSION: 3.0000 mL/kg/h | INTRAVENOUS | Status: DC

## 2021-11-09 MED ORDER — SODIUM CHLORIDE 0.9% FLUSH: 3.0000 mL | INTRAVENOUS | Status: DC | PRN

## 2021-11-09 NOTE — Telephone Encounter (Signed)
I received Secure Chat message from RN [facility] this morning that patient did not take contrast allergy pre-med and cath had been rescheduled to 11/11/21.  Cardiac Catheterization scheduled at [facility] for: Thursday November 11, 2021 10:30 AM Arrival time and place: [facility] Main Entrance A at: 8:30 AM   Nothing to eat after midnight prior to procedure, clear liquids until 5 AM day of procedure.  CONTRAST ALLERGY: yes-13 Prednisone and Benadryl Prep reviewed with patient. 11/10/21 Prednisone 50 mg 9:30 PM 11/11/21 Prednisone 50 mg 3:30 AM 11/11/21 Prednisone 50 mg and Benadryl 50 mg just prior to leaving home for hospital. Patient advised not to drive  Medication instructions: -Hold:  Metformin-day of procedure and 48 hours post procedure -Except hold medications usual morning medications can be taken with sips of water including Plavix 75 mg, Prednisone 50 mg and Benadryl 50 mg.  Confirmed patient has responsible adult to drive home post procedure and be with patient first 24 hours after arriving home.  Patient reports no new symptoms concerning for COVID-19 in the past 10 days.  Reviewed procedure instructions, contrast allergy pre-med instructions/times with patient.

## 2021-11-09 NOTE — Telephone Encounter (Signed)
Patient reports she went to her pharmacy yesterday about 6 PM to pick up Prednisone prescription for today. When she got to the pharmacy, it was closed and the gate was up so she was unable to get Prednisone prescription yesterday. Patient reports she picked up Prednisone this morning, I confirmed she received #3 Prednisone 50 mg tablets and she has Benadryl 50 mg.   Patient states she understood contrast allergy pre-med instructions for cath today-she added that she had done it several times in the past-she just was not able to get Prednisone prescription in time as noted above.   I reviewed contrast allergy instructions for cath on 11/11/21 with patient, including date/times,  patient understands instructions and does not have any questions.

## 2021-11-10 ENCOUNTER — Telehealth: Payer: Self-pay

## 2021-11-10 DIAGNOSIS — E782 Mixed hyperlipidemia: Secondary | ICD-10-CM

## 2021-11-10 DIAGNOSIS — I2510 Atherosclerotic heart disease of native coronary artery without angina pectoris: Secondary | ICD-10-CM

## 2021-11-10 DIAGNOSIS — M47819 Spondylosis without myelopathy or radiculopathy, site unspecified: Secondary | ICD-10-CM | POA: Diagnosis not present

## 2021-11-10 DIAGNOSIS — M1990 Unspecified osteoarthritis, unspecified site: Secondary | ICD-10-CM | POA: Diagnosis not present

## 2021-11-10 NOTE — Telephone Encounter (Signed)
The patient has been notified of the result and verbalized understanding.  All questions (if any) were answered. [name], RN 11/10/2021 10:33 AM  Patient would prefer to work on her diet and repeat labs in 6-8 weeks. Patient educated on heart healthy, low cholesterol diet. Labs have been scheduled.

## 2021-11-10 NOTE — Telephone Encounter (Signed)
-----   Message from Sharlene Dory, [place] sent at 11/05/2021 11:43 AM EDT ----- Ms. Rowser,   Your labs look okay today.  Your white blood cells are slightly elevated as well as your hemoglobin.  This is consistent with previous labs.  Your glucose was slightly elevated even for a nonfasting test.  Please make efforts to lower your blood glucose.  Your renal function is normal as well as your electrolytes.  Your lipid panel was elevated.  Specifically your triglycerides and your LDL were above goal.  You can make efforts to change your diet and we can repeat a lipid panel in 6 to 8 weeks.  If you feel like you cannot make any dietary adjustments then I would add Zetia 10 mg daily to your medication list.  Please let me know which she would like to do.  Thanks,   Sharlene Dory, PA-C

## 2021-11-10 NOTE — H&P (Signed)
Totally occluded dominant RCA within previously placed stent.  Failed CTO PCI. Small diffusely diseased LAD with previous mid LAD stent placement, diameter 2.25 mm was oversized. Large circumflex territory has not previously had disease. Accelerating symptoms is leading to repeat diagnostic catheterization. Previously seen by CT CS and was not felt to be a surgical candidate at the time because of limited distal targets in ischemic zones.

## 2021-11-11 ENCOUNTER — Other Ambulatory Visit: Payer: Self-pay

## 2021-11-11 ENCOUNTER — Ambulatory Visit (HOSPITAL_COMMUNITY)
Admission: RE | Disposition: A | Discharge status: Home / Self Care | Payer: Self-pay | Source: Home / Self Care | Attending: Interventional Cardiology

## 2021-11-11 ENCOUNTER — Ambulatory Visit (HOSPITAL_COMMUNITY)
Admission: RE | Admit: 2021-11-11 | Discharge: 2021-11-11 | Disposition: A | Discharge status: Home / Self Care | Payer: Medicare Other | Attending: Interventional Cardiology | Admitting: Interventional Cardiology

## 2021-11-11 DIAGNOSIS — Z955 Presence of coronary angioplasty implant and graft: Secondary | ICD-10-CM | POA: Diagnosis not present

## 2021-11-11 DIAGNOSIS — I252 Old myocardial infarction: Secondary | ICD-10-CM | POA: Diagnosis not present

## 2021-11-11 DIAGNOSIS — Z87891 Personal history of nicotine dependence: Secondary | ICD-10-CM | POA: Insufficient documentation

## 2021-11-11 DIAGNOSIS — I2582 Chronic total occlusion of coronary artery: Secondary | ICD-10-CM | POA: Diagnosis not present

## 2021-11-11 DIAGNOSIS — I25119 Atherosclerotic heart disease of native coronary artery with unspecified angina pectoris: Secondary | ICD-10-CM

## 2021-11-11 DIAGNOSIS — E785 Hyperlipidemia, unspecified: Secondary | ICD-10-CM | POA: Diagnosis not present

## 2021-11-11 DIAGNOSIS — R079 Chest pain, unspecified: Secondary | ICD-10-CM | POA: Insufficient documentation

## 2021-11-11 DIAGNOSIS — I10 Essential (primary) hypertension: Secondary | ICD-10-CM | POA: Insufficient documentation

## 2021-11-11 DIAGNOSIS — E119 Type 2 diabetes mellitus without complications: Secondary | ICD-10-CM | POA: Diagnosis not present

## 2021-11-11 DIAGNOSIS — I2510 Atherosclerotic heart disease of native coronary artery without angina pectoris: Secondary | ICD-10-CM | POA: Diagnosis not present

## 2021-11-11 DIAGNOSIS — I209 Angina pectoris, unspecified: Secondary | ICD-10-CM | POA: Diagnosis present

## 2021-11-11 DIAGNOSIS — I25118 Atherosclerotic heart disease of native coronary artery with other forms of angina pectoris: Secondary | ICD-10-CM | POA: Diagnosis present

## 2021-11-11 DIAGNOSIS — E782 Mixed hyperlipidemia: Secondary | ICD-10-CM | POA: Diagnosis present

## 2021-11-11 DIAGNOSIS — E1169 Type 2 diabetes mellitus with other specified complication: Secondary | ICD-10-CM | POA: Diagnosis present

## 2021-11-11 HISTORY — PX: LEFT HEART CATH AND CORONARY ANGIOGRAPHY: CATH118249

## 2021-11-11 LAB — GLUCOSE, CAPILLARY
Glucose-Capillary: 237 mg/dL — ABNORMAL HIGH (ref 70–99)
Glucose-Capillary: 235 mg/dL — ABNORMAL HIGH (ref 70–99)

## 2021-11-11 SURGERY — LEFT HEART CATH AND CORONARY ANGIOGRAPHY
Anesthesia: LOCAL

## 2021-11-11 MED ORDER — FENTANYL CITRATE (PF) 100 MCG/2ML IJ SOLN
INTRAMUSCULAR | Status: DC | PRN
  Administered 2021-11-11 (×2): 50 ug via INTRAVENOUS

## 2021-11-11 MED ORDER — SODIUM CHLORIDE 0.9% FLUSH: 3.0000 mL | INTRAVENOUS | Status: DC | PRN

## 2021-11-11 MED ORDER — IOHEXOL 350 MG/ML SOLN
INTRAVENOUS | Status: DC | PRN
  Administered 2021-11-11: 60 mL via INTRA_ARTERIAL

## 2021-11-11 MED ORDER — SODIUM CHLORIDE 0.9% FLUSH
3.0000 mL | Freq: Two times a day (BID) | INTRAVENOUS | Status: DC
Start: 2021-11-11 — End: 2021-11-11

## 2021-11-11 MED ORDER — LABETALOL HCL 5 MG/ML IV SOLN: 10.0000 mg | INTRAVENOUS | Status: DC | PRN

## 2021-11-11 MED ORDER — SODIUM CHLORIDE 0.9% FLUSH
3.0000 mL | INTRAVENOUS | Status: DC | PRN
Start: 2021-11-11 — End: 2021-11-11

## 2021-11-11 MED ORDER — SODIUM CHLORIDE 0.9 % IV BOLUS
150.0000 mL | Freq: Once | INTRAVENOUS | Status: DC
Start: 2021-11-11 — End: 2021-11-11

## 2021-11-11 MED ORDER — ONDANSETRON HCL 4 MG/2ML IJ SOLN: 4.0000 mg | Freq: Four times a day (QID) | INTRAMUSCULAR | Status: DC | PRN

## 2021-11-11 MED ORDER — NITROGLYCERIN 1 MG/10 ML FOR IR/CATH LAB
INTRA_ARTERIAL | Status: AC
  Filled 2021-11-11: qty 10

## 2021-11-11 MED ORDER — SODIUM CHLORIDE 0.9 % WEIGHT BASED INFUSION: 1.0000 mL/kg/h | INTRAVENOUS | Status: DC

## 2021-11-11 MED ORDER — MIDAZOLAM HCL 2 MG/2ML IJ SOLN
INTRAMUSCULAR | Status: DC | PRN
  Administered 2021-11-11: 1 mg via INTRAVENOUS

## 2021-11-11 MED ORDER — HEPARIN SODIUM (PORCINE) 1000 UNIT/ML IJ SOLN
INTRAMUSCULAR | Status: DC | PRN
  Administered 2021-11-11: 5000 [IU] via INTRAVENOUS

## 2021-11-11 MED ORDER — ACETAMINOPHEN 325 MG PO TABS
650.0000 mg | ORAL_TABLET | ORAL | Status: DC | PRN
Start: 2021-11-11 — End: 2021-11-11

## 2021-11-11 MED ORDER — SODIUM CHLORIDE 0.9 % IV SOLN: 250.0000 mL | INTRAVENOUS | Status: DC | PRN

## 2021-11-11 MED ORDER — VERAPAMIL HCL 2.5 MG/ML IV SOLN
INTRAVENOUS | Status: AC
  Filled 2021-11-11: qty 2

## 2021-11-11 MED ORDER — HEPARIN SODIUM (PORCINE) 1000 UNIT/ML IJ SOLN
INTRAMUSCULAR | Status: AC
  Filled 2021-11-11: qty 10

## 2021-11-11 MED ORDER — HYDRALAZINE HCL 20 MG/ML IJ SOLN: 10.0000 mg | INTRAMUSCULAR | Status: DC | PRN

## 2021-11-11 MED ORDER — SODIUM CHLORIDE 0.9 % IV SOLN: INTRAVENOUS | Status: DC

## 2021-11-11 MED ORDER — SODIUM CHLORIDE 0.9 % WEIGHT BASED INFUSION
3.0000 mL/kg/h | INTRAVENOUS | Status: AC
Start: 2021-11-11 — End: 2021-11-11
  Administered 2021-11-11: 3 mL/kg/h via INTRAVENOUS

## 2021-11-11 MED ORDER — VERAPAMIL HCL 2.5 MG/ML IV SOLN
INTRAVENOUS | Status: DC | PRN
  Administered 2021-11-11: 10 mL via INTRA_ARTERIAL

## 2021-11-11 MED ORDER — LIDOCAINE HCL (PF) 1 % IJ SOLN
INTRAMUSCULAR | Status: DC | PRN
  Administered 2021-11-11 (×2): 2 mL via INTRADERMAL

## 2021-11-11 MED ORDER — LIDOCAINE HCL (PF) 1 % IJ SOLN
INTRAMUSCULAR | Status: AC
  Filled 2021-11-11: qty 30

## 2021-11-11 MED ORDER — MIDAZOLAM HCL 2 MG/2ML IJ SOLN
INTRAMUSCULAR | Status: AC
  Filled 2021-11-11: qty 2

## 2021-11-11 MED ORDER — FENTANYL CITRATE (PF) 100 MCG/2ML IJ SOLN
INTRAMUSCULAR | Status: AC
  Filled 2021-11-11: qty 2

## 2021-11-11 MED ORDER — HEPARIN (PORCINE) IN NACL 1000-0.9 UT/500ML-% IV SOLN
INTRAVENOUS | Status: DC | PRN
  Administered 2021-11-11 (×2): 500 mL

## 2021-11-11 SURGICAL SUPPLY — 14 items
BAND CMPR LRG ZPHR (HEMOSTASIS) ×1
BAND ZEPHYR COMPRESS 30 LONG (HEMOSTASIS) ×1 IMPLANT
CATH INFINITI 5 FR JL3.5 (CATHETERS) ×1 IMPLANT
CATH INFINITI 5FR MULTPACK ANG (CATHETERS) ×1 IMPLANT
ELECT DEFIB PAD ADLT CADENCE (PAD) ×1 IMPLANT
GLIDESHEATH SLEND A-KIT 6F 22G (SHEATH) ×1 IMPLANT
GLIDESHEATH SLEND SS 6F .021 (SHEATH) IMPLANT
GUIDEWIRE INQWIRE 1.5J.035X260 (WIRE) IMPLANT
INQWIRE 1.5J .035X260CM (WIRE) ×2
KIT HEART LEFT (KITS) ×2 IMPLANT
PACK CARDIAC CATHETERIZATION (CUSTOM PROCEDURE TRAY) ×2 IMPLANT
SHEATH PROBE COVER 6X72 (BAG) ×1 IMPLANT
TRANSDUCER W/STOPCOCK (MISCELLANEOUS) ×2 IMPLANT
TUBING CIL FLEX 10 FLL-RA (TUBING) ×2 IMPLANT

## 2021-11-11 NOTE — Interval H&P Note (Signed)
Cath Lab Visit (complete for each Cath Lab visit)  Clinical Evaluation Leading to the Procedure:   ACS: Yes.    Non-ACS:    Anginal Classification: CCS III  Anti-ischemic medical therapy: Maximal Therapy (2 or more classes of medications)  Non-Invasive Test Results: No non-invasive testing performed  Prior CABG: No previous CABG      History and Physical Interval Note:  11/11/2021 11:00 AM  Brittney Banks  has presented today for surgery, with the diagnosis of chest pain.  The various methods of treatment have been discussed with the patient and family. After consideration of risks, benefits and other options for treatment, the patient has consented to  Procedure(s): LEFT HEART CATH AND CORONARY ANGIOGRAPHY (N/A) as a surgical intervention.  The patient's history has been reviewed, patient examined, no change in status, stable for surgery.  I have reviewed the patient's chart and labs.  Questions were answered to the patient's satisfaction.     Lyn Records III

## 2021-11-11 NOTE — Progress Notes (Signed)
Pt ambulated to [facility] 

## 2021-11-11 NOTE — Progress Notes (Signed)
Dr Katrinka Blazing notified of BP since arrival-IVF orders received

## 2021-11-12 ENCOUNTER — Encounter (HOSPITAL_COMMUNITY): Payer: Self-pay | Admitting: Interventional Cardiology

## 2021-11-12 MED FILL — Nitroglycerin IV Soln 100 MCG/ML in D5W: INTRA_ARTERIAL | Qty: 10 | Status: AC

## 2021-11-12 NOTE — H&P (Signed)
Please see the office note from [facility], and the H and P x 2 attached to the note.

## 2021-11-12 NOTE — Interval H&P Note (Signed)
Cath Lab Visit (complete for each Cath Lab visit)  Clinical Evaluation Leading to the Procedure:   ACS: Yes.    Non-ACS:    Anginal Classification: CCS III  Anti-ischemic medical therapy: Maximal Therapy (2 or more classes of medications)  Non-Invasive Test Results: No non-invasive testing performed  Prior CABG: No previous CABG      History and Physical Interval Note:  11/12/2021 8:05 AM  Brittney Banks  has presented today for surgery, with the diagnosis of chest pain.  The various methods of treatment have been discussed with the patient and family. After consideration of risks, benefits and other options for treatment, the patient has consented to  Procedure(s): LEFT HEART CATH AND CORONARY ANGIOGRAPHY (N/A) as a surgical intervention.  The patient's history has been reviewed, patient examined, no change in status, stable for surgery.  I have reviewed the patient's chart and labs.  Questions were answered to the patient's satisfaction.     Lyn Records III

## 2021-11-19 NOTE — Progress Notes (Deleted)
Office Visit    Patient Name: Brittney Banks Date of Encounter: 11/19/2021  PCP:  Marylen Ponto, MD   [facility]  Cardiologist:  Lesleigh Noe, MD  Advanced Practice Provider:  No care team member to display Electrophysiologist:  None   Chief Complaint    Brittney Banks is a 51 y.o. female with a hx of hypertension, prior smoker (2012 quit), diabetes mellitus type 2, hyperlipidemia,.  She has known CAD with inferior STEMI treated with DES April 2018, reoccurring symptoms led to DES of high-grade small LAD July 2018, subsequent recurrent pain January 2019 demonstrating total occlusion of RCA with development of collaterals.  Failed CTO attempt on the right coronary July 2019.  She was felt to be not ideal for CABG due to questionable targets presents today for overdue follow-up appointment. Does heal well  She was last seen 02/2020 and at that time she been having some right neck pain.  She does have a history of cervical disc disease with fusion.  She describes the pain as shooting.  She tried nitroglycerin with relief.  She was worried about her heart at the time.  No nausea and vomiting, no shortness of breath.  A nuclear study was ordered at this time.  Stress test showed no ST segment deviation.  There was a medium sized, reversible defect in the entire inferior wall consistent with prior infarct but no ischemia.    When she was last seen in the office on 7/20 she was having chest pains every day.  She is on max dose Imdur and has to usually take 1-3 nitros.  Her pain can happen when she is up moving around and then also not moving.  It usually lasts about 30 seconds to a minute and then she gets to one of her nitros and it relieves the pain.  The pain is usually in the center of her chest and then shoots to her back.  She states a couple years back she had an MRI of her back and she took a few Xanax to calm herself down.  She states that Xanax helped and she did not  have any chest pain.  The pain could certainly be due to a combination of coronary disease and anxiety.  We will work-up the coronary disease but I will leave it up to her primary care to work-up her anxiety.  Today, she ***  Past Medical History    Past Medical History:  Diagnosis Date   Behcet's disease (HCC)    "similiar to MS" (10/05/2017)   Cellulitis 03/2016   Chronic back pain    "all over"   Chronic bronchitis (HCC)    Coronary artery disease    11/09/16 PCI with DES to mLAD, patent RCA stent   DDD (degenerative disc disease), lumbar    GERD (gastroesophageal reflux disease)    Hyperlipidemia    Hypertension    LV dysfunction    MI (myocardial infarction) (HCC) 10/2016   Migraine    "quite a bit when I was younger; now maybe 1/year" (10/05/2017)   MRSA carrier 2008   MRSA infection 2008   BOTH LEGS   Myocardial infarction involving right coronary artery (HCC) 08/15/2016   On home oxygen therapy    "3L prn" (10/05/2017)   Osteoarthritis    "in all my major bones" (10/05/2017)   Pneumonia    "several times" (10/05/2017)   Pyoderma gangrenosum    "skin autoimmune disorder" (  10/05/2017)   Seasonal allergies    Type II diabetes mellitus (HCC)    type 2   Past Surgical History:  Procedure Laterality Date   ANTERIOR CERVICAL DECOMP/DISCECTOMY FUSION  2008   BILATERAL CARPAL TUNNEL RELEASE Bilateral 2007   CORONARY CTO INTERVENTION  10/05/2017   CORONARY CTO INTERVENTION N/A 10/05/2017   Procedure: CORONARY CTO INTERVENTION;  Surgeon: Corky Crafts, MD;  Location: [facility] INVASIVE CV LAB;  Service: Cardiovascular;  Laterality: N/A;   CORONARY STENT INTERVENTION N/A 08/15/2016   Procedure: Coronary Stent Intervention;  Surgeon: Lyn Records, MD;  Location: [facility] INVASIVE CV LAB;  Service: Cardiovascular;  Laterality: N/A;   CORONARY STENT INTERVENTION N/A 11/09/2016   Procedure: Coronary Stent Intervention;  Surgeon: Lyn Records, MD;  Location: [facility] INVASIVE CV LAB;  Service:  Cardiovascular;  Laterality: N/A;   ECTOPIC PREGNANCY SURGERY  X 3   "last time they took my tubes out"   INCISION AND DRAINAGE     "had to clean MRSA off :RUE; 2 places on left inner leg; 1 place on back side of my right leg" (10/05/2017)   JOINT REPLACEMENT     KNEE ARTHROSCOPY Right X 2   LEFT HEART CATH AND CORONARY ANGIOGRAPHY N/A 08/15/2016   Procedure: Left Heart Cath and Coronary Angiography;  Surgeon: Lyn Records, MD;  Location: [facility] INVASIVE CV LAB;  Service: Cardiovascular;  Laterality: N/A;   LEFT HEART CATH AND CORONARY ANGIOGRAPHY N/A 11/09/2016   Procedure: Left Heart Cath and Coronary Angiography;  Surgeon: Lyn Records, MD;  Location: [facility] INVASIVE CV LAB;  Service: Cardiovascular;  Laterality: N/A;   LEFT HEART CATH AND CORONARY ANGIOGRAPHY N/A 04/19/2017   Procedure: LEFT HEART CATH AND CORONARY ANGIOGRAPHY;  Surgeon: Tonny Bollman, MD;  Location: [facility] INVASIVE CV LAB;  Service: Cardiovascular;  Laterality: N/A;   LEFT HEART CATH AND CORONARY ANGIOGRAPHY N/A 11/11/2021   Procedure: LEFT HEART CATH AND CORONARY ANGIOGRAPHY;  Surgeon: Lyn Records, MD;  Location: [facility] INVASIVE CV LAB;  Service: Cardiovascular;  Laterality: N/A;   TONSILLECTOMY     TOTAL KNEE ARTHROPLASTY Right    ULTRASOUND GUIDANCE FOR VASCULAR ACCESS  11/09/2016   Procedure: Ultrasound Guidance For Vascular Access;  Surgeon: Lyn Records, MD;  Location: [facility] INVASIVE CV LAB;  Service: Cardiovascular;;    Allergies  Allergies  Allergen Reactions   Anaprox [Naproxen Sodium] Shortness Of Breath and Other (See Comments)    Redness of skin and difficulty breathing    Aspirin Nausea And Vomiting and Other (See Comments)    Severe cramping, vomiting    Brilinta [Ticagrelor] Shortness Of Breath   Ceftin [Cefuroxime Axetil] Shortness Of Breath and Other (See Comments)    Skin turns red    Celexa [Citalopram Hydrobromide] Shortness Of Breath   Ciprofloxacin Shortness Of Breath and Other (See Comments)    Skin  turns red    Coconut Fragrance Hives, Shortness Of Breath, Swelling and Other (See Comments)    Reaction to any kind of coconut Mouth and tongue swelling   Contrast Media [Iodinated Contrast Media] Shortness Of Breath    Breathing difficulty   Cortisol [Hydrocortisone] Shortness Of Breath    Breathing difficulty and redness of skin   Dapsone Nausea And Vomiting    Extreme muscle weakness   Elavil [Amitriptyline] Shortness Of Breath     Redness of skin and blisters of skin Difficulty breathing   Hctz [Hydrochlorothiazide] Shortness Of Breath   Imitrex [Sumatriptan] Shortness Of Breath  Redness and difficulty breathing    Lexapro [Escitalopram Oxalate] Shortness Of Breath    Breathing difficulty Redness to skin   Methadone Nausea And Vomiting    Severely vomiting    Metrizamide Shortness Of Breath    Breathing difficulty   Motrin [Ibuprofen] Nausea And Vomiting   Naprosyn [Naproxen] Shortness Of Breath    Breathing difficulty Redness of skin   Neurontin [Gabapentin] Shortness Of Breath    Breathing difficulty  Redness to skin Breathing difficulty  Redness to skin   Penicillins Shortness Of Breath and Rash    Has patient had a PCN reaction causing immediate rash, facial/tongue/throat swelling, SOB or lightheadedness with hypotension: Yes Has patient had a PCN reaction causing severe rash involving mucus membranes or skin necrosis: Yes Has patient had a PCN reaction that required hospitalization: Yes Has patient had a PCN reaction occurring within the last 10 years: No If all of the above answers are "NO", then may proceed with Cephalosporin use.    Sulfa Antibiotics Shortness Of Breath    Severe breathing difficulty And redness of skin   Sulfasalazine Shortness Of Breath    Severe breathing difficulty And redness of skin   Tagamet [Cimetidine] Nausea And Vomiting   Toradol [Ketorolac Tromethamine] Shortness Of Breath    Redness to skin   Vancomycin Rash    Patient  Confirms Red mans syndome type rxn - Tolerated a slow infusion in past   Zoloft [Sertraline Hcl] Shortness Of Breath and Other (See Comments)    Breathing difficulty Redness to skin   Colcrys [Colchicine] Nausea And Vomiting   Iodine Swelling and Other (See Comments)    Redness to skin   Lipitor [Atorvastatin] Diarrhea   Ranexa [Ranolazine] Other (See Comments)    "Made symptoms (heart racing and pain) worse"     EKGs/Labs/Other Studies Reviewed:   The following studies were reviewed today: Cardiac catheterization 10/05/2017  Right Coronary Artery  The RCA is a large, dominant vessel. The vessel is totally occluded in the midportion after an RV marginal branch and proximal to the stent in the distal vessel. The PDA and PLA branches fill from left-sided collaterals. The vessel appears to reconstitute in its most distal portion just before the bifurcation of the PDA and PLA branches.  Mid RCA lesion is 100% stenosed.  Mid RCA to Dist RCA lesion is 100% stenosed. The lesion was previously treated.    Right Posterior Descending Artery    Intervention   Mid RCA lesion  Angioplasty  Balloon angioplasty was performed.  Post-Intervention Lesion Assessment  The intervention was unsuccessful due to inability to cross the lesion. Pre-interventional TIMI flow is 0. Post-intervention TIMI flow is 0.  There is a 100% residual stenosis post intervention.     Coronary Diagrams  Diagnostic Dominance: Right  Intervention   Implants    No implant documentation for this case.    EKG:  EKG is  ordered today.  The ekg ordered today demonstrates NSR rate 84bpm.   Recent Labs: 11/04/2021: BUN 17; Creatinine, Ser 0.74; Hemoglobin 16.7; Platelets 203; Potassium 4.5; Sodium 141  Recent Lipid Panel    Component Value Date/Time   CHOL 192 11/04/2021 1644   TRIG 252 (H) 11/04/2021 1644   HDL 31 (L) 11/04/2021 1644   CHOLHDL 6.2 (H) 11/04/2021 1644   CHOLHDL 6.9 08/16/2016 0342   VLDL 24  08/16/2016 0342   LDLCALC 117 (H) 11/04/2021 1644    Home Medications   No outpatient medications have been  marked as taking for the 11/22/21 encounter (Appointment) with Sharlene Dory, PA-C.     Review of Systems      All other systems reviewed and are otherwise negative except as noted above.  Physical Exam    VS:  LMP  (LMP Unknown)  , BMI There is no height or weight on file to calculate BMI.  Wt Readings from Last 3 Encounters:  11/11/21 225 lb (102.1 kg)  11/09/21 225 lb (102.1 kg)  11/04/21 235 lb (106.6 kg)     GEN: Well nourished, well developed, in no acute distress. HEENT: normal. Neck: Supple, no JVD, carotid bruits, or masses. Cardiac: RRR, no murmurs, rubs, or gallops. No clubbing, cyanosis, edema.  Radials/PT 2+ and equal bilaterally.  Respiratory:  Respirations regular and unlabored, clear to auscultation bilaterally. GI: Soft, nontender, nondistended. MS: No deformity or atrophy. Skin: Warm and dry, no rash. Neuro:  Strength and sensation are intact. Psych: Normal affect.  Assessment & Plan    CAD with history of MI 2018 -Lexiscan Myoview 12/21 with no ischemia -Patient with daily chest pain on Imdur and taking multiple nitroglycerin -Discussed another Lexiscan Myoview which she does not want to do.  I think she would need a cardiac catheterization anyways given her history -Discussed with Dr. Katrinka Blazing and planned cardiac catheterization  Hyperlipidemia -Needs an updated lipid panel -we will order this at the time of her cardiac cath labs  Hypertension -well controlled today -continue current medication regimen  Type 2 diabetes mellitus -recent A1C 8.4 -Needs better control   Disposition: Follow up after cardiac cath with Lesleigh Noe, MD or APP.  Signed, Sharlene Dory, PA-C 11/19/2021, 5:07 PM [facility]

## 2021-11-22 ENCOUNTER — Ambulatory Visit: Payer: Medicare Other | Admitting: Physician Assistant

## 2021-11-23 NOTE — H&P (Signed)
Please see the recent Office  note.

## 2021-11-29 NOTE — Progress Notes (Unsigned)
Office Visit    Patient Name: Brittney Banks Date of Encounter: 11/30/2021  PCP:  Ronita Hipps, MD   [place]  Cardiologist:  Sinclair Grooms, MD  Advanced Practice Provider:  No care team member to display Electrophysiologist:  None   Chief Complaint    Brittney Banks is a 51 y.o. female with a hx of hypertension, prior smoker (2012 quit), diabetes mellitus type 2, hyperlipidemia,.  She has known CAD with inferior STEMI treated with DES April 2018, reoccurring symptoms led to DES of high-grade small LAD July 2018, subsequent recurrent pain January 2019 demonstrating total occlusion of RCA with development of collaterals.  Failed CTO attempt on the right coronary July 2019.  She was felt to be not ideal for CABG due to questionable targets presents today for overdue follow-up appointment. Does heal well  She was last seen 02/2020 and at that time she been having some right neck pain.  She does have a history of cervical disc disease with fusion.  She describes the pain as shooting.  She tried nitroglycerin with relief.  She was worried about her heart at the time.  No nausea and vomiting, no shortness of breath.  A nuclear study was ordered at this time.  Stress test showed no ST segment deviation.  There was a medium sized, reversible defect in the entire inferior wall consistent with prior infarct but no ischemia.    When she was last seen in the office on 7/20 she was having chest pains every day.  She is on max dose Imdur and has to usually take 1-3 nitros.  Her pain can happen when she is up moving around and then also not moving.  It usually lasts about 30 seconds to a minute and then she gets to one of her nitros and it relieves the pain.  The pain is usually in the center of her chest and then shoots to her back.  She states a couple years back she had an MRI of her back and she took a few Xanax to calm herself down.  She states that Xanax helped and she did  not have any chest pain.  The pain could certainly be due to a combination of coronary disease and anxiety.  We will work-up the coronary disease but I will leave it up to her primary care to work-up her anxiety.  Today, she continues to have multiple bouts of chest pain a day and continues to require 3 nitroglycerin to subside pain.  She is on max dose Imdur.  She states that after 3 nitroglycerin her pain usually subsides.  It is recommended that she start a calcium channel blocker when in the hospital.  It does not appear that it was ever started.  We reviewed her cardiac catheterization which showed total occlusion of the right coronary artery but there is collateral blood flow from left to right.  Her LAD stent is widely patent.  Reports no shortness of breath nor dyspnea on exertion. No edema, orthopnea, PND. Reports no palpitations.    Past Medical History    Past Medical History:  Diagnosis Date   Behcet's disease ([place])    "similiar to MS" (10/05/2017)   Cellulitis 03/2016   Chronic back pain    "all over"   Chronic bronchitis ([place])    Coronary artery disease    11/09/16 PCI with DES to mLAD, patent RCA stent   DDD (degenerative disc disease), lumbar  GERD (gastroesophageal reflux disease)    Hyperlipidemia    Hypertension    LV dysfunction    MI (myocardial infarction) ([place]) 10/2016   Migraine    "quite a bit when I was younger; now maybe 1/year" (10/05/2017)   MRSA carrier 2008   MRSA infection 2008   BOTH LEGS   Myocardial infarction involving right coronary artery ([place]) 08/15/2016   On home oxygen therapy    "3L prn" (10/05/2017)   Osteoarthritis    "in all my major bones" (10/05/2017)   Pneumonia    "several times" (10/05/2017)   Pyoderma gangrenosum    "skin autoimmune disorder" (10/05/2017)   Seasonal allergies    Type II diabetes mellitus ([place])    type 2   Past Surgical History:  Procedure Laterality Date   ANTERIOR CERVICAL DECOMP/DISCECTOMY FUSION  2008    BILATERAL CARPAL TUNNEL RELEASE Bilateral 2007   CORONARY CTO INTERVENTION  10/05/2017   CORONARY CTO INTERVENTION N/A 10/05/2017   Procedure: CORONARY CTO INTERVENTION;  Surgeon: Jettie Booze, MD;  Location: [facility];  Service: Cardiovascular;  Laterality: N/A;   CORONARY STENT INTERVENTION N/A 08/15/2016   Procedure: Coronary Stent Intervention;  Surgeon: Belva Crome, MD;  Location: [facility];  Service: Cardiovascular;  Laterality: N/A;   CORONARY STENT INTERVENTION N/A 11/09/2016   Procedure: Coronary Stent Intervention;  Surgeon: Belva Crome, MD;  Location: [facility];  Service: Cardiovascular;  Laterality: N/A;   ECTOPIC PREGNANCY SURGERY  X 3   "last time they took my tubes out"   INCISION AND DRAINAGE     "had to clean MRSA off :RUE; 2 places on left inner leg; 1 place on back side of my right leg" (10/05/2017)   JOINT REPLACEMENT     KNEE ARTHROSCOPY Right X 2   LEFT HEART CATH AND CORONARY ANGIOGRAPHY N/A 08/15/2016   Procedure: Left Heart Cath and Coronary Angiography;  Surgeon: Belva Crome, MD;  Location: [facility];  Service: Cardiovascular;  Laterality: N/A;   LEFT HEART CATH AND CORONARY ANGIOGRAPHY N/A 11/09/2016   Procedure: Left Heart Cath and Coronary Angiography;  Surgeon: Belva Crome, MD;  Location: [facility];  Service: Cardiovascular;  Laterality: N/A;   LEFT HEART CATH AND CORONARY ANGIOGRAPHY N/A 04/19/2017   Procedure: LEFT HEART CATH AND CORONARY ANGIOGRAPHY;  Surgeon: Sherren Mocha, MD;  Location: [facility];  Service: Cardiovascular;  Laterality: N/A;   LEFT HEART CATH AND CORONARY ANGIOGRAPHY N/A 11/11/2021   Procedure: LEFT HEART CATH AND CORONARY ANGIOGRAPHY;  Surgeon: Belva Crome, MD;  Location: [facility];  Service: Cardiovascular;  Laterality: N/A;   TONSILLECTOMY     TOTAL KNEE ARTHROPLASTY Right    ULTRASOUND GUIDANCE FOR VASCULAR ACCESS  11/09/2016   Procedure: Ultrasound Guidance For  Vascular Access;  Surgeon: Belva Crome, MD;  Location: [facility];  Service: Cardiovascular;;    Allergies  Allergies  Allergen Reactions   Anaprox [Naproxen Sodium] Shortness Of Breath and Other (See Comments)    Redness of skin and difficulty breathing    Aspirin Nausea And Vomiting and Other (See Comments)    Severe cramping, vomiting    Brilinta [Ticagrelor] Shortness Of Breath   Ceftin [Cefuroxime Axetil] Shortness Of Breath and Other (See Comments)    Skin turns red    Celexa [Citalopram Hydrobromide] Shortness Of Breath   Ciprofloxacin Shortness Of Breath and Other (See Comments)  Skin turns red    Coconut Fragrance Hives, Shortness Of Breath, Swelling and Other (See Comments)    Reaction to any kind of coconut Mouth and tongue swelling   Contrast Media [Iodinated Contrast Media] Shortness Of Breath    Breathing difficulty   Cortisol [Hydrocortisone] Shortness Of Breath    Breathing difficulty and redness of skin   Dapsone Nausea And Vomiting    Extreme muscle weakness   Elavil [Amitriptyline] Shortness Of Breath     Redness of skin and blisters of skin Difficulty breathing   Hctz [Hydrochlorothiazide] Shortness Of Breath   Imitrex [Sumatriptan] Shortness Of Breath    Redness and difficulty breathing    Lexapro [Escitalopram Oxalate] Shortness Of Breath    Breathing difficulty Redness to skin   Methadone Nausea And Vomiting    Severely vomiting    Metrizamide Shortness Of Breath    Breathing difficulty   Motrin [Ibuprofen] Nausea And Vomiting   Naprosyn [Naproxen] Shortness Of Breath    Breathing difficulty Redness of skin   Neurontin [Gabapentin] Shortness Of Breath    Breathing difficulty  Redness to skin Breathing difficulty  Redness to skin   Penicillins Shortness Of Breath and Rash    Has patient had a PCN reaction causing immediate rash, facial/tongue/throat swelling, SOB or lightheadedness with hypotension: Yes Has patient had a PCN  reaction causing severe rash involving mucus membranes or skin necrosis: Yes Has patient had a PCN reaction that required hospitalization: Yes Has patient had a PCN reaction occurring within the last 10 years: No If all of the above answers are "NO", then may proceed with Cephalosporin use.    Sulfa Antibiotics Shortness Of Breath    Severe breathing difficulty And redness of skin   Sulfasalazine Shortness Of Breath    Severe breathing difficulty And redness of skin   Tagamet [Cimetidine] Nausea And Vomiting   Toradol [Ketorolac Tromethamine] Shortness Of Breath    Redness to skin   Vancomycin Rash    Patient Confirms Red mans syndome type rxn - Tolerated a slow infusion in past   Zoloft [Sertraline Hcl] Shortness Of Breath and Other (See Comments)    Breathing difficulty Redness to skin   Colcrys [Colchicine] Nausea And Vomiting   Iodine Swelling and Other (See Comments)    Redness to skin   Lipitor [Atorvastatin] Diarrhea   Ranexa [Ranolazine] Other (See Comments)    "Made symptoms (heart racing and pain) worse"     EKGs/Labs/Other Studies Reviewed:   The following studies were reviewed today: Cardiac catheterization 10/05/2017  Right Coronary Artery  The RCA is a large, dominant vessel. The vessel is totally occluded in the midportion after an RV marginal branch and proximal to the stent in the distal vessel. The PDA and PLA branches fill from left-sided collaterals. The vessel appears to reconstitute in its most distal portion just before the bifurcation of the PDA and PLA branches.  Mid RCA lesion is 100% stenosed.  Mid RCA to Dist RCA lesion is 100% stenosed. The lesion was previously treated.    Right Posterior Descending Artery    Intervention   Mid RCA lesion  Angioplasty  Balloon angioplasty was performed.  Post-Intervention Lesion Assessment  The intervention was unsuccessful due to inability to cross the lesion. Pre-interventional TIMI flow is 0.  Post-intervention TIMI flow is 0.  There is a 100% residual stenosis post intervention.     Coronary Diagrams  Diagnostic Dominance: Right  Intervention   Implants    No  implant documentation for this case.    EKG:  EKG is  ordered today.  The ekg ordered today demonstrates NSR rate 84bpm.   Recent Labs: 11/04/2021: BUN 17; Creatinine, Ser 0.74; Hemoglobin 16.7; Platelets 203; Potassium 4.5; Sodium 141  Recent Lipid Panel    Component Value Date/Time   CHOL 192 11/04/2021 1644   TRIG 252 (H) 11/04/2021 1644   HDL 31 (L) 11/04/2021 1644   CHOLHDL 6.2 (H) 11/04/2021 1644   CHOLHDL 6.9 08/16/2016 0342   VLDL 24 08/16/2016 0342   LDLCALC 117 (H) 11/04/2021 1644    Home Medications   Current Meds  Medication Sig   carvedilol (COREG) 25 MG tablet take 1 tablet by mouth twice a day.   cetirizine (ZYRTEC) 10 MG tablet Take 10 mg by mouth in the morning.   clopidogrel (PLAVIX) 75 MG tablet Take 1 tablet (75 mg total) by mouth daily.   Dexlansoprazole (DEXILANT) 30 MG capsule DR Take 1 capsule (30 mg total) by mouth daily.   diphenhydrAMINE (BENADRYL) 25 MG tablet Take 50 mg by mouth daily as needed for allergies.   diphenhydrAMINE (BENADRYL) 50 MG capsule Take one capsule 1 hour prior to cath   glucose monitoring kit (FREESTYLE) monitoring kit 1 each by Does not apply route as needed for other. Dispense any model that is covered- dispense testing supplies for Q AC/ HS accuchecks- 1 month supply with one refil.   isosorbide mononitrate (IMDUR) 60 MG 24 hr tablet Take 2 tablets (120 mg total) by mouth daily.   metFORMIN (GLUCOPHAGE) 850 MG tablet Take 850 mg by mouth 2 (two) times daily before a meal.   morphine (MS CONTIN) 60 MG 12 hr tablet Take 60 mg by mouth every 8 (eight) hours.   neomycin-bacitracin-polymyxin (NEOSPORIN) ointment Apply 1 application topically as needed for wound care.   nitroGLYCERIN (NITROSTAT) 0.4 MG SL tablet Place 1 tablet (0.4 mg total) under the  tongue every 5 (five) minutes x 3 doses as needed for chest pain.   Oxycodone HCl 20 MG TABS Take 20 mg by mouth every 4 (four) hours as needed (pain.).   oxymetazoline (AFRIN) 0.05 % nasal spray Place 1 spray into both nostrils daily as needed for congestion.   predniSONE (DELTASONE) 50 MG tablet Take one tablet 13 hours, 7 hours, and 1 hour prior to cath   rosuvastatin (CRESTOR) 40 MG tablet Take 1 tablet (40 mg total) by mouth daily.   [DISCONTINUED] lisinopril (ZESTRIL) 20 MG tablet Take 1 tablet (20 mg total) by mouth daily.     Review of Systems      All other systems reviewed and are otherwise negative except as noted above.  Physical Exam    VS:  BP 112/70   Pulse 85   Ht 5' 8.5" (1.74 m)   Wt 239 lb (108.4 kg)   LMP  (LMP Unknown)   SpO2 98%   BMI 35.81 kg/m  , BMI Body mass index is 35.81 kg/m.  Wt Readings from Last 3 Encounters:  11/30/21 239 lb (108.4 kg)  11/11/21 225 lb (102.1 kg)  11/09/21 225 lb (102.1 kg)     GEN: Well nourished, well developed, in no acute distress. HEENT: normal. Neck: Supple, no JVD, carotid bruits, or masses. Cardiac: RRR, no murmurs, rubs, or gallops. No clubbing, cyanosis, edema.  Radials/PT 2+ and equal bilaterally.  Respiratory:  Respirations regular and unlabored, clear to auscultation bilaterally. GI: Soft, nontender, nondistended. MS: No deformity or atrophy. Skin: Warm and  dry, no rash. Neuro:  Strength and sensation are intact. Psych: Normal affect.  Assessment & Plan    CAD with history of MI 2018 -Lexiscan Myoview 12/21 with no ischemia -Patient with daily chest pain on Imdur and taking multiple nitroglycerin -Cardiac catheterization 11/11/2021 showed total occlusion of RCA with ring in-stent stenosis.  Collaterals from left to right.  Patent LAD stent.  Recommended medical therapy. -She is on Plavix 75 mg daily, Coreg 25 mg twice daily, Imdur 120 mg daily, lisinopril 20 mg daily, nitroglycerin tabs 0.4 mg sublingual as  needed, Crestor 40 mg daily -She has multiple medication allergies which makes medical management challenging -We will plan to decrease lisinopril to 10 mg daily and start her on Norvasc 2.5 mg daily and titrate up accordingly -Asked her to keep track of her blood pressure -We will also order an echocardiogram to further evaluate heart function and valvular function.   Hyperlipidemia -Needs an updated lipid panel -Triglycerides above goal at 252 -We will order Vascepa 2 g twice daily -recheck in 6-8 weeks  Hypertension -well controlled today -Decrease lisinopril to 10 mg daily and add Norvasc 2.5 mg daily  Type 2 diabetes mellitus -recent A1C 8.4 -Needs better control   Disposition: Follow up after cardiac cath with Sinclair Grooms, MD or APP.  Signed, Elgie Collard, PA-C 11/30/2021, 3:38 PM [facility]

## 2021-11-30 ENCOUNTER — Ambulatory Visit (INDEPENDENT_AMBULATORY_CARE_PROVIDER_SITE_OTHER): Payer: Medicare Other | Admitting: Physician Assistant

## 2021-11-30 ENCOUNTER — Encounter: Payer: Self-pay | Admitting: Physician Assistant

## 2021-11-30 VITALS — BP 112/70 | HR 85 | Ht 68.5 in | Wt 239.0 lb

## 2021-11-30 DIAGNOSIS — E108 Type 1 diabetes mellitus with unspecified complications: Secondary | ICD-10-CM

## 2021-11-30 DIAGNOSIS — I10 Essential (primary) hypertension: Secondary | ICD-10-CM

## 2021-11-30 DIAGNOSIS — I200 Unstable angina: Secondary | ICD-10-CM

## 2021-11-30 DIAGNOSIS — T8089XA Other complications following infusion, transfusion and therapeutic injection, initial encounter: Secondary | ICD-10-CM

## 2021-11-30 DIAGNOSIS — R079 Chest pain, unspecified: Secondary | ICD-10-CM | POA: Diagnosis not present

## 2021-11-30 DIAGNOSIS — E785 Hyperlipidemia, unspecified: Secondary | ICD-10-CM

## 2021-11-30 DIAGNOSIS — Y848 Other medical procedures as the cause of abnormal reaction of the patient, or of later complication, without mention of misadventure at the time of the procedure: Secondary | ICD-10-CM

## 2021-11-30 DIAGNOSIS — I25110 Atherosclerotic heart disease of native coronary artery with unstable angina pectoris: Secondary | ICD-10-CM

## 2021-11-30 MED ORDER — LISINOPRIL 10 MG PO TABS: 10.0000 mg | ORAL_TABLET | Freq: Every day | ORAL | 3 refills | Status: DC

## 2021-11-30 MED ORDER — ICOSAPENT ETHYL 1 G PO CAPS: 2.0000 g | ORAL_CAPSULE | Freq: Two times a day (BID) | ORAL | 11 refills | Status: DC

## 2021-11-30 MED ORDER — AMLODIPINE BESYLATE 2.5 MG PO TABS: 2.5000 mg | ORAL_TABLET | Freq: Every day | ORAL | 3 refills | Status: DC

## 2021-11-30 MED ORDER — DEXLANSOPRAZOLE 30 MG PO CPDR: 30.0000 mg | DELAYED_RELEASE_CAPSULE | Freq: Every day | ORAL | 0 refills | Status: DC

## 2021-11-30 NOTE — Patient Instructions (Addendum)
Medication Instructions:  1.Decrease lisinopril to 10 mg daily 2.Start amlodipine (Norvasc) 2.5 mg daily 3.Start vascepa 2 grams twice daily *If you need a refill on your cardiac medications before your next appointment, please call your pharmacy*   Lab Work: Fasting lipid panel as scheduled on 01/10/22 If you have labs (blood work) drawn today and your tests are completely normal, you will receive your results only by: MyChart Message (if you have MyChart) OR A paper copy in the mail If you have any lab test that is abnormal or we need to change your treatment, we will call you to review the results.  Testing: Your physician has requested that you have an echocardiogram. Echocardiography is a painless test that uses sound waves to create images of your heart. It provides your doctor with information about the size and shape of your heart and how well your heart's chambers and valves are working. This procedure takes approximately one hour. There are no restrictions for this procedure.  Follow-Up: At [facility], you and your health needs are our priority.  As part of our continuing mission to provide you with exceptional heart care, we have created designated Provider Care Teams.  These Care Teams include your primary Cardiologist (physician) and Advanced Practice Providers (APPs -  Physician Assistants and Nurse Practitioners) who all work together to provide you with the care you need, when you need it.  We recommend signing up for the patient portal called "MyChart".  Sign up information is provided on this After Visit Summary.  MyChart is used to connect with patients for Virtual Visits (Telemedicine).  Patients are able to view lab/test results, encounter notes, upcoming appointments, etc.  Non-urgent messages can be sent to your provider as well.   To learn more about what you can do with MyChart, go to [email or web address].    Your next appointment:   3-4 week   The format for  your next appointment:   In Person  Provider:   Lesleigh Noe, MD or APP  Important Information About Sugar

## 2021-12-06 ENCOUNTER — Other Ambulatory Visit: Payer: Self-pay | Admitting: Interventional Cardiology

## 2021-12-06 DIAGNOSIS — M5030 Other cervical disc degeneration, unspecified cervical region: Secondary | ICD-10-CM | POA: Diagnosis not present

## 2021-12-06 DIAGNOSIS — M5136 Other intervertebral disc degeneration, lumbar region: Secondary | ICD-10-CM | POA: Diagnosis not present

## 2021-12-06 DIAGNOSIS — M1990 Unspecified osteoarthritis, unspecified site: Secondary | ICD-10-CM | POA: Diagnosis not present

## 2021-12-06 DIAGNOSIS — G894 Chronic pain syndrome: Secondary | ICD-10-CM | POA: Diagnosis not present

## 2021-12-10 DIAGNOSIS — M1990 Unspecified osteoarthritis, unspecified site: Secondary | ICD-10-CM | POA: Diagnosis not present

## 2021-12-10 DIAGNOSIS — M47819 Spondylosis without myelopathy or radiculopathy, site unspecified: Secondary | ICD-10-CM | POA: Diagnosis not present

## 2021-12-13 ENCOUNTER — Ambulatory Visit (HOSPITAL_COMMUNITY): Payer: Medicare Other

## 2021-12-13 ENCOUNTER — Telehealth (HOSPITAL_COMMUNITY): Payer: Self-pay | Admitting: Physician Assistant

## 2021-12-13 NOTE — Telephone Encounter (Signed)
Patient called and cancelled echocardiogram for today due to she is sick. She doesn't know when she can reschedule and will call us back when she is ready. Order will be removed from the echo wq and when patient calls back we will reinstate the order.

## 2021-12-27 NOTE — Progress Notes (Deleted)
Office Visit    Patient Name: Brittney Banks Date of Encounter: 12/27/2021  Primary Care Provider:  Ronita Hipps, MD Primary Cardiologist:  Sinclair Grooms, MD Primary Electrophysiologist: None  Chief Complaint    Conception R Lantigua is a 51 y.o. female with PMH of CAD s/p inferior STEMI 2018 treated with DES to LAD, HTN DM type II, HLD who presents today for 4-week follow-up of coronary artery disease.  Past Medical History    Past Medical History:  Diagnosis Date   Behcet's disease ([place])    "similiar to MS" (10/05/2017)   Cellulitis 03/2016   Chronic back pain    "all over"   Chronic bronchitis ([place])    Coronary artery disease    11/09/16 PCI with DES to mLAD, patent RCA stent   DDD (degenerative disc disease), lumbar    GERD (gastroesophageal reflux disease)    Hyperlipidemia    Hypertension    LV dysfunction    MI (myocardial infarction) ([place]) 10/2016   Migraine    "quite a bit when I was younger; now maybe 1/year" (10/05/2017)   MRSA carrier 2008   MRSA infection 2008   BOTH LEGS   Myocardial infarction involving right coronary artery ([place]) 08/15/2016   On home oxygen therapy    "3L prn" (10/05/2017)   Osteoarthritis    "in all my major bones" (10/05/2017)   Pneumonia    "several times" (10/05/2017)   Pyoderma gangrenosum    "skin autoimmune disorder" (10/05/2017)   Seasonal allergies    Type II diabetes mellitus ([place])    type 2   Past Surgical History:  Procedure Laterality Date   ANTERIOR CERVICAL DECOMP/DISCECTOMY FUSION  2008   BILATERAL CARPAL TUNNEL RELEASE Bilateral 2007   CORONARY CTO INTERVENTION  10/05/2017   CORONARY CTO INTERVENTION N/A 10/05/2017   Procedure: CORONARY CTO INTERVENTION;  Surgeon: Jettie Booze, MD;  Location: [facility];  Service: Cardiovascular;  Laterality: N/A;   CORONARY STENT INTERVENTION N/A 08/15/2016   Procedure: Coronary Stent Intervention;  Surgeon: Belva Crome, MD;  Location: [facility];  Service:  Cardiovascular;  Laterality: N/A;   CORONARY STENT INTERVENTION N/A 11/09/2016   Procedure: Coronary Stent Intervention;  Surgeon: Belva Crome, MD;  Location: [facility];  Service: Cardiovascular;  Laterality: N/A;   ECTOPIC PREGNANCY SURGERY  X 3   "last time they took my tubes out"   INCISION AND DRAINAGE     "had to clean MRSA off :RUE; 2 places on left inner leg; 1 place on back side of my right leg" (10/05/2017)   JOINT REPLACEMENT     KNEE ARTHROSCOPY Right X 2   LEFT HEART CATH AND CORONARY ANGIOGRAPHY N/A 08/15/2016   Procedure: Left Heart Cath and Coronary Angiography;  Surgeon: Belva Crome, MD;  Location: [facility];  Service: Cardiovascular;  Laterality: N/A;   LEFT HEART CATH AND CORONARY ANGIOGRAPHY N/A 11/09/2016   Procedure: Left Heart Cath and Coronary Angiography;  Surgeon: Belva Crome, MD;  Location: [facility];  Service: Cardiovascular;  Laterality: N/A;   LEFT HEART CATH AND CORONARY ANGIOGRAPHY N/A 04/19/2017   Procedure: LEFT HEART CATH AND CORONARY ANGIOGRAPHY;  Surgeon: Sherren Mocha, MD;  Location: [facility];  Service: Cardiovascular;  Laterality: N/A;   LEFT HEART CATH AND CORONARY ANGIOGRAPHY N/A 11/11/2021   Procedure: LEFT HEART CATH AND CORONARY ANGIOGRAPHY;  Surgeon: Belva Crome, MD;  Location: [facility]  INVASIVE [facility];  Service: Cardiovascular;  Laterality: N/A;   TONSILLECTOMY     TOTAL KNEE ARTHROPLASTY Right    ULTRASOUND GUIDANCE FOR VASCULAR ACCESS  11/09/2016   Procedure: Ultrasound Guidance For Vascular Access;  Surgeon: Belva Crome, MD;  Location: [facility];  Service: Cardiovascular;;    Allergies  Allergies  Allergen Reactions   Anaprox [Naproxen Sodium] Shortness Of Breath and Other (See Comments)    Redness of skin and difficulty breathing    Aspirin Nausea And Vomiting and Other (See Comments)    Severe cramping, vomiting    Brilinta [Ticagrelor] Shortness Of Breath   Ceftin [Cefuroxime Axetil]  Shortness Of Breath and Other (See Comments)    Skin turns red    Celexa [Citalopram Hydrobromide] Shortness Of Breath   Ciprofloxacin Shortness Of Breath and Other (See Comments)    Skin turns red    Coconut Fragrance Hives, Shortness Of Breath, Swelling and Other (See Comments)    Reaction to any kind of coconut Mouth and tongue swelling   Contrast Media [Iodinated Contrast Media] Shortness Of Breath    Breathing difficulty   Cortisol [Hydrocortisone] Shortness Of Breath    Breathing difficulty and redness of skin   Dapsone Nausea And Vomiting    Extreme muscle weakness   Elavil [Amitriptyline] Shortness Of Breath     Redness of skin and blisters of skin Difficulty breathing   Hctz [Hydrochlorothiazide] Shortness Of Breath   Imitrex [Sumatriptan] Shortness Of Breath    Redness and difficulty breathing    Lexapro [Escitalopram Oxalate] Shortness Of Breath    Breathing difficulty Redness to skin   Methadone Nausea And Vomiting    Severely vomiting    Metrizamide Shortness Of Breath    Breathing difficulty   Motrin [Ibuprofen] Nausea And Vomiting   Naprosyn [Naproxen] Shortness Of Breath    Breathing difficulty Redness of skin   Neurontin [Gabapentin] Shortness Of Breath    Breathing difficulty  Redness to skin Breathing difficulty  Redness to skin   Penicillins Shortness Of Breath and Rash    Has patient had a PCN reaction causing immediate rash, facial/tongue/throat swelling, SOB or lightheadedness with hypotension: Yes Has patient had a PCN reaction causing severe rash involving mucus membranes or skin necrosis: Yes Has patient had a PCN reaction that required hospitalization: Yes Has patient had a PCN reaction occurring within the last 10 years: No If all of the above answers are "NO", then may proceed with Cephalosporin use.    Sulfa Antibiotics Shortness Of Breath    Severe breathing difficulty And redness of skin   Sulfasalazine Shortness Of Breath    Severe  breathing difficulty And redness of skin   Tagamet [Cimetidine] Nausea And Vomiting   Toradol [Ketorolac Tromethamine] Shortness Of Breath    Redness to skin   Vancomycin Rash    Patient Confirms Red mans syndome type rxn - Tolerated a slow infusion in past   Zoloft [Sertraline Hcl] Shortness Of Breath and Other (See Comments)    Breathing difficulty Redness to skin   Colcrys [Colchicine] Nausea And Vomiting   Iodine Swelling and Other (See Comments)    Redness to skin   Lipitor [Atorvastatin] Diarrhea   Ranexa [Ranolazine] Other (See Comments)    "Made symptoms (heart racing and pain) worse"    History of Present Illness    Brittney Banks has a PMH of is a 51 year old female with the above mention past medical history who presents  today for follow-up of coronary artery disease.  She has extensive history of coronary artery disease with inferior STEMI that was treated with DES and 07/2016.  She had recurring symptoms in 10/2016 and was found to have high-grade small LAD that was treated with Onyx DES x1.  In 04/2017 she presented for staged outpatient CTO that was unsuccessful.  She had 2D echo completed that showed EF 55-60%, and no valvular abnormalities.  She was seen in follow-up by Dr. Tamala Julian and had complaint of chest tightness with exertion.  She was sent for [facility] that showed medium size reversible defect in the entire inferior wall that was consistent with prior infarct.  She was last seen by Nicholes Rough, PA on 10/2021 for overdue follow-up.  During visit she endorsed chest pain that occurs almost every day especially when moving.  She reports that it lasts 30 seconds to 1 minute and is relieved with nitroglycerin.  She is on max dose of Imdur currently and case was discussed with Dr. Tamala Julian who recommended left heart cath.  Cath revealed mid RCA occlusion with left-to-right collaterals, 40-50% proximal to mid large first diagonal with ramus intermedius that contained irregularities  and is collateralized by large left ventricular branch.  Medical therapy was recommended for inotropic support that will hopefully help reduce heart rate.  Since last being seen in the office patient reports***.  Patient denies chest pain, palpitations, dyspnea, PND, orthopnea, nausea, vomiting, dizziness, syncope, edema, weight gain, or early satiety.   ***Notes:  Home Medications    Current Outpatient Medications  Medication Sig Dispense Refill   amLODipine (NORVASC) 2.5 MG tablet Take 1 tablet (2.5 mg total) by mouth daily. 90 tablet 3   carvedilol (COREG) 25 MG tablet take 1 tablet by mouth twice a day. 180 tablet 3   cetirizine (ZYRTEC) 10 MG tablet Take 10 mg by mouth in the morning.     clopidogrel (PLAVIX) 75 MG tablet Take 1 tablet (75 mg total) by mouth daily. 90 tablet 3   Dexlansoprazole (DEXILANT) 30 MG capsule DR Take 1 capsule (30 mg total) by mouth daily. Further refills to be granted by PCP 30 capsule 0   diphenhydrAMINE (BENADRYL) 25 MG tablet Take 50 mg by mouth daily as needed for allergies.     diphenhydrAMINE (BENADRYL) 50 MG capsule Take one capsule 1 hour prior to cath 1 capsule 0   glucose monitoring kit (FREESTYLE) monitoring kit 1 each by Does not apply route as needed for other. Dispense any model that is covered- dispense testing supplies for Q AC/ HS accuchecks- 1 month supply with one refil. 1 each 1   icosapent Ethyl (VASCEPA) 1 g capsule Take 2 capsules (2 g total) by mouth 2 (two) times daily. 120 capsule 11   isosorbide mononitrate (IMDUR) 60 MG 24 hr tablet Take 2 tablets (120 mg total) by mouth daily. 180 tablet 3   lisinopril (ZESTRIL) 10 MG tablet Take 1 tablet (10 mg total) by mouth daily. 90 tablet 3   metFORMIN (GLUCOPHAGE) 850 MG tablet Take 850 mg by mouth 2 (two) times daily before a meal.     morphine (MS CONTIN) 60 MG 12 hr tablet Take 60 mg by mouth every 8 (eight) hours.     neomycin-bacitracin-polymyxin (NEOSPORIN) ointment Apply 1 application  topically as needed for wound care.     nitroGLYCERIN (NITROSTAT) 0.4 MG SL tablet Place 1 tablet (0.4 mg total) under the tongue every 5 (five) minutes x 3 doses as  needed for chest pain. 25 tablet 3   Oxycodone HCl 20 MG TABS Take 20 mg by mouth every 4 (four) hours as needed (pain.).     oxymetazoline (AFRIN) 0.05 % nasal spray Place 1 spray into both nostrils daily as needed for congestion.     predniSONE (DELTASONE) 50 MG tablet Take one tablet 13 hours, 7 hours, and 1 hour prior to cath 3 tablet 0   rosuvastatin (CRESTOR) 40 MG tablet Take 1 tablet (40 mg total) by mouth daily. 90 tablet 3   No current facility-administered medications for this visit.     Review of Systems  Please see the history of present illness.    (+)*** (+)***  All other systems reviewed and are otherwise negative except as noted above.  Physical Exam    Wt Readings from Last 3 Encounters:  11/30/21 239 lb (108.4 kg)  11/11/21 225 lb (102.1 kg)  11/09/21 225 lb (102.1 kg)   YS:AYTKZ were no vitals filed for this visit.,There is no height or weight on file to calculate BMI.  Constitutional:      Appearance: Healthy appearance. Not in distress.  Neck:     Vascular: JVD normal.  Pulmonary:     Effort: Pulmonary effort is normal.     Breath sounds: No wheezing. No rales. Diminished in the bases Cardiovascular:     Normal rate. Regular rhythm. Normal S1. Normal S2.      Murmurs: There is no murmur.  Edema:    Peripheral edema absent.  Abdominal:     Palpations: Abdomen is soft non tender. There is no hepatomegaly.  Skin:    General: Skin is warm and dry.  Neurological:     General: No focal deficit present.     Mental Status: Alert and oriented to person, place and time.     Cranial Nerves: Cranial nerves are intact.  EKG/LABS/Other Studies Reviewed    ECG personally reviewed by me today - ***  Risk Assessment/Calculations:   {Does this patient have ATRIAL FIBRILLATION?:[id number]}         Lab Results  Component Value Date   WBC 12.8 (H) 11/04/2021   HGB 16.7 (H) 11/04/2021   HCT 48.4 (H) 11/04/2021   MCV 90 11/04/2021   PLT 203 11/04/2021   Lab Results  Component Value Date   CREATININE 0.74 11/04/2021   BUN 17 11/04/2021   NA 141 11/04/2021   K 4.5 11/04/2021   CL 101 11/04/2021   CO2 25 11/04/2021   Lab Results  Component Value Date   ALT 22 08/01/2019   AST 20 08/01/2019   ALKPHOS 73 08/01/2019   BILITOT 0.4 08/01/2019   Lab Results  Component Value Date   CHOL 192 11/04/2021   HDL 31 (L) 11/04/2021   LDLCALC 117 (H) 11/04/2021   TRIG 252 (H) 11/04/2021   CHOLHDL 6.2 (H) 11/04/2021    Lab Results  Component Value Date   HGBA1C 8.4 (H) 06/13/2019    Assessment & Plan    1.  Coronary artery disease: -s/p mid RCA occlusion with left-to-right collaterals, 40-50% proximal to mid large first diagonal with ramus intermedius that contained irregularities and is collateralized by large left ventricular branch.  -GDMT with Plavix 75 mg Coreg 25 mg twice daily isosorbide 60 mg twice daily, Crestor 40 mg daily  2.  Hyperlipidemia: -Patient's LDL cholesterol was 117 -Crestor 40 mg daily  3.  Hypertension: -Blood pressure today was*** -Continue carvedilol as noted above and lisinopril 10  mg daily  4.  DM type II: -Patient's last hemoglobin A1c was      Disposition: Follow-up with Belva Crome III, MD or APP in *** months {Are you ordering a CV Procedure (e.g. stress test, cath, DCCV, TEE, etc)?   Press F2        :[id number]}   Medication Adjustments/Labs and Tests Ordered: Current medicines are reviewed at length with the patient today.  Concerns regarding medicines are outlined above.   Signed, Mable Fill, Marissa Nestle, NP 12/27/2021, 8:10 AM [place]

## 2021-12-30 ENCOUNTER — Ambulatory Visit: Payer: Medicare Other | Attending: Nurse Practitioner | Admitting: Nurse Practitioner

## 2021-12-30 DIAGNOSIS — I25110 Atherosclerotic heart disease of native coronary artery with unstable angina pectoris: Secondary | ICD-10-CM | POA: Insufficient documentation

## 2022-01-04 DIAGNOSIS — M5030 Other cervical disc degeneration, unspecified cervical region: Secondary | ICD-10-CM | POA: Diagnosis not present

## 2022-01-04 DIAGNOSIS — M5136 Other intervertebral disc degeneration, lumbar region: Secondary | ICD-10-CM | POA: Diagnosis not present

## 2022-01-04 DIAGNOSIS — M1990 Unspecified osteoarthritis, unspecified site: Secondary | ICD-10-CM | POA: Diagnosis not present

## 2022-01-04 DIAGNOSIS — G894 Chronic pain syndrome: Secondary | ICD-10-CM | POA: Diagnosis not present

## 2022-01-07 DIAGNOSIS — L089 Local infection of the skin and subcutaneous tissue, unspecified: Secondary | ICD-10-CM | POA: Diagnosis not present

## 2022-01-09 DIAGNOSIS — M1990 Unspecified osteoarthritis, unspecified site: Secondary | ICD-10-CM | POA: Diagnosis not present

## 2022-01-09 DIAGNOSIS — M47819 Spondylosis without myelopathy or radiculopathy, site unspecified: Secondary | ICD-10-CM | POA: Diagnosis not present

## 2022-01-10 ENCOUNTER — Other Ambulatory Visit: Payer: Medicare Other

## 2022-01-10 DIAGNOSIS — Z6834 Body mass index (BMI) 34.0-34.9, adult: Secondary | ICD-10-CM | POA: Diagnosis not present

## 2022-01-10 DIAGNOSIS — L0390 Cellulitis, unspecified: Secondary | ICD-10-CM | POA: Diagnosis not present

## 2022-01-24 DIAGNOSIS — L0390 Cellulitis, unspecified: Secondary | ICD-10-CM | POA: Diagnosis not present

## 2022-01-24 DIAGNOSIS — Z6834 Body mass index (BMI) 34.0-34.9, adult: Secondary | ICD-10-CM | POA: Diagnosis not present

## 2022-02-01 DIAGNOSIS — M5030 Other cervical disc degeneration, unspecified cervical region: Secondary | ICD-10-CM | POA: Diagnosis not present

## 2022-02-01 DIAGNOSIS — G894 Chronic pain syndrome: Secondary | ICD-10-CM | POA: Diagnosis not present

## 2022-02-01 DIAGNOSIS — M5136 Other intervertebral disc degeneration, lumbar region: Secondary | ICD-10-CM | POA: Diagnosis not present

## 2022-02-01 DIAGNOSIS — M47819 Spondylosis without myelopathy or radiculopathy, site unspecified: Secondary | ICD-10-CM | POA: Diagnosis not present

## 2022-02-01 DIAGNOSIS — M1990 Unspecified osteoarthritis, unspecified site: Secondary | ICD-10-CM | POA: Diagnosis not present

## 2022-02-08 DIAGNOSIS — M47819 Spondylosis without myelopathy or radiculopathy, site unspecified: Secondary | ICD-10-CM | POA: Diagnosis not present

## 2022-02-08 DIAGNOSIS — M1990 Unspecified osteoarthritis, unspecified site: Secondary | ICD-10-CM | POA: Diagnosis not present

## 2022-03-10 DIAGNOSIS — M5136 Other intervertebral disc degeneration, lumbar region: Secondary | ICD-10-CM | POA: Diagnosis not present

## 2022-03-22 DIAGNOSIS — Z5181 Encounter for therapeutic drug level monitoring: Secondary | ICD-10-CM | POA: Diagnosis not present

## 2022-04-04 DIAGNOSIS — M5136 Other intervertebral disc degeneration, lumbar region: Secondary | ICD-10-CM | POA: Diagnosis not present

## 2022-04-04 DIAGNOSIS — M1990 Unspecified osteoarthritis, unspecified site: Secondary | ICD-10-CM | POA: Diagnosis not present

## 2022-04-04 DIAGNOSIS — M47819 Spondylosis without myelopathy or radiculopathy, site unspecified: Secondary | ICD-10-CM | POA: Diagnosis not present

## 2022-04-04 DIAGNOSIS — M5030 Other cervical disc degeneration, unspecified cervical region: Secondary | ICD-10-CM | POA: Diagnosis not present

## 2022-04-04 DIAGNOSIS — G894 Chronic pain syndrome: Secondary | ICD-10-CM | POA: Diagnosis not present

## 2022-04-04 DIAGNOSIS — G8929 Other chronic pain: Secondary | ICD-10-CM | POA: Diagnosis not present

## 2022-04-09 DIAGNOSIS — M1990 Unspecified osteoarthritis, unspecified site: Secondary | ICD-10-CM | POA: Diagnosis not present

## 2022-04-09 DIAGNOSIS — M5136 Other intervertebral disc degeneration, lumbar region: Secondary | ICD-10-CM | POA: Diagnosis not present

## 2022-05-02 DIAGNOSIS — M47819 Spondylosis without myelopathy or radiculopathy, site unspecified: Secondary | ICD-10-CM | POA: Diagnosis not present

## 2022-05-02 DIAGNOSIS — M1990 Unspecified osteoarthritis, unspecified site: Secondary | ICD-10-CM | POA: Diagnosis not present

## 2022-05-02 DIAGNOSIS — M5030 Other cervical disc degeneration, unspecified cervical region: Secondary | ICD-10-CM | POA: Diagnosis not present

## 2022-05-02 DIAGNOSIS — G894 Chronic pain syndrome: Secondary | ICD-10-CM | POA: Diagnosis not present

## 2022-05-02 DIAGNOSIS — I10 Essential (primary) hypertension: Secondary | ICD-10-CM | POA: Diagnosis not present

## 2022-05-02 DIAGNOSIS — G8929 Other chronic pain: Secondary | ICD-10-CM | POA: Diagnosis not present

## 2022-05-02 DIAGNOSIS — Z79891 Long term (current) use of opiate analgesic: Secondary | ICD-10-CM | POA: Diagnosis not present

## 2022-05-09 DIAGNOSIS — M1990 Unspecified osteoarthritis, unspecified site: Secondary | ICD-10-CM | POA: Diagnosis not present

## 2022-05-09 DIAGNOSIS — M5136 Other intervertebral disc degeneration, lumbar region: Secondary | ICD-10-CM | POA: Diagnosis not present

## 2022-05-30 DIAGNOSIS — M5030 Other cervical disc degeneration, unspecified cervical region: Secondary | ICD-10-CM | POA: Diagnosis not present

## 2022-05-30 DIAGNOSIS — G894 Chronic pain syndrome: Secondary | ICD-10-CM | POA: Diagnosis not present

## 2022-05-30 DIAGNOSIS — M47819 Spondylosis without myelopathy or radiculopathy, site unspecified: Secondary | ICD-10-CM | POA: Diagnosis not present

## 2022-05-30 DIAGNOSIS — M1990 Unspecified osteoarthritis, unspecified site: Secondary | ICD-10-CM | POA: Diagnosis not present

## 2022-05-30 DIAGNOSIS — I10 Essential (primary) hypertension: Secondary | ICD-10-CM | POA: Diagnosis not present

## 2022-05-30 DIAGNOSIS — Z79891 Long term (current) use of opiate analgesic: Secondary | ICD-10-CM | POA: Diagnosis not present

## 2022-06-02 DIAGNOSIS — E1169 Type 2 diabetes mellitus with other specified complication: Secondary | ICD-10-CM | POA: Diagnosis not present

## 2022-06-02 DIAGNOSIS — Z79899 Other long term (current) drug therapy: Secondary | ICD-10-CM | POA: Diagnosis not present

## 2022-06-02 DIAGNOSIS — I10 Essential (primary) hypertension: Secondary | ICD-10-CM | POA: Diagnosis not present

## 2022-06-02 DIAGNOSIS — E889 Metabolic disorder, unspecified: Secondary | ICD-10-CM | POA: Diagnosis not present

## 2022-06-02 DIAGNOSIS — Z0000 Encounter for general adult medical examination without abnormal findings: Secondary | ICD-10-CM | POA: Diagnosis not present

## 2022-06-02 DIAGNOSIS — Z1331 Encounter for screening for depression: Secondary | ICD-10-CM | POA: Diagnosis not present

## 2022-06-02 DIAGNOSIS — Z6833 Body mass index (BMI) 33.0-33.9, adult: Secondary | ICD-10-CM | POA: Diagnosis not present

## 2022-06-07 DIAGNOSIS — H60391 Other infective otitis externa, right ear: Secondary | ICD-10-CM | POA: Diagnosis not present

## 2022-06-08 DIAGNOSIS — H61001 Unspecified perichondritis of right external ear: Secondary | ICD-10-CM | POA: Diagnosis not present

## 2022-06-08 DIAGNOSIS — M1990 Unspecified osteoarthritis, unspecified site: Secondary | ICD-10-CM | POA: Diagnosis not present

## 2022-06-08 DIAGNOSIS — M5136 Other intervertebral disc degeneration, lumbar region: Secondary | ICD-10-CM | POA: Diagnosis not present

## 2022-06-08 DIAGNOSIS — H61031 Chondritis of right external ear: Secondary | ICD-10-CM | POA: Diagnosis not present

## 2022-06-08 DIAGNOSIS — H6001 Abscess of right external ear: Secondary | ICD-10-CM | POA: Diagnosis not present

## 2022-06-08 DIAGNOSIS — M352 Behcet's disease: Secondary | ICD-10-CM | POA: Diagnosis not present

## 2022-06-08 DIAGNOSIS — E119 Type 2 diabetes mellitus without complications: Secondary | ICD-10-CM | POA: Diagnosis not present

## 2022-06-10 DIAGNOSIS — E119 Type 2 diabetes mellitus without complications: Secondary | ICD-10-CM | POA: Diagnosis not present

## 2022-06-10 DIAGNOSIS — H61031 Chondritis of right external ear: Secondary | ICD-10-CM | POA: Diagnosis not present

## 2022-06-10 DIAGNOSIS — H61001 Unspecified perichondritis of right external ear: Secondary | ICD-10-CM | POA: Diagnosis not present

## 2022-06-14 DIAGNOSIS — H9553 Postprocedural seroma of ear and mastoid process following a procedure on the ear and mastoid process: Secondary | ICD-10-CM | POA: Diagnosis not present

## 2022-06-14 DIAGNOSIS — H61031 Chondritis of right external ear: Secondary | ICD-10-CM | POA: Diagnosis not present

## 2022-06-27 DIAGNOSIS — G894 Chronic pain syndrome: Secondary | ICD-10-CM | POA: Diagnosis not present

## 2022-06-27 DIAGNOSIS — M47819 Spondylosis without myelopathy or radiculopathy, site unspecified: Secondary | ICD-10-CM | POA: Diagnosis not present

## 2022-06-27 DIAGNOSIS — M5136 Other intervertebral disc degeneration, lumbar region: Secondary | ICD-10-CM | POA: Diagnosis not present

## 2022-06-27 DIAGNOSIS — M5030 Other cervical disc degeneration, unspecified cervical region: Secondary | ICD-10-CM | POA: Diagnosis not present

## 2022-06-27 DIAGNOSIS — Z79891 Long term (current) use of opiate analgesic: Secondary | ICD-10-CM | POA: Diagnosis not present

## 2022-06-28 DIAGNOSIS — H61031 Chondritis of right external ear: Secondary | ICD-10-CM | POA: Diagnosis not present

## 2022-06-28 DIAGNOSIS — H9553 Postprocedural seroma of ear and mastoid process following a procedure on the ear and mastoid process: Secondary | ICD-10-CM | POA: Diagnosis not present

## 2022-07-06 DIAGNOSIS — S00431A Contusion of right ear, initial encounter: Secondary | ICD-10-CM | POA: Diagnosis not present

## 2022-07-06 DIAGNOSIS — H61031 Chondritis of right external ear: Secondary | ICD-10-CM | POA: Diagnosis not present

## 2022-07-08 DIAGNOSIS — M1990 Unspecified osteoarthritis, unspecified site: Secondary | ICD-10-CM | POA: Diagnosis not present

## 2022-07-08 DIAGNOSIS — M5136 Other intervertebral disc degeneration, lumbar region: Secondary | ICD-10-CM | POA: Diagnosis not present

## 2022-07-11 DIAGNOSIS — Z9889 Other specified postprocedural states: Secondary | ICD-10-CM | POA: Diagnosis not present

## 2022-07-19 DIAGNOSIS — H61031 Chondritis of right external ear: Secondary | ICD-10-CM | POA: Diagnosis not present

## 2022-07-22 DIAGNOSIS — H61031 Chondritis of right external ear: Secondary | ICD-10-CM | POA: Diagnosis not present

## 2022-08-04 DIAGNOSIS — H61001 Unspecified perichondritis of right external ear: Secondary | ICD-10-CM | POA: Diagnosis not present

## 2022-08-15 ENCOUNTER — Other Ambulatory Visit: Payer: Self-pay | Admitting: Physician Assistant

## 2022-08-18 DIAGNOSIS — H61031 Chondritis of right external ear: Secondary | ICD-10-CM | POA: Diagnosis not present

## 2022-08-23 ENCOUNTER — Other Ambulatory Visit: Payer: Self-pay | Admitting: Physician Assistant

## 2022-08-23 NOTE — Telephone Encounter (Signed)
Pt of Jari Favre, [place], former Dr. Katrinka Blazing pt. Not scheduled with another cardiologist yet. Please review for refill. Thank you!

## 2022-08-29 DIAGNOSIS — M5030 Other cervical disc degeneration, unspecified cervical region: Secondary | ICD-10-CM | POA: Diagnosis not present

## 2022-08-29 DIAGNOSIS — G894 Chronic pain syndrome: Secondary | ICD-10-CM | POA: Diagnosis not present

## 2022-08-29 DIAGNOSIS — M62830 Muscle spasm of back: Secondary | ICD-10-CM | POA: Diagnosis not present

## 2022-08-29 DIAGNOSIS — M5136 Other intervertebral disc degeneration, lumbar region: Secondary | ICD-10-CM | POA: Diagnosis not present

## 2022-08-29 DIAGNOSIS — M47819 Spondylosis without myelopathy or radiculopathy, site unspecified: Secondary | ICD-10-CM | POA: Diagnosis not present

## 2022-09-13 DIAGNOSIS — K219 Gastro-esophageal reflux disease without esophagitis: Secondary | ICD-10-CM | POA: Diagnosis not present

## 2022-09-13 DIAGNOSIS — L0390 Cellulitis, unspecified: Secondary | ICD-10-CM | POA: Diagnosis not present

## 2022-09-13 DIAGNOSIS — J40 Bronchitis, not specified as acute or chronic: Secondary | ICD-10-CM | POA: Diagnosis not present

## 2022-09-13 DIAGNOSIS — J329 Chronic sinusitis, unspecified: Secondary | ICD-10-CM | POA: Diagnosis not present

## 2022-09-20 ENCOUNTER — Other Ambulatory Visit: Payer: Self-pay | Admitting: Physician Assistant

## 2022-09-20 NOTE — Telephone Encounter (Signed)
Former pt of Dr. Katrinka Blazing. Has been seeing Jari Favre, [place]. Pt is not currently scheduled with another provider. Please review for refill. Thank you!

## 2022-09-26 DIAGNOSIS — M47819 Spondylosis without myelopathy or radiculopathy, site unspecified: Secondary | ICD-10-CM | POA: Diagnosis not present

## 2022-09-26 DIAGNOSIS — Z5181 Encounter for therapeutic drug level monitoring: Secondary | ICD-10-CM | POA: Diagnosis not present

## 2022-09-26 DIAGNOSIS — M5136 Other intervertebral disc degeneration, lumbar region: Secondary | ICD-10-CM | POA: Diagnosis not present

## 2022-09-26 DIAGNOSIS — M62830 Muscle spasm of back: Secondary | ICD-10-CM | POA: Diagnosis not present

## 2022-09-26 DIAGNOSIS — Z79891 Long term (current) use of opiate analgesic: Secondary | ICD-10-CM | POA: Diagnosis not present

## 2022-09-26 DIAGNOSIS — M5030 Other cervical disc degeneration, unspecified cervical region: Secondary | ICD-10-CM | POA: Diagnosis not present

## 2022-09-26 DIAGNOSIS — G894 Chronic pain syndrome: Secondary | ICD-10-CM | POA: Diagnosis not present

## 2022-10-08 ENCOUNTER — Other Ambulatory Visit: Payer: Self-pay | Admitting: Physician Assistant

## 2022-10-24 DIAGNOSIS — M5136 Other intervertebral disc degeneration, lumbar region: Secondary | ICD-10-CM | POA: Diagnosis not present

## 2022-10-24 DIAGNOSIS — M47819 Spondylosis without myelopathy or radiculopathy, site unspecified: Secondary | ICD-10-CM | POA: Diagnosis not present

## 2022-10-24 DIAGNOSIS — G894 Chronic pain syndrome: Secondary | ICD-10-CM | POA: Diagnosis not present

## 2022-10-24 DIAGNOSIS — Z79891 Long term (current) use of opiate analgesic: Secondary | ICD-10-CM | POA: Diagnosis not present

## 2022-10-24 DIAGNOSIS — M62830 Muscle spasm of back: Secondary | ICD-10-CM | POA: Diagnosis not present

## 2022-10-24 DIAGNOSIS — M5030 Other cervical disc degeneration, unspecified cervical region: Secondary | ICD-10-CM | POA: Diagnosis not present

## 2022-11-21 ENCOUNTER — Other Ambulatory Visit: Payer: Self-pay | Admitting: Physician Assistant

## 2022-11-29 DIAGNOSIS — G894 Chronic pain syndrome: Secondary | ICD-10-CM | POA: Diagnosis not present

## 2022-11-29 DIAGNOSIS — Z79891 Long term (current) use of opiate analgesic: Secondary | ICD-10-CM | POA: Diagnosis not present

## 2022-11-29 DIAGNOSIS — Z5181 Encounter for therapeutic drug level monitoring: Secondary | ICD-10-CM | POA: Diagnosis not present

## 2022-11-29 DIAGNOSIS — M542 Cervicalgia: Secondary | ICD-10-CM | POA: Diagnosis not present

## 2022-11-29 DIAGNOSIS — M5459 Other low back pain: Secondary | ICD-10-CM | POA: Diagnosis not present

## 2022-12-05 DIAGNOSIS — M47819 Spondylosis without myelopathy or radiculopathy, site unspecified: Secondary | ICD-10-CM | POA: Diagnosis not present

## 2022-12-05 DIAGNOSIS — M5136 Other intervertebral disc degeneration, lumbar region: Secondary | ICD-10-CM | POA: Diagnosis not present

## 2022-12-08 DIAGNOSIS — M542 Cervicalgia: Secondary | ICD-10-CM | POA: Diagnosis not present

## 2022-12-08 DIAGNOSIS — M5459 Other low back pain: Secondary | ICD-10-CM | POA: Diagnosis not present

## 2022-12-08 DIAGNOSIS — G894 Chronic pain syndrome: Secondary | ICD-10-CM | POA: Diagnosis not present

## 2022-12-08 DIAGNOSIS — Z79891 Long term (current) use of opiate analgesic: Secondary | ICD-10-CM | POA: Diagnosis not present

## 2022-12-08 DIAGNOSIS — Z5181 Encounter for therapeutic drug level monitoring: Secondary | ICD-10-CM | POA: Diagnosis not present

## 2022-12-09 DIAGNOSIS — G894 Chronic pain syndrome: Secondary | ICD-10-CM | POA: Diagnosis not present

## 2022-12-09 DIAGNOSIS — Z5181 Encounter for therapeutic drug level monitoring: Secondary | ICD-10-CM | POA: Diagnosis not present

## 2022-12-09 DIAGNOSIS — Z79891 Long term (current) use of opiate analgesic: Secondary | ICD-10-CM | POA: Diagnosis not present

## 2022-12-09 DIAGNOSIS — M5459 Other low back pain: Secondary | ICD-10-CM | POA: Diagnosis not present

## 2022-12-19 ENCOUNTER — Other Ambulatory Visit: Payer: Self-pay | Admitting: Physician Assistant

## 2022-12-20 NOTE — Telephone Encounter (Signed)
Former pt of Dr. Katrinka Blazing. Pt was last seen by Jari Favre, PA on 11/30/21. Unsure who the patient is to follow-up with and they are not scheduled for an appointment.

## 2023-01-04 ENCOUNTER — Other Ambulatory Visit: Payer: Self-pay | Admitting: Physician Assistant

## 2023-01-04 DIAGNOSIS — M5136 Other intervertebral disc degeneration, lumbar region: Secondary | ICD-10-CM | POA: Diagnosis not present

## 2023-01-04 DIAGNOSIS — M47819 Spondylosis without myelopathy or radiculopathy, site unspecified: Secondary | ICD-10-CM | POA: Diagnosis not present

## 2023-01-05 DIAGNOSIS — Z5181 Encounter for therapeutic drug level monitoring: Secondary | ICD-10-CM | POA: Diagnosis not present

## 2023-01-05 DIAGNOSIS — M5459 Other low back pain: Secondary | ICD-10-CM | POA: Diagnosis not present

## 2023-01-05 DIAGNOSIS — G894 Chronic pain syndrome: Secondary | ICD-10-CM | POA: Diagnosis not present

## 2023-01-05 DIAGNOSIS — Z79891 Long term (current) use of opiate analgesic: Secondary | ICD-10-CM | POA: Diagnosis not present

## 2023-01-05 DIAGNOSIS — M542 Cervicalgia: Secondary | ICD-10-CM | POA: Diagnosis not present

## 2023-01-10 DIAGNOSIS — L03116 Cellulitis of left lower limb: Secondary | ICD-10-CM | POA: Diagnosis not present

## 2023-01-10 DIAGNOSIS — L03115 Cellulitis of right lower limb: Secondary | ICD-10-CM | POA: Diagnosis not present

## 2023-01-10 DIAGNOSIS — Z6835 Body mass index (BMI) 35.0-35.9, adult: Secondary | ICD-10-CM | POA: Diagnosis not present

## 2023-01-10 DIAGNOSIS — R509 Fever, unspecified: Secondary | ICD-10-CM | POA: Diagnosis not present

## 2023-02-02 DIAGNOSIS — G894 Chronic pain syndrome: Secondary | ICD-10-CM | POA: Diagnosis not present

## 2023-02-02 DIAGNOSIS — M5459 Other low back pain: Secondary | ICD-10-CM | POA: Diagnosis not present

## 2023-02-02 DIAGNOSIS — Z79891 Long term (current) use of opiate analgesic: Secondary | ICD-10-CM | POA: Diagnosis not present

## 2023-02-02 DIAGNOSIS — M542 Cervicalgia: Secondary | ICD-10-CM | POA: Diagnosis not present

## 2023-02-02 DIAGNOSIS — M961 Postlaminectomy syndrome, not elsewhere classified: Secondary | ICD-10-CM | POA: Diagnosis not present

## 2023-02-02 DIAGNOSIS — Z5181 Encounter for therapeutic drug level monitoring: Secondary | ICD-10-CM | POA: Diagnosis not present

## 2023-02-03 DIAGNOSIS — M47819 Spondylosis without myelopathy or radiculopathy, site unspecified: Secondary | ICD-10-CM | POA: Diagnosis not present

## 2023-02-04 ENCOUNTER — Other Ambulatory Visit: Payer: Self-pay | Admitting: Physician Assistant

## 2023-02-09 DIAGNOSIS — G894 Chronic pain syndrome: Secondary | ICD-10-CM | POA: Diagnosis not present

## 2023-02-09 DIAGNOSIS — Z5181 Encounter for therapeutic drug level monitoring: Secondary | ICD-10-CM | POA: Diagnosis not present

## 2023-02-09 DIAGNOSIS — M5459 Other low back pain: Secondary | ICD-10-CM | POA: Diagnosis not present

## 2023-02-09 DIAGNOSIS — Z79891 Long term (current) use of opiate analgesic: Secondary | ICD-10-CM | POA: Diagnosis not present

## 2023-02-17 ENCOUNTER — Other Ambulatory Visit: Payer: Self-pay | Admitting: Physician Assistant

## 2023-03-01 DIAGNOSIS — M47816 Spondylosis without myelopathy or radiculopathy, lumbar region: Secondary | ICD-10-CM | POA: Diagnosis not present

## 2023-03-01 DIAGNOSIS — M4316 Spondylolisthesis, lumbar region: Secondary | ICD-10-CM | POA: Diagnosis not present

## 2023-03-01 DIAGNOSIS — M5136 Other intervertebral disc degeneration, lumbar region with discogenic back pain only: Secondary | ICD-10-CM | POA: Diagnosis not present

## 2023-03-02 DIAGNOSIS — G894 Chronic pain syndrome: Secondary | ICD-10-CM | POA: Diagnosis not present

## 2023-03-02 DIAGNOSIS — Z79891 Long term (current) use of opiate analgesic: Secondary | ICD-10-CM | POA: Diagnosis not present

## 2023-03-02 DIAGNOSIS — F5101 Primary insomnia: Secondary | ICD-10-CM | POA: Diagnosis not present

## 2023-03-02 DIAGNOSIS — F3289 Other specified depressive episodes: Secondary | ICD-10-CM | POA: Diagnosis not present

## 2023-03-02 DIAGNOSIS — M542 Cervicalgia: Secondary | ICD-10-CM | POA: Diagnosis not present

## 2023-03-02 DIAGNOSIS — F4542 Pain disorder with related psychological factors: Secondary | ICD-10-CM | POA: Diagnosis not present

## 2023-03-02 DIAGNOSIS — Z5181 Encounter for therapeutic drug level monitoring: Secondary | ICD-10-CM | POA: Diagnosis not present

## 2023-03-02 DIAGNOSIS — M5459 Other low back pain: Secondary | ICD-10-CM | POA: Diagnosis not present

## 2023-03-18 ENCOUNTER — Other Ambulatory Visit: Payer: Self-pay | Admitting: Physician Assistant

## 2023-03-30 DIAGNOSIS — F5101 Primary insomnia: Secondary | ICD-10-CM | POA: Diagnosis not present

## 2023-03-30 DIAGNOSIS — F4542 Pain disorder with related psychological factors: Secondary | ICD-10-CM | POA: Diagnosis not present

## 2023-03-30 DIAGNOSIS — F3289 Other specified depressive episodes: Secondary | ICD-10-CM | POA: Diagnosis not present

## 2023-03-30 DIAGNOSIS — Z79891 Long term (current) use of opiate analgesic: Secondary | ICD-10-CM | POA: Diagnosis not present

## 2023-03-30 DIAGNOSIS — M51362 Other intervertebral disc degeneration, lumbar region with discogenic back pain and lower extremity pain: Secondary | ICD-10-CM | POA: Diagnosis not present

## 2023-03-30 DIAGNOSIS — Z5181 Encounter for therapeutic drug level monitoring: Secondary | ICD-10-CM | POA: Diagnosis not present

## 2023-03-30 DIAGNOSIS — M542 Cervicalgia: Secondary | ICD-10-CM | POA: Diagnosis not present

## 2023-03-30 DIAGNOSIS — G894 Chronic pain syndrome: Secondary | ICD-10-CM | POA: Diagnosis not present

## 2023-03-30 DIAGNOSIS — M5459 Other low back pain: Secondary | ICD-10-CM | POA: Diagnosis not present

## 2023-04-20 ENCOUNTER — Other Ambulatory Visit: Payer: Self-pay

## 2023-04-20 MED ORDER — CLOPIDOGREL BISULFATE 75 MG PO TABS: 75.0000 mg | ORAL_TABLET | Freq: Every day | ORAL | 0 refills | Status: DC

## 2023-04-20 MED ORDER — CARVEDILOL 25 MG PO TABS: ORAL_TABLET | ORAL | 0 refills | Status: DC

## 2023-04-20 MED ORDER — ISOSORBIDE MONONITRATE ER 60 MG PO TB24: 120.0000 mg | ORAL_TABLET | Freq: Every day | ORAL | 0 refills | Status: DC

## 2023-04-20 NOTE — Addendum Note (Signed)
 Addended by: Margaret Pyle D on: 04/20/2023 12:03 PM   Modules accepted: Orders

## 2023-04-25 ENCOUNTER — Other Ambulatory Visit: Payer: Self-pay | Admitting: Physician Assistant

## 2023-04-27 DIAGNOSIS — M5459 Other low back pain: Secondary | ICD-10-CM | POA: Diagnosis not present

## 2023-04-27 DIAGNOSIS — M5451 Vertebrogenic low back pain: Secondary | ICD-10-CM | POA: Diagnosis not present

## 2023-04-27 DIAGNOSIS — Z79891 Long term (current) use of opiate analgesic: Secondary | ICD-10-CM | POA: Diagnosis not present

## 2023-04-27 DIAGNOSIS — G894 Chronic pain syndrome: Secondary | ICD-10-CM | POA: Diagnosis not present

## 2023-04-27 DIAGNOSIS — M51362 Other intervertebral disc degeneration, lumbar region with discogenic back pain and lower extremity pain: Secondary | ICD-10-CM | POA: Diagnosis not present

## 2023-04-27 DIAGNOSIS — Z5181 Encounter for therapeutic drug level monitoring: Secondary | ICD-10-CM | POA: Diagnosis not present

## 2023-04-27 DIAGNOSIS — M542 Cervicalgia: Secondary | ICD-10-CM | POA: Diagnosis not present

## 2023-05-19 DIAGNOSIS — L089 Local infection of the skin and subcutaneous tissue, unspecified: Secondary | ICD-10-CM | POA: Diagnosis not present

## 2023-05-19 DIAGNOSIS — M791 Myalgia, unspecified site: Secondary | ICD-10-CM | POA: Diagnosis not present

## 2023-05-24 DIAGNOSIS — L089 Local infection of the skin and subcutaneous tissue, unspecified: Secondary | ICD-10-CM | POA: Diagnosis not present

## 2023-05-25 DIAGNOSIS — F5101 Primary insomnia: Secondary | ICD-10-CM | POA: Diagnosis not present

## 2023-05-25 DIAGNOSIS — G894 Chronic pain syndrome: Secondary | ICD-10-CM | POA: Diagnosis not present

## 2023-05-25 DIAGNOSIS — F4542 Pain disorder with related psychological factors: Secondary | ICD-10-CM | POA: Diagnosis not present

## 2023-05-25 DIAGNOSIS — M5459 Other low back pain: Secondary | ICD-10-CM | POA: Diagnosis not present

## 2023-05-25 DIAGNOSIS — Z79891 Long term (current) use of opiate analgesic: Secondary | ICD-10-CM | POA: Diagnosis not present

## 2023-05-25 DIAGNOSIS — M5412 Radiculopathy, cervical region: Secondary | ICD-10-CM | POA: Diagnosis not present

## 2023-05-25 DIAGNOSIS — Z5181 Encounter for therapeutic drug level monitoring: Secondary | ICD-10-CM | POA: Diagnosis not present

## 2023-05-25 DIAGNOSIS — F3289 Other specified depressive episodes: Secondary | ICD-10-CM | POA: Diagnosis not present

## 2023-05-25 DIAGNOSIS — M542 Cervicalgia: Secondary | ICD-10-CM | POA: Diagnosis not present

## 2023-05-26 ENCOUNTER — Telehealth: Payer: Self-pay | Admitting: Cardiology

## 2023-05-26 MED ORDER — CARVEDILOL 25 MG PO TABS: ORAL_TABLET | ORAL | 0 refills | Status: DC

## 2023-05-26 MED ORDER — ISOSORBIDE MONONITRATE ER 60 MG PO TB24: 120.0000 mg | ORAL_TABLET | Freq: Every day | ORAL | 0 refills | Status: DC

## 2023-05-26 NOTE — Telephone Encounter (Signed)
*  STAT* If patient is at the pharmacy, call can be transferred to refill team.   1. Which medications need to be refilled? (please list name of each medication and dose if known) isosorbide  mononitrate (IMDUR ) 60 MG 24 hr tablet   carvedilol  (COREG ) 25 MG tablet   2. Which pharmacy/location (including street and city if local pharmacy) is medication to be sent to? [facility] - [place], [place] - [address]   3. Do they need a 30 day or 90 day supply? 90  Patient has appt on 05/29/23

## 2023-05-28 ENCOUNTER — Other Ambulatory Visit: Payer: Self-pay | Admitting: Cardiology

## 2023-05-29 ENCOUNTER — Encounter: Payer: Self-pay | Admitting: Cardiology

## 2023-05-29 ENCOUNTER — Ambulatory Visit: Payer: Medicare Other | Attending: Cardiology | Admitting: Cardiology

## 2023-05-29 VITALS — BP 124/80 | HR 84 | Resp 16 | Ht 68.0 in | Wt 246.4 lb

## 2023-05-29 DIAGNOSIS — E1169 Type 2 diabetes mellitus with other specified complication: Secondary | ICD-10-CM | POA: Diagnosis not present

## 2023-05-29 DIAGNOSIS — E669 Obesity, unspecified: Secondary | ICD-10-CM | POA: Diagnosis not present

## 2023-05-29 DIAGNOSIS — I25118 Atherosclerotic heart disease of native coronary artery with other forms of angina pectoris: Secondary | ICD-10-CM | POA: Insufficient documentation

## 2023-05-29 DIAGNOSIS — E782 Mixed hyperlipidemia: Secondary | ICD-10-CM | POA: Diagnosis not present

## 2023-05-29 DIAGNOSIS — E785 Hyperlipidemia, unspecified: Secondary | ICD-10-CM | POA: Diagnosis not present

## 2023-05-29 DIAGNOSIS — I10 Essential (primary) hypertension: Secondary | ICD-10-CM | POA: Diagnosis not present

## 2023-05-29 NOTE — Progress Notes (Signed)
 Cardiology Office Note:  .   Date:  05/29/2023  ID:  Brittney Banks, DOB [birth year], MRN [id number] PCP: Gaither Juba, MD  [facility] Providers Cardiologist:  Fransico Ivy, MD PCP: Gaither Juba, MD  Chief Complaint  Patient presents with   Coronary Artery Disease      History of Present Illness: .    Brittney Banks is a 53 y.o. female with hypertension, hyperlipidemia, type 2 diabetes mellitus, CAD  Patient has known CTO RCA with ISR, not felt to be surgical candidate, unsuccessful CTO PCI attempt in 2019.   Patient continues to have episodes of chest pain.  Pain occurs almost on a daily basis on exertion, resolving with rest and nitroglycerin , but also occurs at times at rest.  She is currently on carvedilol  25 mg twice daily, and Imdur  120 mg daily.  Symptoms have remained fairly unchanged for last couple of years.  She has not had any functional testing recently.  Diabetes remains uncontrolled, but A1c improved to 7.5% in 03/2023, according to the patient.  LDL remains uncontrolled.  Vitals:   05/29/23 1331  BP: 124/80  Pulse: 84  Resp: 16  SpO2: 95%     ROS:  Review of Systems  Cardiovascular:  Positive for chest pain. Negative for dyspnea on exertion, leg swelling, palpitations and syncope.     Studies Reviewed: Brittney Banks        EKG 05/29/2023: Normal sinus rhythm  Inferior infarct, age indeterminate When compared with ECG of 05-Oct-2017 16:43, No significant change was found    Independently interpreted 05/2022: Chol 156, TG 169, HDL 35, LDL 117 HbA1C 8.5% Hb 15.2 Cr 0.6   Physical Exam:   Physical Exam Vitals and nursing note reviewed.  Constitutional:      General: She is not in acute distress. Neck:     Vascular: No JVD.  Cardiovascular:     Rate and Rhythm: Normal rate and regular rhythm.     Heart sounds: Normal heart sounds. No murmur heard. Pulmonary:     Effort: Pulmonary effort is normal.     Breath sounds: Normal breath sounds.  No wheezing or rales.  Musculoskeletal:     Right lower leg: No edema.     Left lower leg: No edema.      VISIT DIAGNOSES:   ICD-10-CM   1. Coronary artery disease of native artery of native heart with stable angina pectoris (HCC)  I25.118 EKG 12-Lead    Lipid panel    ECHOCARDIOGRAM COMPLETE    NM PET CT CARDIAC PERFUSION MULTI W/ABSOLUTE BLOODFLOW    Cardiac Stress Test: Informed Consent Details: Physician/Practitioner Attestation; Transcribe to consent form and obtain patient signature    2. Hyperlipidemia LDL goal <70  E78.5 Lipid panel       ASSESSMENT AND PLAN: .    Brittney Banks is a 53 y.o. female with  hypertension, hyperlipidemia, type 2 diabetes mellitus, CAD  CAD: Mostly stable angina symptoms, although occasionally has episodes of nitrate responsive chest pain at rest as well. Stress test in 2021 showed RCA territory infarct, without any ischemia. However, given her persistent anginal symptoms, I will obtain echocardiogram and PET/CT stress test. Continue Plavix . Continue Crestor  40 mg daily.  Check lipid panel, she will very likely need injectable agents, in addition to statin.   Continue Coreg  25 mg twice daily, Imdur  120 mg daily. If there is clear evidence of ischemia, I would add Ranexa  and repeat coronary angiogram. In the  past, she was felt not to be a surgical candidate given poor LAD targets, had unsuccessful attempt at PCI to RCA CTO.  Hypertension: Controlled  Mixed hyperlipidemia: See above  Type II DM: Uncontrolled, needs aggressive management. Will defer to PCP.   Informed Consent   Shared Decision Making/Informed Consent The risks [chest pain, shortness of breath, cardiac arrhythmias, dizziness, blood pressure fluctuations, myocardial infarction, stroke/transient ischemic attack, nausea, vomiting, allergic reaction, radiation exposure, metallic taste sensation and life-threatening complications (estimated to be 1 in 10,000)], benefits (risk  stratification, diagnosing coronary artery disease, treatment guidance) and alternatives of a cardiac PET stress test were discussed in detail with Ms. Zick and she agrees to proceed.          F/u in 3 months  Signed, Cody Das, MD

## 2023-05-29 NOTE — Patient Instructions (Addendum)
 Lab Work: Lipid Panel   If you have labs (blood work) drawn today and your tests are completely normal, you will receive your results only by: MyChart Message (if you have MyChart) OR A paper copy in the mail If you have any lab test that is abnormal or we need to change your treatment, we will call you to review the results.   Testing/Procedures: Echo  Your physician has requested that you have an echocardiogram. Echocardiography is a painless test that uses sound waves to create images of your heart. It provides your doctor with information about the size and shape of your heart and how well your heart's chambers and valves are working. This procedure takes approximately one hour. There are no restrictions for this procedure. Please do NOT wear cologne, perfume, aftershave, or lotions (deodorant is allowed). Please arrive 15 minutes prior to your appointment time.  Please note: We ask at that you not bring children with you during ultrasound (echo/ vascular) testing. Due to room size and safety concerns, children are not allowed in the ultrasound rooms during exams. Our front office staff cannot provide observation of children in our lobby area while testing is being conducted. An adult accompanying a patient to their appointment will only be allowed in the ultrasound room at the discretion of the ultrasound technician under special circumstances. We apologize for any inconvenience.  How to Prepare for Your Cardiac PET/CT Stress Test:  1. Please do not take these medications before your test:  ~Medications that may interfere with the cardiac pharmacological stress agent (ex. nitrates - including erectile dysfunction medications, isosorbide  mononitrate, tamulosin or beta-blockers) the day of the exam. Hold Carvedilol , Imdur , and Nitroglycerin  day of exam  ~Your remaining medications may be taken with water.  2. Nothing to eat or drink, except water, 3 hours prior to arrival time.   ~ NO  caffeine/decaffeinated products, or chocolate 12 hours prior to arrival.  3. NO perfume, cologne or lotion on chest or abdomen area.         - FEMALES - Please avoid wearing dresses to this appointment.  4. Total time is 1 to 2 hours; you may want to bring reading material for the waiting time.  Please report to Radiology at the [facility] Main Entrance 30 minutes early for your test. [address] [place], [place] [address]  Diabetic Preparation: - Hold oral medications (Metformin ) - You may take NPH and Lantus  insulin . - Do not take Humalog or Humulin R  (Regular Insulin ) the day of your test. - Check blood sugars prior to leaving the house. - If able to eat breakfast prior to 3 hour fasting, you may take all medications, including your insulin , - Do not worry if you miss your breakfast dose of insulin  - start at your next meal. - Patients who wear a continuous glucose monitor MUST remove the device prior to scanning.  IF YOU THINK YOU MAY BE PREGNANT, OR ARE NURSING PLEASE INFORM THE TECHNOLOGIST.  In preparation for your appointment, medication and supplies will be purchased.  Appointment availability is limited, so if you need to cancel or reschedule, please call the Radiology Department at [phone] Maryan Smalling) OR [phone] [facility])  24 hours in advance to avoid a cancellation fee of $100.00  What to Expect After you Arrive:  Once you arrive and check in for your appointment, you will be taken to a preparation room within the Radiology Department.  A technologist or Nurse will obtain your medical history,  verify that you are correctly prepped for the exam, and explain the procedure.  Afterwards,  an IV will be started in your arm and electrodes will be placed on your skin for EKG monitoring during the stress portion of the exam. Then you will be escorted to the PET/CT scanner.  There, staff will get you positioned on the scanner and obtain a blood pressure and EKG.   During the exam, you will continue to be connected to the EKG and blood pressure machines.  A small, safe amount of a radioactive tracer will be injected in your IV to obtain a series of pictures of your heart along with an injection of a stress agent.    After your Exam:  It is recommended that you eat a meal and drink a caffeinated beverage to counter act any effects of the stress agent.  Drink plenty of fluids for the remainder of the day and urinate frequently for the first couple of hours after the exam.  Your doctor will inform you of your test results within 7-10 business days.  For more information and frequently asked questions, please visit our website : [email or web address]  For questions about your test or how to prepare for your test, please call: Cardiac Imaging Nurse Navigators Office: [phone]      Follow-Up: At [facility], you and your health needs are our priority.  As part of our continuing mission to provide you with exceptional heart care, we have created designated Provider Care Teams.  These Care Teams include your primary Cardiologist (physician) and Advanced Practice Providers (APPs -  Physician Assistants and Nurse Practitioners) who all work together to provide you with the care you need, when you need it.  We recommend signing up for the patient portal called "MyChart".  Sign up information is provided on this After Visit Summary.  MyChart is used to connect with patients for Virtual Visits (Telemedicine).  Patients are able to view lab/test results, encounter notes, upcoming appointments, etc.  Non-urgent messages can be sent to your provider as well.   To learn more about what you can do with MyChart, go to [email or web address].    Your next appointment:   3 month(s)  Provider:   Cody Das, MD     Other Instructions    1st Floor: - Lobby - Registration  - Pharmacy  - Lab - Cafe  2nd Floor: - PV Lab -  Diagnostic Testing (echo, CT, nuclear med)  3rd Floor: - Vacant  4th Floor: - TCTS (cardiothoracic surgery) - AFib Clinic - Structural Heart Clinic - Vascular Surgery  - Vascular Ultrasound  5th Floor: - [facility] (general and EP) - Clinical Pharmacy for coumadin, hypertension, lipid, weight-loss medications, and med management appointments    Valet parking services will be available as well.

## 2023-05-30 LAB — LIPID PANEL
Chol/HDL Ratio: 3.4 {ratio} (ref 0.0–4.4)
Cholesterol, Total: 127 mg/dL (ref 100–199)
HDL: 37 mg/dL — ABNORMAL LOW (ref >39)
LDL Chol Calc (NIH): 67 mg/dL (ref 0–99)
Triglycerides: 132 mg/dL (ref 0–149)
VLDL Cholesterol Cal: 23 mg/dL (ref 5–40)

## 2023-05-30 NOTE — Progress Notes (Signed)
Cholesterol looks much better than before. Continue current medications.  Thanks MJP

## 2023-06-10 ENCOUNTER — Other Ambulatory Visit: Payer: Self-pay | Admitting: Cardiology

## 2023-06-16 ENCOUNTER — Ambulatory Visit (HOSPITAL_COMMUNITY): Payer: Medicare Other | Attending: Cardiology

## 2023-06-16 DIAGNOSIS — I25118 Atherosclerotic heart disease of native coronary artery with other forms of angina pectoris: Secondary | ICD-10-CM | POA: Insufficient documentation

## 2023-06-16 LAB — ECHOCARDIOGRAM COMPLETE
Area-P 1/2: 5.23 cm2
S' Lateral: 3.3 cm

## 2023-06-19 DIAGNOSIS — Z79891 Long term (current) use of opiate analgesic: Secondary | ICD-10-CM | POA: Diagnosis not present

## 2023-06-19 DIAGNOSIS — G894 Chronic pain syndrome: Secondary | ICD-10-CM | POA: Diagnosis not present

## 2023-06-19 DIAGNOSIS — Z5181 Encounter for therapeutic drug level monitoring: Secondary | ICD-10-CM | POA: Diagnosis not present

## 2023-06-19 DIAGNOSIS — M5459 Other low back pain: Secondary | ICD-10-CM | POA: Diagnosis not present

## 2023-06-22 DIAGNOSIS — F4542 Pain disorder with related psychological factors: Secondary | ICD-10-CM | POA: Diagnosis not present

## 2023-06-22 DIAGNOSIS — F5101 Primary insomnia: Secondary | ICD-10-CM | POA: Diagnosis not present

## 2023-06-22 DIAGNOSIS — M5459 Other low back pain: Secondary | ICD-10-CM | POA: Diagnosis not present

## 2023-06-22 DIAGNOSIS — Z79891 Long term (current) use of opiate analgesic: Secondary | ICD-10-CM | POA: Diagnosis not present

## 2023-06-22 DIAGNOSIS — Z5181 Encounter for therapeutic drug level monitoring: Secondary | ICD-10-CM | POA: Diagnosis not present

## 2023-06-22 DIAGNOSIS — F3289 Other specified depressive episodes: Secondary | ICD-10-CM | POA: Diagnosis not present

## 2023-06-22 DIAGNOSIS — G894 Chronic pain syndrome: Secondary | ICD-10-CM | POA: Diagnosis not present

## 2023-06-22 DIAGNOSIS — F418 Other specified anxiety disorders: Secondary | ICD-10-CM | POA: Diagnosis not present

## 2023-06-22 DIAGNOSIS — M542 Cervicalgia: Secondary | ICD-10-CM | POA: Diagnosis not present

## 2023-06-28 DIAGNOSIS — M1990 Unspecified osteoarthritis, unspecified site: Secondary | ICD-10-CM | POA: Diagnosis not present

## 2023-06-28 DIAGNOSIS — L03115 Cellulitis of right lower limb: Secondary | ICD-10-CM | POA: Diagnosis not present

## 2023-06-28 DIAGNOSIS — L03116 Cellulitis of left lower limb: Secondary | ICD-10-CM | POA: Diagnosis not present

## 2023-06-28 DIAGNOSIS — L089 Local infection of the skin and subcutaneous tissue, unspecified: Secondary | ICD-10-CM | POA: Diagnosis not present

## 2023-07-05 DIAGNOSIS — I10 Essential (primary) hypertension: Secondary | ICD-10-CM | POA: Diagnosis not present

## 2023-07-05 DIAGNOSIS — L03116 Cellulitis of left lower limb: Secondary | ICD-10-CM | POA: Diagnosis not present

## 2023-07-05 DIAGNOSIS — Z9089 Acquired absence of other organs: Secondary | ICD-10-CM | POA: Diagnosis not present

## 2023-07-05 DIAGNOSIS — G894 Chronic pain syndrome: Secondary | ICD-10-CM | POA: Diagnosis not present

## 2023-07-05 DIAGNOSIS — Z79891 Long term (current) use of opiate analgesic: Secondary | ICD-10-CM | POA: Diagnosis not present

## 2023-07-05 DIAGNOSIS — Z87891 Personal history of nicotine dependence: Secondary | ICD-10-CM | POA: Diagnosis not present

## 2023-07-05 DIAGNOSIS — L089 Local infection of the skin and subcutaneous tissue, unspecified: Secondary | ICD-10-CM | POA: Diagnosis not present

## 2023-07-05 DIAGNOSIS — M1990 Unspecified osteoarthritis, unspecified site: Secondary | ICD-10-CM | POA: Diagnosis not present

## 2023-07-05 DIAGNOSIS — Z556 Problems related to health literacy: Secondary | ICD-10-CM | POA: Diagnosis not present

## 2023-07-05 DIAGNOSIS — Z7902 Long term (current) use of antithrombotics/antiplatelets: Secondary | ICD-10-CM | POA: Diagnosis not present

## 2023-07-05 DIAGNOSIS — E282 Polycystic ovarian syndrome: Secondary | ICD-10-CM | POA: Diagnosis not present

## 2023-07-05 DIAGNOSIS — K219 Gastro-esophageal reflux disease without esophagitis: Secondary | ICD-10-CM | POA: Diagnosis not present

## 2023-07-05 DIAGNOSIS — Z7984 Long term (current) use of oral hypoglycemic drugs: Secondary | ICD-10-CM | POA: Diagnosis not present

## 2023-07-05 DIAGNOSIS — L03115 Cellulitis of right lower limb: Secondary | ICD-10-CM | POA: Diagnosis not present

## 2023-07-11 DIAGNOSIS — L03116 Cellulitis of left lower limb: Secondary | ICD-10-CM | POA: Diagnosis not present

## 2023-07-11 DIAGNOSIS — G894 Chronic pain syndrome: Secondary | ICD-10-CM | POA: Diagnosis not present

## 2023-07-11 DIAGNOSIS — L03115 Cellulitis of right lower limb: Secondary | ICD-10-CM | POA: Diagnosis not present

## 2023-07-11 DIAGNOSIS — I10 Essential (primary) hypertension: Secondary | ICD-10-CM | POA: Diagnosis not present

## 2023-07-11 DIAGNOSIS — K219 Gastro-esophageal reflux disease without esophagitis: Secondary | ICD-10-CM | POA: Diagnosis not present

## 2023-07-11 DIAGNOSIS — L089 Local infection of the skin and subcutaneous tissue, unspecified: Secondary | ICD-10-CM | POA: Diagnosis not present

## 2023-07-13 DIAGNOSIS — K219 Gastro-esophageal reflux disease without esophagitis: Secondary | ICD-10-CM | POA: Diagnosis not present

## 2023-07-13 DIAGNOSIS — I10 Essential (primary) hypertension: Secondary | ICD-10-CM | POA: Diagnosis not present

## 2023-07-13 DIAGNOSIS — L089 Local infection of the skin and subcutaneous tissue, unspecified: Secondary | ICD-10-CM | POA: Diagnosis not present

## 2023-07-13 DIAGNOSIS — G894 Chronic pain syndrome: Secondary | ICD-10-CM | POA: Diagnosis not present

## 2023-07-13 DIAGNOSIS — L03116 Cellulitis of left lower limb: Secondary | ICD-10-CM | POA: Diagnosis not present

## 2023-07-13 DIAGNOSIS — L03115 Cellulitis of right lower limb: Secondary | ICD-10-CM | POA: Diagnosis not present

## 2023-07-17 DIAGNOSIS — L03116 Cellulitis of left lower limb: Secondary | ICD-10-CM | POA: Diagnosis not present

## 2023-07-17 DIAGNOSIS — L03115 Cellulitis of right lower limb: Secondary | ICD-10-CM | POA: Diagnosis not present

## 2023-07-17 DIAGNOSIS — L089 Local infection of the skin and subcutaneous tissue, unspecified: Secondary | ICD-10-CM | POA: Diagnosis not present

## 2023-07-19 DIAGNOSIS — I10 Essential (primary) hypertension: Secondary | ICD-10-CM | POA: Diagnosis not present

## 2023-07-19 DIAGNOSIS — G894 Chronic pain syndrome: Secondary | ICD-10-CM | POA: Diagnosis not present

## 2023-07-19 DIAGNOSIS — L03116 Cellulitis of left lower limb: Secondary | ICD-10-CM | POA: Diagnosis not present

## 2023-07-19 DIAGNOSIS — K219 Gastro-esophageal reflux disease without esophagitis: Secondary | ICD-10-CM | POA: Diagnosis not present

## 2023-07-19 DIAGNOSIS — L089 Local infection of the skin and subcutaneous tissue, unspecified: Secondary | ICD-10-CM | POA: Diagnosis not present

## 2023-07-19 DIAGNOSIS — L03115 Cellulitis of right lower limb: Secondary | ICD-10-CM | POA: Diagnosis not present

## 2023-07-20 DIAGNOSIS — M5459 Other low back pain: Secondary | ICD-10-CM | POA: Diagnosis not present

## 2023-07-20 DIAGNOSIS — Z79891 Long term (current) use of opiate analgesic: Secondary | ICD-10-CM | POA: Diagnosis not present

## 2023-07-20 DIAGNOSIS — Z5181 Encounter for therapeutic drug level monitoring: Secondary | ICD-10-CM | POA: Diagnosis not present

## 2023-07-20 DIAGNOSIS — G894 Chronic pain syndrome: Secondary | ICD-10-CM | POA: Diagnosis not present

## 2023-07-20 DIAGNOSIS — M542 Cervicalgia: Secondary | ICD-10-CM | POA: Diagnosis not present

## 2023-07-25 DIAGNOSIS — L03115 Cellulitis of right lower limb: Secondary | ICD-10-CM | POA: Diagnosis not present

## 2023-07-25 DIAGNOSIS — L089 Local infection of the skin and subcutaneous tissue, unspecified: Secondary | ICD-10-CM | POA: Diagnosis not present

## 2023-07-25 DIAGNOSIS — K219 Gastro-esophageal reflux disease without esophagitis: Secondary | ICD-10-CM | POA: Diagnosis not present

## 2023-07-25 DIAGNOSIS — G894 Chronic pain syndrome: Secondary | ICD-10-CM | POA: Diagnosis not present

## 2023-07-25 DIAGNOSIS — L03116 Cellulitis of left lower limb: Secondary | ICD-10-CM | POA: Diagnosis not present

## 2023-07-25 DIAGNOSIS — I10 Essential (primary) hypertension: Secondary | ICD-10-CM | POA: Diagnosis not present

## 2023-07-28 DIAGNOSIS — L089 Local infection of the skin and subcutaneous tissue, unspecified: Secondary | ICD-10-CM | POA: Diagnosis not present

## 2023-07-28 DIAGNOSIS — I10 Essential (primary) hypertension: Secondary | ICD-10-CM | POA: Diagnosis not present

## 2023-07-28 DIAGNOSIS — K219 Gastro-esophageal reflux disease without esophagitis: Secondary | ICD-10-CM | POA: Diagnosis not present

## 2023-07-28 DIAGNOSIS — L03115 Cellulitis of right lower limb: Secondary | ICD-10-CM | POA: Diagnosis not present

## 2023-07-28 DIAGNOSIS — G894 Chronic pain syndrome: Secondary | ICD-10-CM | POA: Diagnosis not present

## 2023-07-28 DIAGNOSIS — L03116 Cellulitis of left lower limb: Secondary | ICD-10-CM | POA: Diagnosis not present

## 2023-07-31 ENCOUNTER — Telehealth (HOSPITAL_COMMUNITY): Payer: Self-pay | Admitting: *Deleted

## 2023-07-31 NOTE — Telephone Encounter (Signed)
 Attempted to call patient regarding upcoming cardiac PET appointment. Left message on voicemail with name and callback number  Larey Brick RN Navigator Cardiac Imaging [facility] [phone] Office [phone] Cell  Reminder to avoid caffeine and Imdur prior to appt.

## 2023-08-01 ENCOUNTER — Encounter (HOSPITAL_COMMUNITY): Admission: RE | Admit: 2023-08-01 | Payer: Medicare Other | Source: Ambulatory Visit

## 2023-08-01 ENCOUNTER — Other Ambulatory Visit: Payer: Self-pay | Admitting: Cardiology

## 2023-08-01 DIAGNOSIS — K219 Gastro-esophageal reflux disease without esophagitis: Secondary | ICD-10-CM | POA: Diagnosis not present

## 2023-08-01 DIAGNOSIS — I10 Essential (primary) hypertension: Secondary | ICD-10-CM | POA: Diagnosis not present

## 2023-08-01 DIAGNOSIS — G894 Chronic pain syndrome: Secondary | ICD-10-CM | POA: Diagnosis not present

## 2023-08-01 DIAGNOSIS — L03115 Cellulitis of right lower limb: Secondary | ICD-10-CM | POA: Diagnosis not present

## 2023-08-01 DIAGNOSIS — L03116 Cellulitis of left lower limb: Secondary | ICD-10-CM | POA: Diagnosis not present

## 2023-08-01 DIAGNOSIS — L089 Local infection of the skin and subcutaneous tissue, unspecified: Secondary | ICD-10-CM | POA: Diagnosis not present

## 2023-08-04 DIAGNOSIS — Z9089 Acquired absence of other organs: Secondary | ICD-10-CM | POA: Diagnosis not present

## 2023-08-04 DIAGNOSIS — K219 Gastro-esophageal reflux disease without esophagitis: Secondary | ICD-10-CM | POA: Diagnosis not present

## 2023-08-04 DIAGNOSIS — L03115 Cellulitis of right lower limb: Secondary | ICD-10-CM | POA: Diagnosis not present

## 2023-08-04 DIAGNOSIS — G894 Chronic pain syndrome: Secondary | ICD-10-CM | POA: Diagnosis not present

## 2023-08-04 DIAGNOSIS — L089 Local infection of the skin and subcutaneous tissue, unspecified: Secondary | ICD-10-CM | POA: Diagnosis not present

## 2023-08-04 DIAGNOSIS — I10 Essential (primary) hypertension: Secondary | ICD-10-CM | POA: Diagnosis not present

## 2023-08-04 DIAGNOSIS — Z556 Problems related to health literacy: Secondary | ICD-10-CM | POA: Diagnosis not present

## 2023-08-04 DIAGNOSIS — E282 Polycystic ovarian syndrome: Secondary | ICD-10-CM | POA: Diagnosis not present

## 2023-08-04 DIAGNOSIS — Z7984 Long term (current) use of oral hypoglycemic drugs: Secondary | ICD-10-CM | POA: Diagnosis not present

## 2023-08-04 DIAGNOSIS — Z87891 Personal history of nicotine dependence: Secondary | ICD-10-CM | POA: Diagnosis not present

## 2023-08-04 DIAGNOSIS — Z79891 Long term (current) use of opiate analgesic: Secondary | ICD-10-CM | POA: Diagnosis not present

## 2023-08-04 DIAGNOSIS — L03116 Cellulitis of left lower limb: Secondary | ICD-10-CM | POA: Diagnosis not present

## 2023-08-04 DIAGNOSIS — M1990 Unspecified osteoarthritis, unspecified site: Secondary | ICD-10-CM | POA: Diagnosis not present

## 2023-08-04 DIAGNOSIS — Z7902 Long term (current) use of antithrombotics/antiplatelets: Secondary | ICD-10-CM | POA: Diagnosis not present

## 2023-08-08 DIAGNOSIS — L089 Local infection of the skin and subcutaneous tissue, unspecified: Secondary | ICD-10-CM | POA: Diagnosis not present

## 2023-08-08 DIAGNOSIS — I10 Essential (primary) hypertension: Secondary | ICD-10-CM | POA: Diagnosis not present

## 2023-08-08 DIAGNOSIS — L03115 Cellulitis of right lower limb: Secondary | ICD-10-CM | POA: Diagnosis not present

## 2023-08-08 DIAGNOSIS — G894 Chronic pain syndrome: Secondary | ICD-10-CM | POA: Diagnosis not present

## 2023-08-08 DIAGNOSIS — L03116 Cellulitis of left lower limb: Secondary | ICD-10-CM | POA: Diagnosis not present

## 2023-08-08 DIAGNOSIS — K219 Gastro-esophageal reflux disease without esophagitis: Secondary | ICD-10-CM | POA: Diagnosis not present

## 2023-08-15 DIAGNOSIS — L03116 Cellulitis of left lower limb: Secondary | ICD-10-CM | POA: Diagnosis not present

## 2023-08-15 DIAGNOSIS — K219 Gastro-esophageal reflux disease without esophagitis: Secondary | ICD-10-CM | POA: Diagnosis not present

## 2023-08-15 DIAGNOSIS — I10 Essential (primary) hypertension: Secondary | ICD-10-CM | POA: Diagnosis not present

## 2023-08-15 DIAGNOSIS — L03115 Cellulitis of right lower limb: Secondary | ICD-10-CM | POA: Diagnosis not present

## 2023-08-15 DIAGNOSIS — G894 Chronic pain syndrome: Secondary | ICD-10-CM | POA: Diagnosis not present

## 2023-08-15 DIAGNOSIS — L089 Local infection of the skin and subcutaneous tissue, unspecified: Secondary | ICD-10-CM | POA: Diagnosis not present

## 2023-08-17 DIAGNOSIS — M5459 Other low back pain: Secondary | ICD-10-CM | POA: Diagnosis not present

## 2023-08-17 DIAGNOSIS — Z79891 Long term (current) use of opiate analgesic: Secondary | ICD-10-CM | POA: Diagnosis not present

## 2023-08-17 DIAGNOSIS — Z5181 Encounter for therapeutic drug level monitoring: Secondary | ICD-10-CM | POA: Diagnosis not present

## 2023-08-17 DIAGNOSIS — G894 Chronic pain syndrome: Secondary | ICD-10-CM | POA: Diagnosis not present

## 2023-08-17 DIAGNOSIS — M1990 Unspecified osteoarthritis, unspecified site: Secondary | ICD-10-CM | POA: Diagnosis not present

## 2023-08-28 ENCOUNTER — Ambulatory Visit: Payer: Medicare Other | Admitting: Cardiology

## 2023-08-30 DIAGNOSIS — K219 Gastro-esophageal reflux disease without esophagitis: Secondary | ICD-10-CM | POA: Diagnosis not present

## 2023-08-30 DIAGNOSIS — G894 Chronic pain syndrome: Secondary | ICD-10-CM | POA: Diagnosis not present

## 2023-08-30 DIAGNOSIS — L03116 Cellulitis of left lower limb: Secondary | ICD-10-CM | POA: Diagnosis not present

## 2023-08-30 DIAGNOSIS — L089 Local infection of the skin and subcutaneous tissue, unspecified: Secondary | ICD-10-CM | POA: Diagnosis not present

## 2023-08-30 DIAGNOSIS — I10 Essential (primary) hypertension: Secondary | ICD-10-CM | POA: Diagnosis not present

## 2023-08-30 DIAGNOSIS — L03115 Cellulitis of right lower limb: Secondary | ICD-10-CM | POA: Diagnosis not present

## 2023-09-14 DIAGNOSIS — Z5181 Encounter for therapeutic drug level monitoring: Secondary | ICD-10-CM | POA: Diagnosis not present

## 2023-09-14 DIAGNOSIS — M542 Cervicalgia: Secondary | ICD-10-CM | POA: Diagnosis not present

## 2023-09-14 DIAGNOSIS — G894 Chronic pain syndrome: Secondary | ICD-10-CM | POA: Diagnosis not present

## 2023-09-14 DIAGNOSIS — Z79891 Long term (current) use of opiate analgesic: Secondary | ICD-10-CM | POA: Diagnosis not present

## 2023-09-14 DIAGNOSIS — M51362 Other intervertebral disc degeneration, lumbar region with discogenic back pain and lower extremity pain: Secondary | ICD-10-CM | POA: Diagnosis not present

## 2023-09-14 DIAGNOSIS — M5459 Other low back pain: Secondary | ICD-10-CM | POA: Diagnosis not present

## 2023-09-20 DIAGNOSIS — G894 Chronic pain syndrome: Secondary | ICD-10-CM | POA: Diagnosis not present

## 2023-09-20 DIAGNOSIS — Z79891 Long term (current) use of opiate analgesic: Secondary | ICD-10-CM | POA: Diagnosis not present

## 2023-09-20 DIAGNOSIS — Z5181 Encounter for therapeutic drug level monitoring: Secondary | ICD-10-CM | POA: Diagnosis not present

## 2023-09-20 DIAGNOSIS — M5459 Other low back pain: Secondary | ICD-10-CM | POA: Diagnosis not present

## 2023-10-12 DIAGNOSIS — G894 Chronic pain syndrome: Secondary | ICD-10-CM | POA: Diagnosis not present

## 2023-10-12 DIAGNOSIS — M5459 Other low back pain: Secondary | ICD-10-CM | POA: Diagnosis not present

## 2023-10-12 DIAGNOSIS — M542 Cervicalgia: Secondary | ICD-10-CM | POA: Diagnosis not present

## 2023-10-12 DIAGNOSIS — Z79891 Long term (current) use of opiate analgesic: Secondary | ICD-10-CM | POA: Diagnosis not present

## 2023-10-12 DIAGNOSIS — G629 Polyneuropathy, unspecified: Secondary | ICD-10-CM | POA: Diagnosis not present

## 2023-10-12 DIAGNOSIS — M51362 Other intervertebral disc degeneration, lumbar region with discogenic back pain and lower extremity pain: Secondary | ICD-10-CM | POA: Diagnosis not present

## 2023-10-12 DIAGNOSIS — Z5181 Encounter for therapeutic drug level monitoring: Secondary | ICD-10-CM | POA: Diagnosis not present

## 2023-11-09 DIAGNOSIS — Z79891 Long term (current) use of opiate analgesic: Secondary | ICD-10-CM | POA: Diagnosis not present

## 2023-11-09 DIAGNOSIS — F3289 Other specified depressive episodes: Secondary | ICD-10-CM | POA: Diagnosis not present

## 2023-11-09 DIAGNOSIS — F418 Other specified anxiety disorders: Secondary | ICD-10-CM | POA: Diagnosis not present

## 2023-11-09 DIAGNOSIS — M542 Cervicalgia: Secondary | ICD-10-CM | POA: Diagnosis not present

## 2023-11-09 DIAGNOSIS — M51362 Other intervertebral disc degeneration, lumbar region with discogenic back pain and lower extremity pain: Secondary | ICD-10-CM | POA: Diagnosis not present

## 2023-11-09 DIAGNOSIS — Z5181 Encounter for therapeutic drug level monitoring: Secondary | ICD-10-CM | POA: Diagnosis not present

## 2023-11-09 DIAGNOSIS — F5101 Primary insomnia: Secondary | ICD-10-CM | POA: Diagnosis not present

## 2023-11-09 DIAGNOSIS — G894 Chronic pain syndrome: Secondary | ICD-10-CM | POA: Diagnosis not present

## 2023-11-09 DIAGNOSIS — F4542 Pain disorder with related psychological factors: Secondary | ICD-10-CM | POA: Diagnosis not present

## 2023-11-09 DIAGNOSIS — M5459 Other low back pain: Secondary | ICD-10-CM | POA: Diagnosis not present

## 2023-11-13 ENCOUNTER — Other Ambulatory Visit (HOSPITAL_COMMUNITY): Payer: Self-pay | Admitting: Cardiology

## 2023-11-15 ENCOUNTER — Ambulatory Visit (HOSPITAL_COMMUNITY): Admission: RE | Admit: 2023-11-15 | Source: Ambulatory Visit

## 2023-11-22 ENCOUNTER — Encounter (HOSPITAL_COMMUNITY): Payer: Self-pay | Admitting: Cardiology

## 2023-12-07 DIAGNOSIS — F4542 Pain disorder with related psychological factors: Secondary | ICD-10-CM | POA: Diagnosis not present

## 2023-12-07 DIAGNOSIS — Z5181 Encounter for therapeutic drug level monitoring: Secondary | ICD-10-CM | POA: Diagnosis not present

## 2023-12-07 DIAGNOSIS — M961 Postlaminectomy syndrome, not elsewhere classified: Secondary | ICD-10-CM | POA: Diagnosis not present

## 2023-12-07 DIAGNOSIS — M51362 Other intervertebral disc degeneration, lumbar region with discogenic back pain and lower extremity pain: Secondary | ICD-10-CM | POA: Diagnosis not present

## 2023-12-07 DIAGNOSIS — M5459 Other low back pain: Secondary | ICD-10-CM | POA: Diagnosis not present

## 2023-12-07 DIAGNOSIS — F3289 Other specified depressive episodes: Secondary | ICD-10-CM | POA: Diagnosis not present

## 2023-12-07 DIAGNOSIS — F5101 Primary insomnia: Secondary | ICD-10-CM | POA: Diagnosis not present

## 2023-12-07 DIAGNOSIS — M542 Cervicalgia: Secondary | ICD-10-CM | POA: Diagnosis not present

## 2023-12-07 DIAGNOSIS — Z79891 Long term (current) use of opiate analgesic: Secondary | ICD-10-CM | POA: Diagnosis not present

## 2023-12-07 DIAGNOSIS — F418 Other specified anxiety disorders: Secondary | ICD-10-CM | POA: Diagnosis not present

## 2023-12-07 DIAGNOSIS — G894 Chronic pain syndrome: Secondary | ICD-10-CM | POA: Diagnosis not present

## 2023-12-15 DIAGNOSIS — G894 Chronic pain syndrome: Secondary | ICD-10-CM | POA: Diagnosis not present

## 2023-12-15 DIAGNOSIS — Z5181 Encounter for therapeutic drug level monitoring: Secondary | ICD-10-CM | POA: Diagnosis not present

## 2023-12-15 DIAGNOSIS — Z79891 Long term (current) use of opiate analgesic: Secondary | ICD-10-CM | POA: Diagnosis not present

## 2023-12-15 DIAGNOSIS — M542 Cervicalgia: Secondary | ICD-10-CM | POA: Diagnosis not present

## 2024-01-04 DIAGNOSIS — M961 Postlaminectomy syndrome, not elsewhere classified: Secondary | ICD-10-CM | POA: Diagnosis not present

## 2024-01-04 DIAGNOSIS — G894 Chronic pain syndrome: Secondary | ICD-10-CM | POA: Diagnosis not present

## 2024-01-04 DIAGNOSIS — M542 Cervicalgia: Secondary | ICD-10-CM | POA: Diagnosis not present

## 2024-01-04 DIAGNOSIS — M5459 Other low back pain: Secondary | ICD-10-CM | POA: Diagnosis not present

## 2024-01-04 DIAGNOSIS — Z5181 Encounter for therapeutic drug level monitoring: Secondary | ICD-10-CM | POA: Diagnosis not present

## 2024-01-04 DIAGNOSIS — M51362 Other intervertebral disc degeneration, lumbar region with discogenic back pain and lower extremity pain: Secondary | ICD-10-CM | POA: Diagnosis not present

## 2024-01-04 DIAGNOSIS — Z79891 Long term (current) use of opiate analgesic: Secondary | ICD-10-CM | POA: Diagnosis not present

## 2024-01-04 DIAGNOSIS — G629 Polyneuropathy, unspecified: Secondary | ICD-10-CM | POA: Diagnosis not present

## 2024-02-23 ENCOUNTER — Other Ambulatory Visit: Payer: Self-pay | Admitting: Cardiology

## 2024-03-22 ENCOUNTER — Other Ambulatory Visit: Payer: Self-pay | Admitting: Cardiology

## 2024-04-13 ENCOUNTER — Other Ambulatory Visit: Payer: Self-pay | Admitting: Cardiology

## 2024-04-28 ENCOUNTER — Other Ambulatory Visit: Payer: Self-pay | Admitting: Cardiology
